# Patient Record
Sex: Male | Born: 1970 | Race: White | Hispanic: No | Marital: Married | Smoking: Never smoker
Health system: Southern US, Community
[De-identification: ages and names within clinical notes are randomized; demographics above are authoritative.]

## PROBLEM LIST (undated history)

## (undated) DIAGNOSIS — E785 Hyperlipidemia, unspecified: Secondary | ICD-10-CM

## (undated) DIAGNOSIS — J301 Allergic rhinitis due to pollen: Secondary | ICD-10-CM

## (undated) DIAGNOSIS — E119 Type 2 diabetes mellitus without complications: Secondary | ICD-10-CM

## (undated) DIAGNOSIS — J302 Other seasonal allergic rhinitis: Secondary | ICD-10-CM

## (undated) DIAGNOSIS — M1990 Unspecified osteoarthritis, unspecified site: Secondary | ICD-10-CM

## (undated) HISTORY — DX: Type 2 diabetes mellitus without complications: E11.9

## (undated) HISTORY — DX: Hyperlipidemia, unspecified: E78.5

## (undated) HISTORY — DX: Unspecified osteoarthritis, unspecified site: M19.90

## (undated) HISTORY — DX: Other seasonal allergic rhinitis: J30.2

## (undated) HISTORY — PX: FINGER SURGERY: SHX640

## (undated) HISTORY — DX: Allergic rhinitis due to pollen: J30.1

---

## 1998-06-09 ENCOUNTER — Encounter: Payer: Self-pay | Admitting: Emergency Medicine

## 1998-06-09 ENCOUNTER — Emergency Department (HOSPITAL_COMMUNITY): Admission: EM | Admit: 1998-06-09 | Discharge: 1998-06-09 | Payer: Self-pay | Admitting: Emergency Medicine

## 2003-04-06 HISTORY — PX: KNEE SURGERY: SHX244

## 2009-08-18 ENCOUNTER — Ambulatory Visit: Payer: Self-pay | Admitting: Family Medicine

## 2009-08-18 DIAGNOSIS — E1169 Type 2 diabetes mellitus with other specified complication: Secondary | ICD-10-CM | POA: Insufficient documentation

## 2009-08-18 DIAGNOSIS — E119 Type 2 diabetes mellitus without complications: Secondary | ICD-10-CM

## 2009-08-18 DIAGNOSIS — E785 Hyperlipidemia, unspecified: Secondary | ICD-10-CM

## 2009-08-18 DIAGNOSIS — E1165 Type 2 diabetes mellitus with hyperglycemia: Secondary | ICD-10-CM | POA: Insufficient documentation

## 2009-08-18 DIAGNOSIS — IMO0002 Reserved for concepts with insufficient information to code with codable children: Secondary | ICD-10-CM | POA: Insufficient documentation

## 2009-08-18 HISTORY — DX: Hyperlipidemia, unspecified: E78.5

## 2009-08-18 HISTORY — DX: Type 2 diabetes mellitus without complications: E11.9

## 2009-08-18 LAB — CONVERTED CEMR LAB: Blood Glucose, Fingerstick: 199

## 2009-08-22 LAB — CONVERTED CEMR LAB
ALT: 28 units/L (ref 0–53)
AST: 22 units/L (ref 0–37)
Albumin: 4.2 g/dL (ref 3.5–5.2)
Alkaline Phosphatase: 54 units/L (ref 39–117)
Bilirubin, Direct: 0.1 mg/dL (ref 0.0–0.3)
Cholesterol: 173 mg/dL (ref 0–200)
Creatinine,U: 175.7 mg/dL
Direct LDL: 95 mg/dL
HDL: 41.3 mg/dL (ref >39.00)
Hgb A1c MFr Bld: 10.1 % — ABNORMAL HIGH (ref 4.6–6.5)
Microalb Creat Ratio: 0.9 mg/g (ref 0.0–30.0)
Microalb, Ur: 1.5 mg/dL (ref 0.0–1.9)
Total Bilirubin: 0.8 mg/dL (ref 0.3–1.2)
Total CHOL/HDL Ratio: 4
Total Protein: 6.9 g/dL (ref 6.0–8.3)
Triglycerides: 300 mg/dL — ABNORMAL HIGH (ref 0.0–149.0)
VLDL: 60 mg/dL — ABNORMAL HIGH (ref 0.0–40.0)

## 2009-08-29 ENCOUNTER — Ambulatory Visit: Payer: Self-pay | Admitting: Family Medicine

## 2009-08-29 DIAGNOSIS — J019 Acute sinusitis, unspecified: Secondary | ICD-10-CM | POA: Insufficient documentation

## 2009-09-15 ENCOUNTER — Telehealth: Payer: Self-pay | Admitting: Family Medicine

## 2009-10-31 ENCOUNTER — Ambulatory Visit: Payer: Self-pay | Admitting: Family Medicine

## 2009-10-31 LAB — CONVERTED CEMR LAB
Cholesterol, target level: 200 mg/dL
HDL goal, serum: 40 mg/dL
Hgb A1c MFr Bld: 9.9 % — ABNORMAL HIGH (ref 4.6–6.5)
LDL Goal: 100 mg/dL

## 2009-11-10 ENCOUNTER — Telehealth: Payer: Self-pay | Admitting: Family Medicine

## 2010-02-02 ENCOUNTER — Ambulatory Visit: Payer: Self-pay | Admitting: Family Medicine

## 2010-03-02 ENCOUNTER — Telehealth: Payer: Self-pay | Admitting: Family Medicine

## 2010-04-21 ENCOUNTER — Encounter: Payer: Self-pay | Admitting: Family Medicine

## 2010-04-28 ENCOUNTER — Encounter: Payer: Self-pay | Admitting: Family Medicine

## 2010-05-02 ENCOUNTER — Encounter: Payer: Self-pay | Admitting: Family Medicine

## 2010-05-07 NOTE — Assessment & Plan Note (Signed)
Summary: SINUSITIS? // RS   Vital Signs:  Patient profile:   40 year old male Temp:     98.8 degrees F oral BP sitting:   120 / 78  (left arm) Cuff size:   regular  Vitals Entered By: Sid Falcon LPN (Aug 29, 2009 1:35 PM) CC: headache, congestion, eye pressure   History of Present Illness: Almost two-week history of intermittent sinus congestion, nasal discharge with recent discoloration of greenish mucus and intermittent headaches. He's had mild cough. No sore throat or fever. Taking Mucinex D without much improvement. History of type 2 diabetes but not monitoring blood sugars regularly.  Allergies (verified): No Known Drug Allergies  Past History:  Past Medical History: Last updated: 08/18/2009 Arthritis Diabetes Type II Hayfever, allergies Hyperlipidemia  Review of Systems      See HPI  Physical Exam  General:  Well-developed,well-nourished,in no acute distress; alert,appropriate and cooperative throughout examination Ears:  External ear exam shows no significant lesions or deformities.  Otoscopic examination reveals clear canals, tympanic membranes are intact bilaterally without bulging, retraction, inflammation or discharge. Hearing is grossly normal bilaterally. Nose:  External nasal examination shows no deformity or inflammation. Nasal mucosa are pink and moist without lesions or exudates. Mouth:  Oral mucosa and oropharynx without lesions or exudates.  Teeth in good repair. Neck:  No deformities, masses, or tenderness noted. Lungs:  Normal respiratory effort, chest expands symmetrically. Lungs are clear to auscultation, no crackles or wheezes. Heart:  Normal rate and regular rhythm. S1 and S2 normal without gallop, murmur, click, rub or other extra sounds.   Impression & Recommendations:  Problem # 1:  SINUSITIS, ACUTE (ICD-461.9) Assessment New  His updated medication list for this problem includes:    Amoxicillin 875 Mg Tabs (Amoxicillin) ..... One by  mouth two times a day for 10 days  Complete Medication List: 1)  Simvastatin 80 Mg Tabs (Simvastatin) .... Once daily 2)  Metformin Hcl 500 Mg Tabs (Metformin hcl) .... Two tabs two times a day with meals 3)  One-a-day Mens Tabs (Multiple vitamin) .... Once daily 4)  Amoxicillin 875 Mg Tabs (Amoxicillin) .... One by mouth two times a day for 10 days  Patient Instructions: 1)  Acute sinusitis symptoms for less than 10 days are not helped by antibiotics. Use warm moist compresses, and over the counter decongestants( only as directed). Call if no improvement in 5-7 days, sooner if increasing pain, fever, or new symptoms.  2)  Please schedule a follow-up appointment in 2 months.  Prescriptions: AMOXICILLIN 875 MG TABS (AMOXICILLIN) one by mouth two times a day for 10 days  #20 x 0   Entered and Authorized by:   Evelena Peat MD   Signed by:   Evelena Peat MD on 08/29/2009   Method used:   Electronically to        [facility]  [phone]* (retail)       [address]       [place], [place]  [address]       Ph: [phone] or [phone]       Fax: [phone]   RxID:   [id number]

## 2010-05-07 NOTE — Progress Notes (Signed)
Summary: refill Glimepiride X 1 year  Phone Note Refill Request Message from:  Patient---live call  Refills Requested: Medication #1:  GLIMEPIRIDE 2 MG TABS once daily send to [facility] [facility]  Initial call taken by: Warnell Forester,  March 02, 2010 9:12 AM    Prescriptions: GLIMEPIRIDE 2 MG TABS (GLIMEPIRIDE) once daily  #90 x 3   Entered by:   Sid Falcon LPN   Authorized by:   Evelena Peat MD   Signed by:   Sid Falcon LPN on 27/25/3664   Method used:   Electronically to        [facility] [address] (retail)       [address], [address]       [place], [place]  [address]       Ph: [phone]       Fax: [phone]   RxID:   [id number]

## 2010-05-07 NOTE — Progress Notes (Signed)
Summary: refill Metformin, pharmacy never received 6/13  Phone Note Refill Request Call back at [phone] Message from:  spouse----live call  Refills Requested: Medication #1:  METFORMIN HCL 500 MG TABS two tabs two times a day with meals pharmacy never received rx, according to wife.  Initial call taken by: Warnell Forester,  November 10, 2009 1:29 PM  Follow-up for Phone Call        Refill sent, wife informed Follow-up by: Sid Falcon LPN,  November 10, 2009 2:49 PM    Prescriptions: METFORMIN HCL 500 MG TABS (METFORMIN HCL) two tabs two times a day with meals  #120 x 3   Entered by:   Sid Falcon LPN   Authorized by:   Evelena Peat MD   Signed by:   Sid Falcon LPN on 47/82/9562   Method used:   Electronically to        [facility]  [phone]* (retail)       [address]       [place], [place]  [address]       Ph: [phone] or [phone]       Fax: [phone]   RxID:   [id number]

## 2010-05-07 NOTE — Assessment & Plan Note (Signed)
Summary: 3 MO/ROV/cb   Vital Signs:  Patient profile:   40 year old male Height:      67.75 inches Weight:      236 pounds Temp:     98.6 degrees F oral BP sitting:   100 / 72  (left arm) Cuff size:   regular  Vitals Entered By: Sid Falcon LPN (February 02, 2010 11:21 AM)  History of Present Illness: Here for follow up Type 2 diabetes.   Meds reviewed.  Hx poor control.  On metformin and Amaryl.  Diabetes Management History:      He has not been enrolled in the "Diabetic Education Program".  He states understanding of dietary principles but he is not following the appropriate diet.  No sensory loss is reported.  Self foot exams are being performed.  He is not checking home blood sugars.  He says that he is not exercising regularly.        Hypoglycemic symptoms are not occurring.  No hyperglycemic symptoms are reported.    Lipid Management History:      Positive NCEP/ATP III risk factors include diabetes and hypertension.  Negative NCEP/ATP III risk factors include male age less than 70 years old, no family history for ischemic heart disease, non-tobacco-user status, no ASHD (atherosclerotic heart disease), no prior stroke/TIA, no peripheral vascular disease, and no history of aortic aneurysm.     Allergies (verified): No Known Drug Allergies  Past History:  Past Medical History: Last updated: 08/18/2009 Arthritis Diabetes Type II Hayfever, allergies Hyperlipidemia  Past Surgical History: Last updated: 08/18/2009 Right knee surgery 2005  Family History: Last updated: 08/18/2009 Family History of Alcoholism/Addiction, Maternal Grandfather Father, prostate cancer, heart disease 5s Mother, high cholesterol, stroke, hypertension, diabetes maternal grandmother, diabetes Aunt, breast cancer, diabetes  Social History: Last updated: 08/18/2009 Occupation: Food Sport and exercise psychologist Married Never Smoked Alcohol use-yes Regular exercise-no Chews tobacco 1 can 2  days  Risk Factors: Exercise: no (08/18/2009)  Risk Factors: Smoking Status: never (08/18/2009)  Review of Systems  The patient denies anorexia, fever, weight loss, chest pain, syncope, dyspnea on exertion, peripheral edema, headaches, and abdominal pain.    Physical Exam  General:  Well-developed,well-nourished,in no acute distress; alert,appropriate and cooperative throughout examination Eyes:  No corneal or conjunctival inflammation noted. EOMI. Perrla. Funduscopic exam benign, without hemorrhages, exudates or papilledema. Vision grossly normal. Ears:  External ear exam shows no significant lesions or deformities.  Otoscopic examination reveals clear canals, tympanic membranes are intact bilaterally without bulging, retraction, inflammation or discharge. Hearing is grossly normal bilaterally. Mouth:  Oral mucosa and oropharynx without lesions or exudates.  Teeth in good repair. Neck:  No deformities, masses, or tenderness noted. Lungs:  Normal respiratory effort, chest expands symmetrically. Lungs are clear to auscultation, no crackles or wheezes. Heart:  Normal rate and regular rhythm. S1 and S2 normal without gallop, murmur, click, rub or other extra sounds. Extremities:  No clubbing, cyanosis, edema, or deformity noted with normal full range of motion of all joints.    Diabetes Management Exam:    Foot Exam (with socks and/or shoes not present):       Sensory-Pinprick/Light touch:          Left medial foot (L-4): normal          Left dorsal foot (L-5): normal          Left lateral foot (S-1): normal          Right medial foot (L-4): normal  Right dorsal foot (L-5): normal          Right lateral foot (S-1): normal       Sensory-Monofilament:          Left foot: normal          Right foot: normal       Inspection:          Left foot: normal          Right foot: normal       Nails:          Left foot: normal          Right foot: normal    Eye Exam:       Eye Exam  done here today          Results: normal   Impression & Recommendations:  Problem # 1:  DIABETES MELLITUS, TYPE II (ICD-250.00)  His updated medication list for this problem includes:    Metformin Hcl 500 Mg Tabs (Metformin hcl) .Marland Kitchen..Marland Kitchen Two tabs two times a day with meals    Glimepiride 2 Mg Tabs (Glimepiride) ..... Once daily  Orders: Specimen Handling ([address]) Venipuncture ([address]) TLB-A1C / Hgb A1C (Glycohemoglobin) (83036-A1C)  Problem # 2:  HYPERLIPIDEMIA (ICD-272.4)  His updated medication list for this problem includes:    Simvastatin 80 Mg Tabs (Simvastatin) ..... Once daily  Complete Medication List: 1)  Simvastatin 80 Mg Tabs (Simvastatin) .... Once daily 2)  Metformin Hcl 500 Mg Tabs (Metformin hcl) .... Two tabs two times a day with meals 3)  One-a-day Mens Tabs (Multiple vitamin) .... Once daily 4)  Glimepiride 2 Mg Tabs (Glimepiride) .... Once daily 5)  Accu-chek Aviva Strp (Glucose blood) .... Use daily as directed  Diabetes Management Assessment/Plan:      The following lipid goals have been established for the patient: Total cholesterol goal of 200; LDL cholesterol goal of 100; HDL cholesterol goal of 40; Triglyceride goal of 150.  His blood pressure goal is < 130/80.    Lipid Assessment/Plan:      Based on NCEP/ATP III, the patient's risk factor category is "history of diabetes".  The patient's lipid goals are as follows: Total cholesterol goal is 200; LDL cholesterol goal is 100; HDL cholesterol goal is 40; Triglyceride goal is 150.    Patient Instructions: 1)  It is important that you exercise reguarly at least 20 minutes 5 times a week. If you develop chest pain, have severe difficulty breathing, or feel very tired, stop exercising immediately and seek medical attention.  2)  You need to lose weight. Consider a lower calorie diet and regular exercise.  3)  Check your blood sugars regularly. If your readings are usually above:  or below 70 you should contact our  office.  4)  It is important that your diabetic A1c level is checked every 3 months.  5)  See your eye doctor yearly to check for diabetic eye damage. 6)  Check your feet each night  for sore areas, calluses or signs of infection.  Prescriptions: ACCU-CHEK AVIVA  STRP (GLUCOSE BLOOD) use daily as directed  #50 x 3   Entered and Authorized by:   Evelena Peat MD   Signed by:   Evelena Peat MD on 02/02/2010   Method used:   [facility] to        [facility] [address] (retail)       [address]       [place]  [place], [place]  [address]       Ph: [phone]       Fax: [phone]   RxID:   [id number] SIMVASTATIN 80 MG TABS (SIMVASTATIN) once daily  #30 x 11   Entered and Authorized by:   Evelena Peat MD   Signed by:   Evelena Peat MD on 02/02/2010   Method used:   [facility] to        [facility] [address] (retail)       [address], [address]       [place], [place]  [address]       Ph: [phone]       Fax: [phone]   RxID:   [id number]    Orders Added: 1)  Specimen Handling [99000] 2)  Venipuncture [36415] 3)  TLB-A1C / Hgb A1C (Glycohemoglobin) [83036-A1C] 4)  Est. Patient Level IV [88416]

## 2010-05-07 NOTE — Assessment & Plan Note (Signed)
Summary: BRAND NEW PT/TO EST/CJR WIFE RSC/NJR   Vital Signs:  Patient profile:   40 year old male Height:      67.75 inches Weight:      231 pounds BMI:     35.51 Temp:     98.0 degrees F oral Pulse rate:   80 / minute Pulse rhythm:   regular Resp:     12 per minute BP sitting:   110 / 82  (left arm) Cuff size:   regular  Vitals Entered By: Sid Falcon LPN (Aug 18, 2009 2:10 PM)  Nutrition Counseling: Patient's BMI is greater than 25 and therefore counseled on weight management options. CC: New to establish Is Patient Diabetic? Yes Did you bring your meter with you today? No CBG Result 199   History of Present Illness: New patient to establish care.  Patient has type 2 diabetes which he states was diagnosed over one year ago. Denies any recent followup. No recent A1c. Does not monitor blood sugars regularly at home. Has some urinary frequency. No polydipsia. Has lost some weight this past year due to his efforts. No recent eye exam.  History of hyperlipidemia treated with simvastatin. No side effects from medication. Compliant with medication.  Also reports prior history of elevated blood pressure but apparently never treated.  Family history significant for father prostate cancer history and coronary artery disease in his 42s. Mother had hyperlipidemia, hypertension, and history of stroke. Mother also has type 2 diabetes.  Patient is married 57-year-old daughter. Nonsmoker. No alcohol use. Works as a Corporate treasurer  Diabetes Management History:      He has not been enrolled in the "Diabetic Education Program".  He states understanding of dietary principles and is following his diet appropriately.  No sensory loss is reported.  Self foot exams are being performed.  He is not checking home blood sugars.  He says that he is not exercising regularly.        Hypoglycemic symptoms are not occurring.  Hyperglycemic symptoms include polyuria.    Preventive  Screening-Counseling & Management  Alcohol-Tobacco     Smoking Status: never  Caffeine-Diet-Exercise     Does Patient Exercise: no  Comments: Chews tobacco, 1 can every 2 days  Allergies (verified): No Known Drug Allergies  Past History:  Past Medical History: Arthritis Diabetes Type II Hayfever, allergies Hyperlipidemia  Past Surgical History: Right knee surgery 2005  Family History: Family History of Alcoholism/Addiction, Maternal Grandfather Father, prostate cancer, heart disease 75s Mother, high cholesterol, stroke, hypertension, diabetes maternal grandmother, diabetes Aunt, breast cancer, diabetes  Social History: Occupation: Public house manager Married Never Smoked Alcohol use-yes Regular exercise-no Chews tobacco 1 can 2 daysSmoking Status:  never Occupation:  employed Does Patient Exercise:  no  Review of Systems  The patient denies anorexia, fever, weight gain, chest pain, syncope, dyspnea on exertion, peripheral edema, prolonged cough, headaches, hemoptysis, abdominal pain, melena, hematochezia, and severe indigestion/heartburn.    Physical Exam  General:  Well-developed,well-nourished,in no acute distress; alert,appropriate and cooperative throughout examination Head:  Normocephalic and atraumatic without obvious abnormalities. No apparent alopecia or balding. Eyes:  No corneal or conjunctival inflammation noted. EOMI. Perrla. Funduscopic exam benign, without hemorrhages, exudates or papilledema. Vision grossly normal. Ears:  External ear exam shows no significant lesions or deformities.  Otoscopic examination reveals clear canals, tympanic membranes are intact bilaterally without bulging, retraction, inflammation or discharge. Hearing is grossly normal bilaterally. Mouth:  Oral mucosa and oropharynx without lesions or  exudates.  Teeth in good repair. Neck:  No deformities, masses, or tenderness noted. Lungs:  Normal respiratory effort, chest  expands symmetrically. Lungs are clear to auscultation, no crackles or wheezes. Heart:  Normal rate and regular rhythm. S1 and S2 normal without gallop, murmur, click, rub or other extra sounds. Extremities:  No clubbing, cyanosis, edema, or deformity noted with normal full range of motion of all joints.   Neurologic:  alert & oriented X3, cranial nerves II-XII intact, and strength normal in all extremities.    Diabetes Management Exam:    Foot Exam (with socks and/or shoes not present):       Sensory-Pinprick/Light touch:          Left medial foot (L-4): normal          Left dorsal foot (L-5): normal          Left lateral foot (S-1): normal          Right medial foot (L-4): normal          Right dorsal foot (L-5): normal          Right lateral foot (S-1): normal       Sensory-Monofilament:          Left foot: normal          Right foot: normal       Inspection:          Left foot: normal          Right foot: normal       Nails:          Left foot: normal          Right foot: normal    Eye Exam:       Eye Exam done here today   Impression & Recommendations:  Problem # 1:  DIABETES MELLITUS, TYPE II (ICD-250.00)  need to assess level of control. Fasting 199 here today.  Needs to establish regular eye exams. His updated medication list for this problem includes:    Metformin Hcl 500 Mg Tabs (Metformin hcl) ..... One tab two times a day with meals  Orders: Glucose, (CBG) (82962) TLB-A1C / Hgb A1C (Glycohemoglobin) (83036-A1C) TLB-Microalbumin/Creat Ratio, Urine (82043-MALB) Venipuncture (04540) Capillary Blood Glucose/CBG (98119)  Problem # 2:  HYPERLIPIDEMIA (ICD-272.4)  His updated medication list for this problem includes:    Simvastatin 80 Mg Tabs (Simvastatin) ..... Once daily  Orders: TLB-Lipid Panel (80061-LIPID) TLB-Hepatic/Liver Function Pnl (80076-HEPATIC) Venipuncture (14782)  Complete Medication List: 1)  Simvastatin 80 Mg Tabs (Simvastatin) .... Once  daily 2)  Metformin Hcl 500 Mg Tabs (Metformin hcl) .... One tab two times a day with meals 3)  One-a-day Mens Tabs (Multiple vitamin) .... Once daily  Patient Instructions: 1)  It is important that you exercise reguarly at least 20 minutes 5 times a week. If you develop chest pain, have severe difficulty breathing, or feel very tired, stop exercising immediately and seek medical attention.  2)  Check your blood sugars regularly. If your readings are usually above: 140 (fasting) or below 70 you should contact our office.  3)  It is important that your diabetic A1c level is checked every 3 months.  4)  See your eye doctor yearly to check for diabetic eye damage. 5)  Check your feet each night  for sore areas, calluses or signs of infection.  6)  Please schedule a follow-up appointment in 3 months .

## 2010-05-07 NOTE — Progress Notes (Signed)
Summary: Pt is out of Metformin from old script. Needs refill  Phone Note Call from Patient Call back at [phone] Cheri   Caller: spouse-Cheri Summary of Call: Pt has been using old script of Metformin from previous physician, Dr Clovis Riley, and is out of medication.  Dr Caryl Never increased the dosage of Metformin, but [facility] [address] will not refill until 09/16/09. Please call [facility] and get med refilled.     Initial call taken by: Lucy Antigua,  September 15, 2009 8:15 AM    Prescriptions: METFORMIN HCL 500 MG TABS (METFORMIN HCL) two tabs two times a day with meals  #120 x 3   Entered by:   Sid Falcon LPN   Authorized by:   Evelena Peat MD   Signed by:   Sid Falcon LPN on 09/81/1914   Method used:   Electronically to        [facility]  [phone]* (retail)       [address]       [place], [place]  [address]       Ph: [phone] or [phone]       Fax: [phone]   RxID:   [id number]

## 2010-05-07 NOTE — Assessment & Plan Note (Signed)
Summary: Follow up/cb   Vital Signs:  Patient profile:   40 year old male Weight:      227 pounds Temp:     98.0 degrees F oral BP sitting:   104 / 70  (left arm)  Vitals Entered By: Sid Falcon LPN (October 31, 2009 10:04 AM) CC: DM follow-up, Lipid Management Is Patient Diabetic? Yes Did you bring your meter with you today? Yes   History of Present Illness: Type 2 diabetes. Checks blood sugars regularly. Fastings still mostly over 200. Occasional thirst but no urine frequency. We increased metformin last visit to 500 mg 2 twice daily. Last A1c was over 10%. Cholesterol adequately controlled but elevated triglycerides.  Patient recently has joined a gym and started exercising regularly. Has lost a few pounds since first visit.  Diabetes Management History:      He has not been enrolled in the "Diabetic Education Program".  He states understanding of dietary principles but he is not following the appropriate diet.  No sensory loss is reported.  Self foot exams are being performed.  He is not checking home blood sugars.  He says that he is not exercising regularly.        Hypoglycemic symptoms are not occurring.  No hyperglycemic symptoms are reported.        There are no symptoms to suggest diabetic complications.  Since his last visit, no infections have occurred.  The following changes have been made to his treatment plan since last visit: medication changes.  Treatment plan changes were initiated by MD.    Lipid Management History:      Positive NCEP/ATP III risk factors include diabetes and hypertension.  Negative NCEP/ATP III risk factors include male age less than 75 years old, non-tobacco-user status, no ASHD (atherosclerotic heart disease), no prior stroke/TIA, no peripheral vascular disease, and no history of aortic aneurysm.     Allergies (verified): No Known Drug Allergies  Past History:  Past Medical History: Last updated: 08/18/2009 Arthritis Diabetes Type  II Hayfever, allergies Hyperlipidemia PMH reviewed for relevance  Review of Systems  The patient denies chest pain, syncope, dyspnea on exertion, peripheral edema, prolonged cough, headaches, hemoptysis, abdominal pain, melena, hematochezia, and severe indigestion/heartburn.    Physical Exam  General:  Well-developed,well-nourished,in no acute distress; alert,appropriate and cooperative throughout examination Mouth:  Oral mucosa and oropharynx without lesions or exudates.  Teeth in good repair. Neck:  No deformities, masses, or tenderness noted. Lungs:  Normal respiratory effort, chest expands symmetrically. Lungs are clear to auscultation, no crackles or wheezes. Heart:  Normal rate and regular rhythm. S1 and S2 normal without gallop, murmur, click, rub or other extra sounds. Extremities:  No clubbing, cyanosis, edema, or deformity noted with normal full range of motion of all joints.     Impression & Recommendations:  Problem # 1:  DIABETES MELLITUS, TYPE II (ICD-250.00) poorly controlled.  Needs to lose more weight.  Education given.  Repeat A1C.   Suspect we will need to add more medication. His updated medication list for this problem includes:    Metformin Hcl 500 Mg Tabs (Metformin hcl) .Marland Kitchen..Marland Kitchen Two tabs two times a day with meals    Glimepiride 2 Mg Tabs (Glimepiride) ..... Once daily  Orders: Venipuncture (16109) Specimen Handling (60454) TLB-A1C / Hgb A1C (Glycohemoglobin) (83036-A1C)  Problem # 2:  HYPERLIPIDEMIA (ICD-272.4) On higher dose simvastatin but no myalgias. His updated medication list for this problem includes:    Simvastatin 80 Mg Tabs (Simvastatin) ..... Once  daily  Complete Medication List: 1)  Simvastatin 80 Mg Tabs (Simvastatin) .... Once daily 2)  Metformin Hcl 500 Mg Tabs (Metformin hcl) .... Two tabs two times a day with meals 3)  One-a-day Mens Tabs (Multiple vitamin) .... Once daily 4)  Amoxicillin 875 Mg Tabs (Amoxicillin) .... One by mouth two  times a day for 10 days 5)  Glimepiride 2 Mg Tabs (Glimepiride) .... Once daily  Diabetes Management Assessment/Plan:      The following lipid goals have been established for the patient: Total cholesterol goal of 200; LDL cholesterol goal of 100; HDL cholesterol goal of 40; Triglyceride goal of 150.    Lipid Assessment/Plan:      Based on NCEP/ATP III, the patient's risk factor category is "history of diabetes".  The patient's lipid goals are as follows: Total cholesterol goal is 200; LDL cholesterol goal is 100; HDL cholesterol goal is 40; Triglyceride goal is 150.    Patient Instructions: 1)  Please schedule a follow-up appointment in 3 months .  2)  It is important that you exercise reguarly at least 20 minutes 5 times a week. If you develop chest pain, have severe difficulty breathing, or feel very tired, stop exercising immediately and seek medical attention.  3)  You need to lose weight. Consider a lower calorie diet and regular exercise.  4)  Check your blood sugars regularly. If your readings are usually above: 180 or below 70 you should contact our office.  5)  It is important that your diabetic A1c level is checked every 3 months.  6)  See your eye doctor yearly to check for diabetic eye damage. 7)  Check your feet each night  for sore areas, calluses or signs of infection.

## 2010-05-08 ENCOUNTER — Ambulatory Visit: Admit: 2010-05-08 | Payer: Self-pay | Admitting: Family Medicine

## 2010-05-08 ENCOUNTER — Ambulatory Visit (INDEPENDENT_AMBULATORY_CARE_PROVIDER_SITE_OTHER): Payer: BC Managed Care – PPO | Admitting: Family Medicine

## 2010-05-08 ENCOUNTER — Encounter: Payer: Self-pay | Admitting: Family Medicine

## 2010-05-08 VITALS — BP 120/76 | HR 72 | Temp 98.7°F | Resp 14 | Ht 71.0 in | Wt 242.0 lb

## 2010-05-08 DIAGNOSIS — E785 Hyperlipidemia, unspecified: Secondary | ICD-10-CM

## 2010-05-08 DIAGNOSIS — E119 Type 2 diabetes mellitus without complications: Secondary | ICD-10-CM

## 2010-05-08 LAB — HEPATIC FUNCTION PANEL
Albumin: 4.1 g/dL (ref 3.5–5.2)
Total Bilirubin: 0.3 mg/dL (ref 0.3–1.2)

## 2010-05-08 LAB — LIPID PANEL
HDL: 41.3 mg/dL (ref >39.00)
Total CHOL/HDL Ratio: 4
Triglycerides: 162 mg/dL — ABNORMAL HIGH (ref 0.0–149.0)
VLDL: 32.4 mg/dL (ref 0.0–40.0)

## 2010-05-08 NOTE — Progress Notes (Signed)
  Subjective:    Patient ID: Richard Carroll, male    DOB: 18-Nov-1970, 40 y.o.   MRN: [id number]  HPI  Patient seen for medical followup. Type 2 diabetes which has been well controlled. Most recent A1c 6.9%. No symptoms of hyper or hypoglycemia. Patient takes metformin and Amaryl. Compliant with medications. Poor compliance with diet and exercise. Slight weight gain since last visit. No blurred vision. No foot problems.  History of hyperlipidemia. No history of CAD or peripheral vascular disease. Takes high-dose simvastatin but no side effects such as myalgias. Repeat lipids Good understanding of problematic foods for hyperlipidemia  Review of Systems  Constitutional: Positive for fatigue. Negative for activity change and appetite change.  HENT: Negative for hearing loss and neck pain.   Eyes: Negative for pain and visual disturbance.  Respiratory: Negative for cough, shortness of breath and wheezing.   Cardiovascular: Negative for chest pain and palpitations.  Gastrointestinal: Negative for abdominal pain.  Genitourinary: Negative for dysuria and flank pain.  Musculoskeletal: Negative for back pain and arthralgias.  Skin: Negative for rash and wound.  Neurological: Negative for dizziness, syncope and weakness.  Hematological: Negative for adenopathy.  Psychiatric/Behavioral: Negative for confusion.       Objective:   Physical Exam  Constitutional: He is oriented to person, place, and time. He appears well-developed and well-nourished.  HENT:  Head: Normocephalic and atraumatic.  Eyes: Pupils are equal, round, and reactive to light. Right eye exhibits no discharge. Left eye exhibits no discharge.  Neck: No thyromegaly present.  Cardiovascular: Normal rate, regular rhythm and normal heart sounds.  Exam reveals no gallop.   No murmur heard. Pulmonary/Chest: No respiratory distress. He has no wheezes. He has no rales.  Abdominal: He exhibits no distension. There is no tenderness.  There is no rebound and no guarding.  Musculoskeletal: He exhibits no edema.  Lymphadenopathy:    He has no cervical adenopathy.  Neurological: He is alert and oriented to person, place, and time. No cranial nerve deficit.  Skin: No rash noted.  Psychiatric: He has a normal mood and affect. His behavior is normal.          Assessment & Plan:  #1 Type 2 diabetes with history of good control. Compliance with diet and exercise. Patient tells work on weight loss and more consistent exercise. Repeat A1c  #2   hyperlipidemia repeat lipids and hepatic panel. Consider reducing simvastatin 40 mg daily for lipids adequately controlled

## 2010-05-12 ENCOUNTER — Telehealth: Payer: Self-pay | Admitting: *Deleted

## 2010-05-12 NOTE — Telephone Encounter (Signed)
Message copied by Sid Falcon on Tue May 12, 2010  3:40 PM ------      Message from: Trinna Post      Created: Caleen Essex May 08, 2010  5:16 PM       Lipids OK.      A1C is up just slightly.            Work on weight loss.      Reduce Simvastatin to one half tablet daily.            We had discussed 6 months f/u but I would rec 3 months

## 2010-05-12 NOTE — Telephone Encounter (Signed)
Pt wife informed

## 2010-07-08 ENCOUNTER — Telehealth: Payer: Self-pay | Admitting: Family Medicine

## 2010-07-08 MED ORDER — METFORMIN HCL 500 MG PO TABS: 1000.0000 mg | ORAL_TABLET | Freq: Two times a day (BID) | ORAL | Status: DC

## 2010-07-08 NOTE — Telephone Encounter (Signed)
Pt is changing pharmacies from [facility] in [place], to [facility] on [address].  Pt is needing a script for Metformin 1000 mg 2 x day with meals, sent to [facility] [address]. Pt is out of med.

## 2010-07-08 NOTE — Telephone Encounter (Signed)
Rx sent to new pharmacy for 1 year

## 2010-08-17 ENCOUNTER — Encounter: Payer: Self-pay | Admitting: Family Medicine

## 2010-08-17 ENCOUNTER — Ambulatory Visit (INDEPENDENT_AMBULATORY_CARE_PROVIDER_SITE_OTHER): Payer: BC Managed Care – PPO | Admitting: Family Medicine

## 2010-08-17 VITALS — BP 120/80 | Temp 98.9°F | Wt 244.0 lb

## 2010-08-17 DIAGNOSIS — E119 Type 2 diabetes mellitus without complications: Secondary | ICD-10-CM

## 2010-08-17 DIAGNOSIS — E785 Hyperlipidemia, unspecified: Secondary | ICD-10-CM

## 2010-08-17 LAB — MICROALBUMIN / CREATININE URINE RATIO
Creatinine,U: 122 mg/dL
Microalb Creat Ratio: 3.6 mg/g (ref 0.0–30.0)

## 2010-08-17 LAB — HEMOGLOBIN A1C: Hgb A1c MFr Bld: 7.3 % — ABNORMAL HIGH (ref 4.6–6.5)

## 2010-08-17 NOTE — Progress Notes (Signed)
  Subjective:    Patient ID: Richard Carroll, male    DOB: 1970/12/12, 40 y.o.   MRN: [id number]  HPI Patient is seen for three-month followup. Type 2 diabetes. Recent A1c 7.1%. Poor compliance with exercise and diet.  Mild weight gain since last visit. Medications reviewed. No hypoglycemia. No symptoms of hyperglycemia.  Hyperlipidemia treated with simvastatin 40 mg daily. No myalgias. No recent chest pains. No dyspnea.   Review of Systems  Constitutional: Negative for fever, activity change, appetite change and fatigue.  HENT: Negative for ear pain, congestion and trouble swallowing.   Eyes: Negative for pain and visual disturbance.  Respiratory: Negative for cough, shortness of breath and wheezing.   Cardiovascular: Negative for chest pain and palpitations.  Gastrointestinal: Negative for nausea, vomiting, abdominal pain, diarrhea, constipation, blood in stool, abdominal distention and rectal pain.  Genitourinary: Negative for dysuria, hematuria and testicular pain.  Musculoskeletal: Negative for joint swelling and arthralgias.  Skin: Negative for rash.  Neurological: Negative for dizziness, syncope and headaches.  Hematological: Negative for adenopathy.  Psychiatric/Behavioral: Negative for confusion and dysphoric mood.       Objective:   Physical Exam  Constitutional: He is oriented to person, place, and time. He appears well-developed and well-nourished.  HENT:  Right Ear: External ear normal.  Left Ear: External ear normal.  Mouth/Throat: Oropharynx is clear and moist. No oropharyngeal exudate.  Eyes: Pupils are equal, round, and reactive to light.  Neck: No thyromegaly present.  Cardiovascular: Normal rate, regular rhythm and normal heart sounds.   Pulmonary/Chest: Effort normal and breath sounds normal. No respiratory distress. He has no wheezes. He has no rales.  Musculoskeletal: He exhibits no edema.       Feet reveal no lesions. Normal sensory function    Lymphadenopathy:    He has no cervical adenopathy.  Neurological: He is alert and oriented to person, place, and time.  Skin: No rash noted.          Assessment & Plan:  #1 type 2 diabetes. Marginal control. Repeat A1c. Urine microalbumin screen. Needs to work on weight loss and more exercise. #2 hyperlipidemia. Continue simvastatin.

## 2010-08-17 NOTE — Patient Instructions (Signed)
Work on weight loss. Exercise more consistently.

## 2010-08-18 NOTE — Progress Notes (Signed)
Quick Note:  Pt informed on VM ______ 

## 2010-11-06 ENCOUNTER — Ambulatory Visit: Payer: BC Managed Care – PPO | Admitting: Family Medicine

## 2010-12-18 ENCOUNTER — Ambulatory Visit (INDEPENDENT_AMBULATORY_CARE_PROVIDER_SITE_OTHER): Payer: BC Managed Care – PPO | Admitting: Family Medicine

## 2010-12-18 ENCOUNTER — Encounter: Payer: Self-pay | Admitting: Family Medicine

## 2010-12-18 ENCOUNTER — Ambulatory Visit: Payer: BC Managed Care – PPO | Admitting: Family Medicine

## 2010-12-18 DIAGNOSIS — E785 Hyperlipidemia, unspecified: Secondary | ICD-10-CM

## 2010-12-18 DIAGNOSIS — M25559 Pain in unspecified hip: Secondary | ICD-10-CM

## 2010-12-18 DIAGNOSIS — J309 Allergic rhinitis, unspecified: Secondary | ICD-10-CM

## 2010-12-18 DIAGNOSIS — E119 Type 2 diabetes mellitus without complications: Secondary | ICD-10-CM

## 2010-12-18 MED ORDER — AZELASTINE HCL 0.1 % NA SOLN: 1.0000 | Freq: Two times a day (BID) | NASAL | Status: DC

## 2010-12-18 NOTE — Progress Notes (Signed)
  Subjective:    Patient ID: Richard Carroll, male    DOB: [birth year], 40 y.o.   MRN: [id number]  HPI Patient here for evaluation of the following.  Type 2 diabetes. Check blood sugars inconsistently. Last A1c 7.3%. His lost 5 pounds since then. Inconsistent exercise. Has made some dietary changes. Compliant with medications. No hypoglycemia. No symptoms of hyperglycemia. Recent urine microalbumin negative  Frequent nasal congestion. He has frequent sneezing and nasal stuffiness. Recently tried Claritin and Mucinex without much improvement. No fever or chills. Symptoms worse in the fall and probable ragweed allergies  Several week history of right anterior hip pain. No injury. Worse with hip flexion. No visible swelling or ecchymosis. No alleviating factors but is not taking any medications. Has not tried any ice. No weakness. No associated back pain.  Hyperlipidemia treated with Zocor. Reduced from 80 mg to 40 mg several months ago.   Review of Systems  Constitutional: Negative for fever and chills.  HENT: Positive for congestion, sneezing and sinus pressure.   Respiratory: Negative for cough and shortness of breath.   Cardiovascular: Negative for chest pain, palpitations and leg swelling.  Gastrointestinal: Negative for abdominal pain.  Musculoskeletal: Negative for back pain, joint swelling, arthralgias and gait problem.  Skin: Negative for rash.  Neurological: Negative for dizziness.       Objective:   Physical Exam  Constitutional: He appears well-developed and well-nourished.  HENT:  Mouth/Throat: Oropharynx is clear and moist.       Clear nasal mucus bilaterally  Neck: Neck supple.  Cardiovascular: Normal rate, regular rhythm and normal heart sounds.   No murmur heard. Pulmonary/Chest: Effort normal and breath sounds normal. No respiratory distress. He has no wheezes. He has no rales.  Musculoskeletal: He exhibits no edema.       Full range of motion right hip. Tender  proximal right sartorius. Pain with right hip flexion but no weakness.  Lymphadenopathy:    He has no cervical adenopathy.  Neurological:       Deep tendon reflexes symmetric lower extremities. No weakness.          Assessment & Plan:  #1 type 2 diabetes. Suboptimal control by history. Recheck A1c today. Continue weight loss. Fasting glucose today 110 #2 hyperlipidemia.  Reduced simvastatin to 40 mg daily several months ago. Recheck lipids and hepatic #3 probable seasonal allergic rhinitis. Add Astelin nasal 1-2 sprays per nostril twice daily as needed #4 right anterior hip pain. Suspect sartorius strain versus tendinitis. Icing and over-the-counter anti-inflammatories and touch base 2-3 weeks if no better

## 2010-12-18 NOTE — Patient Instructions (Signed)
Try icing to R hip area 2-3 times daily for 20-30 minutes. Consider advil or aleve and be in touch if pain no better in 2-3 weeks.

## 2010-12-19 LAB — LIPID PANEL
Cholesterol: 150 mg/dL (ref 0–200)
VLDL: 24 mg/dL (ref 0–40)

## 2010-12-19 LAB — HEPATIC FUNCTION PANEL
ALT: 22 U/L (ref 0–53)
AST: 18 U/L (ref 0–37)
Albumin: 4.3 g/dL (ref 3.5–5.2)

## 2011-02-11 ENCOUNTER — Other Ambulatory Visit: Payer: Self-pay | Admitting: Family Medicine

## 2011-02-15 ENCOUNTER — Other Ambulatory Visit: Payer: Self-pay | Admitting: Family Medicine

## 2011-03-09 ENCOUNTER — Other Ambulatory Visit: Payer: Self-pay | Admitting: Family Medicine

## 2011-04-19 ENCOUNTER — Encounter: Payer: Self-pay | Admitting: Family Medicine

## 2011-04-19 ENCOUNTER — Ambulatory Visit (INDEPENDENT_AMBULATORY_CARE_PROVIDER_SITE_OTHER): Payer: BC Managed Care – PPO | Admitting: Family Medicine

## 2011-04-19 VITALS — BP 130/90 | HR 60 | Temp 98.5°F | Wt 245.0 lb

## 2011-04-19 DIAGNOSIS — E119 Type 2 diabetes mellitus without complications: Secondary | ICD-10-CM

## 2011-04-19 NOTE — Progress Notes (Signed)
  Subjective:    Patient ID: Richard Carroll, male    DOB: February 07, 1971, 41 y.o.   MRN: [id number]  HPI  Medical followup. Patient has history of type 2 diabetes and hyperlipidemia. Recent lab work A1c 7.0%. Fasting blood sugars around 160. He has gained some weight over the holidays. Remains on metformin and Amaryl. No hypoglycemia. Poor compliance with diet and exercise. No myalgias.   Review of Systems  Constitutional: Negative for fever and chills.  Respiratory: Negative for cough and shortness of breath.   Cardiovascular: Negative for chest pain, palpitations and leg swelling.  Genitourinary: Negative for dysuria.  Neurological: Negative for dizziness and headaches.       Objective:   Physical Exam  Constitutional: He appears well-developed and well-nourished.  Neck: Neck supple. No thyromegaly present.  Cardiovascular: Normal rate and regular rhythm.   Pulmonary/Chest: Effort normal and breath sounds normal. No respiratory distress. He has no wheezes. He has no rales.  Musculoskeletal: He exhibits no edema.  Lymphadenopathy:    He has no cervical adenopathy.          Assessment & Plan:  Type 2 diabetes. History of marginal control. Needs to lose some weight. Discussed weight loss strategies. Recheck A1c.

## 2011-04-19 NOTE — Patient Instructions (Signed)
Work on weight loss and sugar/starch reduction.

## 2011-04-22 NOTE — Progress Notes (Signed)
Quick Note:  Pt informed in home, cell, work VM ______

## 2011-08-18 ENCOUNTER — Encounter: Payer: Self-pay | Admitting: Family Medicine

## 2011-08-18 ENCOUNTER — Ambulatory Visit (INDEPENDENT_AMBULATORY_CARE_PROVIDER_SITE_OTHER): Payer: BC Managed Care – PPO | Admitting: Family Medicine

## 2011-08-18 VITALS — BP 100/80 | Temp 98.7°F | Wt 238.0 lb

## 2011-08-18 DIAGNOSIS — E119 Type 2 diabetes mellitus without complications: Secondary | ICD-10-CM

## 2011-08-18 NOTE — Progress Notes (Signed)
  Subjective:    Patient ID: Richard Carroll, male    DOB: [birth year], 41 y.o.   MRN: [id number]  HPI  Followup type 2 diabetes. Patient made some lifestyle changes since last visit. More active with walking. Has made dietary changes. For example, reduction of sodas. Last A1c 7.3% in January. Not checking blood sugars regularly. Remains on Amaryl and metformin. Is also taking simvastatin for hyperlipidemia. Previous lipids at goal. He denies any chest pain. No dyspnea. No symptoms of hyper or hypoglycemia. Nonsmoker. No neuropathy symptoms. No visual changes.  Past Medical History  Diagnosis Date  . Arthritis   . Diabetes mellitus type II   . Hay fever     with allergies  . Hyperlipidemia   . DIABETES MELLITUS, TYPE II 08/18/2009  . HYPERLIPIDEMIA 08/18/2009   Past Surgical History  Procedure Date  . Knee surgery 2005    r. knee    reports that he has never smoked. His smokeless tobacco use includes Chew. He reports that he drinks alcohol. He reports that he does not use illicit drugs. family history includes Alcohol abuse in his maternal grandfather; Diabetes in his maternal grandmother and mother; Heart disease in his father; Hyperlipidemia in his mother; Hypertension in his mother; Prostate cancer in his father; and Stroke in his mother. No Known Allergies    Review of Systems  Constitutional: Negative for fever and appetite change.  Eyes: Negative for visual disturbance.  Respiratory: Negative for cough and shortness of breath.   Neurological: Negative for dizziness.       Objective:   Physical Exam  Constitutional: He appears well-developed and well-nourished. No distress.  Cardiovascular: Normal rate and regular rhythm.   Pulmonary/Chest: Effort normal and breath sounds normal. No respiratory distress. He has no wheezes. He has no rales.  Musculoskeletal:       Feet reveal no skin lesions. Good distal foot pulses. Good capillary refill. No calluses. Normal sensation  with monofilament testing           Assessment & Plan:  Type 2 diabetes. History of marginal control. Recheck A1c. Hopefully will be improved with recent weight loss. Routine followup 4 months.  Reminder for regular eye exams.

## 2011-08-20 NOTE — Progress Notes (Signed)
Quick Note:  Pt informed on VM ______ 

## 2011-12-16 ENCOUNTER — Encounter: Payer: Self-pay | Admitting: Family Medicine

## 2011-12-16 ENCOUNTER — Ambulatory Visit (INDEPENDENT_AMBULATORY_CARE_PROVIDER_SITE_OTHER): Payer: BC Managed Care – PPO | Admitting: Family Medicine

## 2011-12-16 VITALS — BP 110/78 | Temp 98.5°F | Wt 236.0 lb

## 2011-12-16 DIAGNOSIS — E785 Hyperlipidemia, unspecified: Secondary | ICD-10-CM

## 2011-12-16 DIAGNOSIS — E119 Type 2 diabetes mellitus without complications: Secondary | ICD-10-CM

## 2011-12-16 NOTE — Progress Notes (Signed)
  Subjective:    Patient ID: Richard Carroll, male    DOB: 1971-04-01, 41 y.o.   MRN: [id number]  HPI  Patient seen for followup type 2 diabetes and hyperlipidemia. Takes Amaryl 2 mg daily and metformin. CBGs fasting around 130-160 range. No symptoms of hyperglycemia. Last A1c 7.1%. He has lost a couple more pounds of weight by his scale. Lipids treated with simvastatin. No lipids in over one year. No recent chest pains. No dyspnea. He has not had eye exam in the past year.  Review of Systems  Constitutional: Negative for fatigue.  Eyes: Negative for visual disturbance.  Respiratory: Negative for cough, chest tightness and shortness of breath.   Cardiovascular: Negative for chest pain, palpitations and leg swelling.  Neurological: Negative for dizziness, syncope, weakness, light-headedness and headaches.       Objective:   Physical Exam  Constitutional: He appears well-developed and well-nourished.  Neck: Neck supple. No thyromegaly present.  Cardiovascular: Normal rate and regular rhythm.  Exam reveals no gallop.   No murmur heard. Pulmonary/Chest: Effort normal and breath sounds normal. No respiratory distress. He has no wheezes. He has no rales.  Musculoskeletal: He exhibits no edema.  Lymphadenopathy:    He has no cervical adenopathy.          Assessment & Plan:  #1 type 2 diabetes. History of fair control. Recheck A1c. Check urine microalbumin screening. Encouraged to set up eye exam. Continue weight loss efforts #2 hyperlipidemia. Check lipid and hepatic panel

## 2011-12-17 LAB — LIPID PANEL
Cholesterol: 159 mg/dL (ref 0–200)
HDL: 39.5 mg/dL (ref >39.00)
LDL Cholesterol: 93 mg/dL (ref 0–99)
VLDL: 26.6 mg/dL (ref 0.0–40.0)

## 2011-12-17 LAB — MICROALBUMIN / CREATININE URINE RATIO
Creatinine,U: 207.2 mg/dL
Microalb, Ur: 0.8 mg/dL (ref 0.0–1.9)

## 2011-12-17 LAB — HEMOGLOBIN A1C: Hgb A1c MFr Bld: 7.1 % — ABNORMAL HIGH (ref 4.6–6.5)

## 2011-12-17 LAB — HEPATIC FUNCTION PANEL
ALT: 22 U/L (ref 0–53)
Bilirubin, Direct: 0 mg/dL (ref 0.0–0.3)
Total Protein: 7.2 g/dL (ref 6.0–8.3)

## 2011-12-20 NOTE — Progress Notes (Signed)
Quick Note:  Left a message for pt to return call. ______ 

## 2011-12-21 NOTE — Progress Notes (Signed)
Quick Note:  Pt informed on VM ______ 

## 2012-02-04 ENCOUNTER — Other Ambulatory Visit: Payer: Self-pay | Admitting: Family Medicine

## 2012-03-10 ENCOUNTER — Other Ambulatory Visit: Payer: Self-pay | Admitting: Family Medicine

## 2012-03-20 ENCOUNTER — Other Ambulatory Visit: Payer: Self-pay | Admitting: Family Medicine

## 2012-04-17 ENCOUNTER — Other Ambulatory Visit: Payer: Self-pay | Admitting: Family Medicine

## 2012-06-14 ENCOUNTER — Ambulatory Visit (INDEPENDENT_AMBULATORY_CARE_PROVIDER_SITE_OTHER): Payer: BC Managed Care – PPO | Admitting: Family Medicine

## 2012-06-14 ENCOUNTER — Encounter: Payer: Self-pay | Admitting: Family Medicine

## 2012-06-14 VITALS — BP 120/80 | Temp 98.6°F | Wt 235.0 lb

## 2012-06-14 DIAGNOSIS — E119 Type 2 diabetes mellitus without complications: Secondary | ICD-10-CM

## 2012-06-14 LAB — HM DIABETES FOOT EXAM: HM Diabetic Foot Exam: NORMAL

## 2012-06-14 NOTE — Patient Instructions (Addendum)
Continue weight loss efforts.

## 2012-06-14 NOTE — Progress Notes (Signed)
  Subjective:    Patient ID: Richard Carroll, male    DOB: [birth year], 42 y.o.   MRN: [id number]  HPI Followup type 2 diabetes. Treated with metformin and Amaryl. Not monitoring blood sugars regularly. A1c's have been stable around 7. No significant weight change since last visit. No consistent exercise. No dietary changes. No symptoms of hyperglycemia. Needs followup eye exam. Urine microalbumin last visit negative. Lipids treated with simvastatin and at goal  Past Medical History  Diagnosis Date  . Arthritis   . Diabetes mellitus type II   . Hay fever     with allergies  . Hyperlipidemia   . DIABETES MELLITUS, TYPE II 08/18/2009  . HYPERLIPIDEMIA 08/18/2009   Past Surgical History  Procedure Laterality Date  . Knee surgery  2005    r. knee    reports that he has never smoked. His smokeless tobacco use includes Chew. He reports that  drinks alcohol. He reports that he does not use illicit drugs. family history includes Alcohol abuse in his maternal grandfather; Diabetes in his maternal grandmother and mother; Heart disease in his father; Hyperlipidemia in his mother; Hypertension in his mother; Prostate cancer in his father; and Stroke in his mother. No Known Allergies    Review of Systems  Constitutional: Negative for fatigue.  Eyes: Negative for visual disturbance.  Respiratory: Negative for cough, chest tightness and shortness of breath.   Cardiovascular: Negative for chest pain, palpitations and leg swelling.  Neurological: Negative for dizziness, syncope, weakness, light-headedness and headaches.       Objective:   Physical Exam  Constitutional: He appears well-developed and well-nourished.  Neck: Neck supple. No thyromegaly present.  Cardiovascular: Normal rate and regular rhythm.   Pulmonary/Chest: Effort normal and breath sounds normal. No respiratory distress. He has no wheezes. He has no rales.  Skin:  Feet reveal no skin lesions. Good distal foot pulses. Good  capillary refill. No calluses. Normal sensation with monofilament testing           Assessment & Plan:  Type 2 diabetes. History of marginal control. Recheck A1c. Strongly advocate regular exercise and losing some weight. Reassess 6 months.  He is encouraged to set up eye exam

## 2012-06-16 ENCOUNTER — Other Ambulatory Visit: Payer: Self-pay | Admitting: *Deleted

## 2012-06-16 DIAGNOSIS — E119 Type 2 diabetes mellitus without complications: Secondary | ICD-10-CM

## 2012-06-16 MED ORDER — GLIMEPIRIDE 4 MG PO TABS: 4.0000 mg | ORAL_TABLET | Freq: Every day | ORAL | Status: DC

## 2012-06-16 NOTE — Progress Notes (Signed)
Quick Note:  Pt informed, lab ordered ______

## 2012-07-31 ENCOUNTER — Encounter: Payer: Self-pay | Admitting: Family Medicine

## 2012-07-31 ENCOUNTER — Ambulatory Visit (INDEPENDENT_AMBULATORY_CARE_PROVIDER_SITE_OTHER): Payer: BC Managed Care – PPO | Admitting: Family Medicine

## 2012-07-31 VITALS — BP 120/70 | Temp 98.8°F

## 2012-07-31 DIAGNOSIS — L84 Corns and callosities: Secondary | ICD-10-CM

## 2012-07-31 DIAGNOSIS — E119 Type 2 diabetes mellitus without complications: Secondary | ICD-10-CM

## 2012-07-31 MED ORDER — DESOXIMETASONE 0.25 % EX CREA: TOPICAL_CREAM | Freq: Two times a day (BID) | CUTANEOUS | Status: DC

## 2012-07-31 MED ORDER — AZELASTINE HCL 0.1 % NA SOLN: 2.0000 | Freq: Two times a day (BID) | NASAL | Status: DC | PRN

## 2012-07-31 NOTE — Progress Notes (Signed)
  Subjective:    Patient ID: Richard Carroll, male    DOB: 1970-07-31, 42 y.o.   MRN: [id number]  HPI Patient presents with sore area left foot Type II diabetic. Recent A1c 7.5%. No history of neuropathy. Patient noticed linear fissure ball of left foot few weeks ago. This caused some moderate to severe pain over the weekend but appears to be healing. He has not noted any redness, drainage, or any fever or chills. His wife gave him some type of moisturizing lotion which seems to be helping somewhat.  Past Medical History  Diagnosis Date  . Arthritis   . Diabetes mellitus type II   . Hay fever     with allergies  . Hyperlipidemia   . DIABETES MELLITUS, TYPE II 08/18/2009  . HYPERLIPIDEMIA 08/18/2009   Past Surgical History  Procedure Laterality Date  . Knee surgery  2005    r. knee    reports that he has never smoked. His smokeless tobacco use includes Chew. He reports that  drinks alcohol. He reports that he does not use illicit drugs. family history includes Alcohol abuse in his maternal grandfather; Diabetes in his maternal grandmother and mother; Heart disease in his father; Hyperlipidemia in his mother; Hypertension in his mother; Prostate cancer in his father; and Stroke in his mother. No Known Allergies    Review of Systems  Constitutional: Negative for fever and chills.  Skin: Negative for rash.       Objective:   Physical Exam  Constitutional: He appears well-developed and well-nourished.  Cardiovascular: Normal rate and regular rhythm.   Pulmonary/Chest: Effort normal and breath sounds normal. No respiratory distress. He has no wheezes. He has no rales.  Skin:  Left foot ball of foot reveals callused area. He has superficial linear fissure near the center. There is no drainage. No warmth. No erythema. Nontender          Assessment & Plan:  Callus left foot with superficial linear fissure without signs of infection. Topicort 0.25% cream twice daily and  continue moisturizing lotion. Followup immediately for any signs of secondary infection

## 2012-07-31 NOTE — Patient Instructions (Addendum)
Follow up promptly for any signs of infection such as redness, warmth, fever, or increased pain.

## 2012-09-14 ENCOUNTER — Encounter: Payer: Self-pay | Admitting: Family Medicine

## 2012-09-14 ENCOUNTER — Ambulatory Visit (INDEPENDENT_AMBULATORY_CARE_PROVIDER_SITE_OTHER): Payer: BC Managed Care – PPO | Admitting: Family Medicine

## 2012-09-14 VITALS — BP 110/72 | Temp 98.5°F

## 2012-09-14 DIAGNOSIS — J209 Acute bronchitis, unspecified: Secondary | ICD-10-CM

## 2012-09-14 MED ORDER — AZITHROMYCIN 250 MG PO TABS: ORAL_TABLET | ORAL | Status: AC

## 2012-09-14 MED ORDER — HYDROCODONE-HOMATROPINE 5-1.5 MG/5ML PO SYRP: 5.0000 mL | ORAL_SOLUTION | Freq: Four times a day (QID) | ORAL | Status: AC | PRN

## 2012-09-14 NOTE — Patient Instructions (Addendum)

## 2012-09-14 NOTE — Progress Notes (Signed)
  Subjective:    Patient ID: Richard Carroll, male    DOB: February 21, 1971, 42 y.o.   MRN: [id number]  HPI  Acute visit Onset about 5 days ago sinus congestion, fatigue, productive cough, progressive maxillary facial pain. Denies any fever or chills. Has taken Mucinex over-the-counter without much relief. Patient has moderate sore throat which is slightly improved with over-the-counter analgesics He denies any nausea, vomiting, or diarrhea. Cough is especially bothersome at night and not relieved with over-the-counter medications  Past Medical History  Diagnosis Date  . Arthritis   . Diabetes mellitus type II   . Hay fever     with allergies  . Hyperlipidemia   . DIABETES MELLITUS, TYPE II 08/18/2009  . HYPERLIPIDEMIA 08/18/2009   Past Surgical History  Procedure Laterality Date  . Knee surgery  2005    r. knee    reports that he has never smoked. His smokeless tobacco use includes Chew. He reports that  drinks alcohol. He reports that he does not use illicit drugs. family history includes Alcohol abuse in his maternal grandfather; Diabetes in his maternal grandmother and mother; Heart disease in his father; Hyperlipidemia in his mother; Hypertension in his mother; Prostate cancer in his father; and Stroke in his mother. No Known Allergies'   Review of Systems  Constitutional: Positive for fatigue. Negative for fever and chills.  HENT: Positive for congestion and sore throat.   Respiratory: Positive for cough. Negative for shortness of breath and wheezing.   Neurological: Negative for headaches.       Objective:   Physical Exam  Constitutional: He appears well-developed and well-nourished.  HENT:  Right Ear: External ear normal.  Left Ear: External ear normal.  Mouth/Throat: Oropharynx is clear and moist.  Neck: Neck supple.  Cardiovascular: Normal rate and regular rhythm.   Pulmonary/Chest: Effort normal and breath sounds normal. No respiratory distress. He has no  wheezes. He has no rales.  Lymphadenopathy:    He has no cervical adenopathy.          Assessment & Plan:  Acute bronchitis. Suspect viral. Hycodan cough syrup for nighttime use as needed. We've not recommended any antibiotics at this point but if he has any fever or worsening symptoms into next week consider five-day course of Zithromax.  He is getting ready to leave on vacation.   We wrote for prescription which he will not fill unless symptoms worsen

## 2012-09-19 ENCOUNTER — Other Ambulatory Visit (INDEPENDENT_AMBULATORY_CARE_PROVIDER_SITE_OTHER): Payer: BC Managed Care – PPO

## 2012-09-19 DIAGNOSIS — E119 Type 2 diabetes mellitus without complications: Secondary | ICD-10-CM

## 2012-10-23 LAB — HEMOGLOBIN A1C: Hgb A1c MFr Bld: 7.1 % — AB (ref 4.0–6.0)

## 2012-10-27 ENCOUNTER — Encounter: Payer: Self-pay | Admitting: Family Medicine

## 2012-12-15 ENCOUNTER — Encounter: Payer: Self-pay | Admitting: Family Medicine

## 2012-12-15 ENCOUNTER — Ambulatory Visit (INDEPENDENT_AMBULATORY_CARE_PROVIDER_SITE_OTHER): Payer: BC Managed Care – PPO | Admitting: Family Medicine

## 2012-12-15 VITALS — BP 128/82 | HR 97 | Temp 98.5°F | Wt 235.0 lb

## 2012-12-15 DIAGNOSIS — L309 Dermatitis, unspecified: Secondary | ICD-10-CM

## 2012-12-15 DIAGNOSIS — L259 Unspecified contact dermatitis, unspecified cause: Secondary | ICD-10-CM

## 2012-12-15 DIAGNOSIS — E119 Type 2 diabetes mellitus without complications: Secondary | ICD-10-CM

## 2012-12-15 DIAGNOSIS — E785 Hyperlipidemia, unspecified: Secondary | ICD-10-CM

## 2012-12-15 LAB — HEPATIC FUNCTION PANEL
ALT: 21 U/L (ref 0–53)
AST: 18 U/L (ref 0–37)
Bilirubin, Direct: 0.1 mg/dL (ref 0.0–0.3)
Total Protein: 6.9 g/dL (ref 6.0–8.3)

## 2012-12-15 LAB — LIPID PANEL
Cholesterol: 135 mg/dL (ref 0–200)
Total CHOL/HDL Ratio: 3.8 Ratio
Triglycerides: 148 mg/dL (ref <150)

## 2012-12-15 MED ORDER — DESOXIMETASONE 0.25 % EX CREA: TOPICAL_CREAM | Freq: Two times a day (BID) | CUTANEOUS | Status: DC

## 2012-12-15 MED ORDER — GLIMEPIRIDE 4 MG PO TABS: 4.0000 mg | ORAL_TABLET | Freq: Every day | ORAL | Status: DC

## 2012-12-15 NOTE — Progress Notes (Signed)
  Subjective:    Patient ID: Richard Carroll, male    DOB: 1971/03/21, 42 y.o.   MRN: [id number]  HPI  Medical followup Type 2 diabetes. Currently taking metformin and Amaryl. Rarely checks blood sugars. Recent A1c 7.1%. No symptoms of polydipsia or polyuria. Recent eye exam unremarkable. No consistent exercise. Dietary compliance is fair. Weight is unchanged.  Hyperlipidemia treated with simvastatin 40 mg daily. No myalgias. No recent chest pains.  Chronic eczema mostly involving hands. He's used steroid creams in the past per dermatology which helped. Frequently has his hands in water at work and uses gloves for protection  Past Medical History  Diagnosis Date  . Arthritis   . Diabetes mellitus type II   . Hay fever     with allergies  . Hyperlipidemia   . DIABETES MELLITUS, TYPE II 08/18/2009  . HYPERLIPIDEMIA 08/18/2009   Past Surgical History  Procedure Laterality Date  . Knee surgery  2005    r. knee    reports that he has never smoked. His smokeless tobacco use includes Chew. He reports that  drinks alcohol. He reports that he does not use illicit drugs. family history includes Alcohol abuse in his maternal grandfather; Diabetes in his maternal grandmother and mother; Heart disease in his father; Hyperlipidemia in his mother; Hypertension in his mother; Prostate cancer in his father; Stroke in his mother. No Known Allergies   Review of Systems  Constitutional: Negative for fatigue.  Eyes: Negative for visual disturbance.  Respiratory: Negative for cough, chest tightness and shortness of breath.   Cardiovascular: Negative for chest pain, palpitations and leg swelling.  Endocrine: Negative for polydipsia and polyuria.  Skin: Positive for rash.  Neurological: Negative for dizziness, syncope, weakness, light-headedness and headaches.       Objective:   Physical Exam  Constitutional: He appears well-developed and well-nourished. No distress.  HENT:  Right Ear:  External ear normal.  Left Ear: External ear normal.  Mouth/Throat: Oropharynx is clear and moist.  Neck: Neck supple. No thyromegaly present.  Cardiovascular: Normal rate and regular rhythm.   Pulmonary/Chest: Effort normal and breath sounds normal. No respiratory distress. He has no wheezes. He has no rales.  Skin: Rash noted.  Feet reveal no skin lesions. Good distal foot pulses. Good capillary refill. No calluses. Normal sensation with monofilament testing  Patient has scattered areas of rash on both hands. Slightly erythematous and scaly. No pustules           Assessment & Plan:  #1 type 2 diabetes. History of fair control. We've encouraged further weight loss. Eye exams up-to-date. Recheck urine microalbumin. Recent A1c 7.1% #2 hyperlipidemia. Repeat lipid and hepatic panel. Continue simvastatin #3 chronic eczema involving both hands. Refill Topicort 0.25% cream and encouraged not to use more than 2 weeks continuously #4 obesity. We discussed need for weight loss and discussed strategies.

## 2012-12-16 ENCOUNTER — Encounter: Payer: Self-pay | Admitting: Family Medicine

## 2012-12-16 DIAGNOSIS — E669 Obesity, unspecified: Secondary | ICD-10-CM | POA: Insufficient documentation

## 2012-12-16 LAB — MICROALBUMIN / CREATININE URINE RATIO: Microalb, Ur: 0.5 mg/dL (ref 0.00–1.89)

## 2013-02-08 ENCOUNTER — Other Ambulatory Visit: Payer: Self-pay

## 2013-06-15 ENCOUNTER — Encounter: Payer: Self-pay | Admitting: Family Medicine

## 2013-06-15 ENCOUNTER — Ambulatory Visit (INDEPENDENT_AMBULATORY_CARE_PROVIDER_SITE_OTHER): Payer: BC Managed Care – PPO | Admitting: Family Medicine

## 2013-06-15 VITALS — BP 120/90 | HR 92 | Wt 242.0 lb

## 2013-06-15 DIAGNOSIS — E119 Type 2 diabetes mellitus without complications: Secondary | ICD-10-CM

## 2013-06-15 DIAGNOSIS — L219 Seborrheic dermatitis, unspecified: Secondary | ICD-10-CM

## 2013-06-15 LAB — HEMOGLOBIN A1C
Hgb A1c MFr Bld: 7.7 % — ABNORMAL HIGH (ref <5.7)
Mean Plasma Glucose: 174 mg/dL — ABNORMAL HIGH (ref <117)

## 2013-06-15 MED ORDER — TRIAMCINOLONE ACETONIDE 0.1 % EX CREA: 1.0000 "application " | TOPICAL_CREAM | Freq: Two times a day (BID) | CUTANEOUS | Status: DC

## 2013-06-15 NOTE — Patient Instructions (Signed)
Seborrheic Dermatitis Seborrheic dermatitis involves pink or red skin with greasy, flaky scales. This is often found on the scalp, eyebrows, nose, bearded area, and on or behind the ears. It can also occur on the central chest. It often occurs where there are more oil (sebaceous) glands. This condition is also known as dandruff. When this condition affects a baby's scalp, it is called cradle cap. It may come and go for no known reason. It can occur at any time of life from infancy to old age. CAUSES  The cause is unknown. It is not the result of too little moisture or too much oil. In some people, seborrheic dermatitis flare-ups seem to be triggered by stress. It also commonly occurs in people with certain diseases such as Parkinson's disease or HIV/AIDS. SYMPTOMS   Thick scales on the scalp.  Redness on the face or in the armpits.  The skin may seem oily or dry, but moisturizers do not help.  In infants, seborrheic dermatitis appears as scaly redness that does not seem to bother the baby. In some babies, it affects only the scalp. In others, it also affects the neck creases, armpits, groin, or behind the ears.  In adults and adolescents, seborrheic dermatitis may affect only the scalp. It may look patchy or spread out, with areas of redness and flaking. Other areas commonly affected include:  Eyebrows.  Eyelids.  Forehead.  Skin behind the ears.  Outer ears.  Chest.  Armpits.  Nose creases.  Skin creases under the breasts.  Skin between the buttocks.  Groin.  Some adults and adolescents feel itching or burning in the affected areas. DIAGNOSIS  Your caregiver can usually tell what the problem is by doing a physical exam. TREATMENT   Cortisone (steroid) ointments, creams, and lotions can help decrease inflammation.  Babies can be treated with baby oil to soften the scales, then they may be washed with baby shampoo. If this does not help, a prescription topical steroid  medicine may work.  Adults can use medicated shampoos.  Your caregiver may prescribe corticosteroid cream and shampoo containing an antifungal or yeast medicine (ketoconazole). Hydrocortisone or anti-yeast cream can be rubbed directly onto seborrheic dermatitis patches. Yeast does not cause seborrheic dermatitis, but it seems to add to the problem. In infants, seborrheic dermatitis is often worst during the first year of life. It tends to disappear on its own as the child grows. However, it may return during the teenage years. In adults and adolescents, seborrheic dermatitis tends to be a long-lasting condition that comes and goes over many years. HOME CARE INSTRUCTIONS   Use prescribed medicines as directed.  In infants, do not aggressively remove the scales or flakes on the scalp with a comb or by other means. This may lead to hair loss. SEEK MEDICAL CARE IF:   The problem does not improve from the medicated shampoos, lotions, or other medicines given by your caregiver.  You have any other questions or concerns. Document Released: 03/22/2005 Document Revised: 09/21/2011 Document Reviewed: 08/11/2009 [facility] Patient Information 2014 [place].

## 2013-06-15 NOTE — Progress Notes (Signed)
   Subjective:    Patient ID: Richard Carroll, male    DOB: May 18, 1970, 43 y.o.   MRN: [id number]  HPI  Patient seen for followup regarding type 2 diabetes, dyslipidemia, obesity. Unfortunately, he has gained some weight over the winter time. No exercise. Poor dietary compliance. Not monitoring blood sugars regularly. Last A1c 7.1%. No symptoms of hyperglycemia. No recent hypoglycemia. He is compliant with medications.  Patient complains of pruritic rash mostly involving his eyebrow region and nasolabial folds. He's tried various lotions without improvement. Moderate associated pruritus.  Past Medical History  Diagnosis Date  . Arthritis   . Diabetes mellitus type II   . Hay fever     with allergies  . Hyperlipidemia   . DIABETES MELLITUS, TYPE II 08/18/2009  . HYPERLIPIDEMIA 08/18/2009   Past Surgical History  Procedure Laterality Date  . Knee surgery  2005    r. knee    reports that he has never smoked. His smokeless tobacco use includes Chew. He reports that he drinks alcohol. He reports that he does not use illicit drugs. family history includes Alcohol abuse in his maternal grandfather; Diabetes in his maternal grandmother and mother; Heart disease in his father; Hyperlipidemia in his mother; Hypertension in his mother; Prostate cancer in his father; Stroke in his mother. No Known Allergies    Review of Systems  Constitutional: Negative for fever, chills and fatigue.  Eyes: Negative for visual disturbance.  Respiratory: Negative for cough, chest tightness and shortness of breath.   Cardiovascular: Negative for chest pain, palpitations and leg swelling.  Endocrine: Negative for polydipsia and polyuria.  Skin: Positive for rash.  Neurological: Negative for dizziness, syncope, weakness, light-headedness and headaches.       Objective:   Physical Exam  Constitutional: He appears well-developed and well-nourished. No distress.  Neck: Neck supple. No thyromegaly present.    Cardiovascular: Normal rate and regular rhythm.   Pulmonary/Chest: Effort normal and breath sounds normal. No respiratory distress. He has no wheezes. He has no rales.  Lymphadenopathy:    He has no cervical adenopathy.  Skin: Rash noted.  Patient scattered rash especially right eyebrow region also nasolabial fold. Erythematous base with flaky scaly surface          Assessment & Plan:  #1 type 2 diabetes. History of fair control. Recheck A1c. We discussed that he needs to lose some weight. Suspect his A1c may be more elevated with recent weight gain #2 seborrheic dermatitis. Triamcinolone 0.1% cream twice a day

## 2013-06-16 ENCOUNTER — Encounter: Payer: Self-pay | Admitting: Family Medicine

## 2013-06-16 DIAGNOSIS — L219 Seborrheic dermatitis, unspecified: Secondary | ICD-10-CM | POA: Insufficient documentation

## 2013-06-19 ENCOUNTER — Other Ambulatory Visit: Payer: Self-pay | Admitting: Family Medicine

## 2013-11-26 ENCOUNTER — Ambulatory Visit (INDEPENDENT_AMBULATORY_CARE_PROVIDER_SITE_OTHER): Payer: BC Managed Care – PPO | Admitting: Family Medicine

## 2013-11-26 ENCOUNTER — Encounter: Payer: Self-pay | Admitting: Family Medicine

## 2013-11-26 ENCOUNTER — Telehealth: Payer: Self-pay | Admitting: Family Medicine

## 2013-11-26 VITALS — BP 126/80 | HR 70 | Temp 98.1°F | Wt 237.0 lb

## 2013-11-26 DIAGNOSIS — L309 Dermatitis, unspecified: Secondary | ICD-10-CM

## 2013-11-26 DIAGNOSIS — L259 Unspecified contact dermatitis, unspecified cause: Secondary | ICD-10-CM

## 2013-11-26 DIAGNOSIS — E785 Hyperlipidemia, unspecified: Secondary | ICD-10-CM

## 2013-11-26 DIAGNOSIS — E119 Type 2 diabetes mellitus without complications: Secondary | ICD-10-CM

## 2013-11-26 LAB — HEPATIC FUNCTION PANEL
ALT: 25 U/L (ref 0–53)
AST: 17 U/L (ref 0–37)
Albumin: 4 g/dL (ref 3.5–5.2)
Alkaline Phosphatase: 42 U/L (ref 39–117)
BILIRUBIN TOTAL: 0.6 mg/dL (ref 0.2–1.2)
Bilirubin, Direct: 0 mg/dL (ref 0.0–0.3)
Total Protein: 7.4 g/dL (ref 6.0–8.3)

## 2013-11-26 LAB — HM DIABETES FOOT EXAM: HM DIABETIC FOOT EXAM: NORMAL

## 2013-11-26 LAB — LIPID PANEL
CHOLESTEROL: 166 mg/dL (ref 0–200)
HDL: 35.9 mg/dL — AB (ref >39.00)
LDL Cholesterol: 97 mg/dL (ref 0–99)
NonHDL: 130.1
Total CHOL/HDL Ratio: 5
Triglycerides: 168 mg/dL — ABNORMAL HIGH (ref 0.0–149.0)
VLDL: 33.6 mg/dL (ref 0.0–40.0)

## 2013-11-26 LAB — MICROALBUMIN / CREATININE URINE RATIO
Creatinine,U: 159 mg/dL
MICROALB/CREAT RATIO: 0.5 mg/g (ref 0.0–30.0)
Microalb, Ur: 0.8 mg/dL (ref 0.0–1.9)

## 2013-11-26 LAB — HEMOGLOBIN A1C: Hgb A1c MFr Bld: 8.3 % — ABNORMAL HIGH (ref 4.6–6.5)

## 2013-11-26 MED ORDER — BETAMETHASONE DIPROPIONATE AUG 0.05 % EX OINT: TOPICAL_OINTMENT | CUTANEOUS | Status: DC

## 2013-11-26 MED ORDER — GLIMEPIRIDE 4 MG PO TABS: 4.0000 mg | ORAL_TABLET | Freq: Every day | ORAL | Status: DC

## 2013-11-26 NOTE — Progress Notes (Signed)
Pre visit review using our clinic review tool, if applicable. No additional management support is needed unless otherwise documented below in the visit note. 

## 2013-11-26 NOTE — Progress Notes (Signed)
   Subjective:    Patient ID: Richard Carroll, male    DOB: 02/14/1971, 43 y.o.   MRN: [id number]  HPI Medical followup  Type 2 diabetes. Last A1c 7.7%. Does not monitor sugars regularly. He remains on metformin and Amaryl. No recent hypoglycemia. Inconsistent exercise.  Hyperlipidemia treated with simvastatin 40 mg daily. Compliant with therapy. No history of any recent chest pains or myalgias.  History of dyshidrotic eczema involving the hands. He's used topical steroids in the past. Requesting refills. Moderate pruritis.  Past Medical History  Diagnosis Date  . Arthritis   . Diabetes mellitus type II   . Hay fever     with allergies  . Hyperlipidemia   . DIABETES MELLITUS, TYPE II 08/18/2009  . HYPERLIPIDEMIA 08/18/2009   Past Surgical History  Procedure Laterality Date  . Knee surgery  2005    r. knee    reports that he has never smoked. His smokeless tobacco use includes Chew. He reports that he drinks alcohol. He reports that he does not use illicit drugs. family history includes Alcohol abuse in his maternal grandfather; Diabetes in his maternal grandmother and mother; Heart disease in his father; Hyperlipidemia in his mother; Hypertension in his mother; Prostate cancer in his father; Stroke in his mother. No Known Allergies    Review of Systems  Constitutional: Negative for fatigue.  Eyes: Negative for visual disturbance.  Respiratory: Negative for cough, chest tightness and shortness of breath.   Cardiovascular: Negative for chest pain, palpitations and leg swelling.  Endocrine: Negative for polydipsia and polyuria.  Genitourinary: Negative for frequency.  Neurological: Negative for dizziness, syncope, weakness, light-headedness and headaches.       Objective:   Physical Exam  Constitutional: He appears well-developed and well-nourished. No distress.  Neck: Neck supple. No thyromegaly present.  Cardiovascular: Normal rate and regular rhythm.  Exam reveals no  gallop.   No murmur heard. Pulmonary/Chest: Effort normal and breath sounds normal. No respiratory distress. He has no wheezes. He has no rales.  Musculoskeletal: He exhibits no edema.  Skin: Rash noted.  Erythematous scaly rash involving several digits of both hands.  No pustules          Assessment & Plan:  #1 type 2 diabetes. History of slightly suboptimal control. Recheck A1c. If still suboptimal add additional medication-GLP-1 receptor agonist vs SGT 2 inhibitor. Needs to set up eye exam. #2 hyperlipidemia. Check lipid and hepatic panel. #3 probable dyshidrotic eczema involving the hands. Diprolene 0.05% ointment to use twice daily as needed

## 2013-11-26 NOTE — Telephone Encounter (Signed)
Relevant patient education mailed to patient.  

## 2013-11-28 ENCOUNTER — Other Ambulatory Visit: Payer: Self-pay

## 2013-11-28 MED ORDER — CANAGLIFLOZIN 100 MG PO TABS: 100.0000 mg | ORAL_TABLET | Freq: Every day | ORAL | Status: DC

## 2013-11-30 ENCOUNTER — Telehealth: Payer: Self-pay | Admitting: Family Medicine

## 2013-11-30 NOTE — Telephone Encounter (Signed)
Pt said this med Canagliflozin (INVOKANA) 100 MG TABS  Was to expensive and he did not get it.    Pt would like to know if there is something else you can rx for him  Pharmacy ; [place]

## 2013-11-30 NOTE — Telephone Encounter (Signed)
Unless he is able to lose some weight and improve diabetes without additional meds, ANY additional diabetes meds at this time are likely to be expensive.  Next step would be to consider Levemir 10 units once daily (in addition to his oral diabetes meds).  Would consider levemir pen and he could get this and bring here for further instruction in use.

## 2013-11-30 NOTE — Telephone Encounter (Signed)
Pt wants to try and lose some weight.

## 2013-12-17 ENCOUNTER — Ambulatory Visit: Payer: BC Managed Care – PPO | Admitting: Family Medicine

## 2014-02-18 ENCOUNTER — Ambulatory Visit (INDEPENDENT_AMBULATORY_CARE_PROVIDER_SITE_OTHER): Payer: BC Managed Care – PPO | Admitting: Family Medicine

## 2014-02-18 ENCOUNTER — Encounter: Payer: Self-pay | Admitting: Family Medicine

## 2014-02-18 VITALS — BP 124/80 | HR 85 | Temp 98.4°F | Wt 231.0 lb

## 2014-02-18 DIAGNOSIS — IMO0002 Reserved for concepts with insufficient information to code with codable children: Secondary | ICD-10-CM

## 2014-02-18 DIAGNOSIS — E1165 Type 2 diabetes mellitus with hyperglycemia: Secondary | ICD-10-CM

## 2014-02-18 NOTE — Progress Notes (Signed)
Pre visit review using our clinic review tool, if applicable. No additional management support is needed unless otherwise documented below in the visit note. 

## 2014-02-18 NOTE — Progress Notes (Signed)
   Subjective:    Patient ID: Richard Carroll, male    DOB: [birth year], 43 y.o.   MRN: [id number]  HPI Follow-up type 2 diabetes. We had added Invokana to his metformin and glimepiride because of hemoglobin A1c 8.3%. He never got this filled because of cost issues. However, he has lost 6 pounds made some positive last all changes. His been more physically active. Not monitoring blood sugars. No polydipsia or polyuria. He is compliant with other medications. We explained we could give him a coupon for the new medication.  Past Medical History  Diagnosis Date  . Arthritis   . Diabetes mellitus type II   . Hay fever     with allergies  . Hyperlipidemia   . DIABETES MELLITUS, TYPE II 08/18/2009  . HYPERLIPIDEMIA 08/18/2009   Past Surgical History  Procedure Laterality Date  . Knee surgery  2005    r. knee    reports that he has never smoked. His smokeless tobacco use includes Chew. He reports that he drinks alcohol. He reports that he does not use illicit drugs. family history includes Alcohol abuse in his maternal grandfather; Diabetes in his maternal grandmother and mother; Heart disease in his father; Hyperlipidemia in his mother; Hypertension in his mother; Prostate cancer in his father; Stroke in his mother. No Known Allergies    Review of Systems  Constitutional: Negative for fatigue.  Eyes: Negative for visual disturbance.  Respiratory: Negative for cough, chest tightness and shortness of breath.   Cardiovascular: Negative for chest pain, palpitations and leg swelling.  Endocrine: Negative for polydipsia and polyuria.  Neurological: Negative for dizziness, syncope, weakness, light-headedness and headaches.  All other systems reviewed and are negative.      Objective:   Physical Exam  Constitutional: He appears well-developed and well-nourished.  Cardiovascular: Normal rate and regular rhythm.  Exam reveals no gallop.   No murmur heard. Pulmonary/Chest: Effort normal and  breath sounds normal. No respiratory distress. He has no wheezes. He has no rales.  Musculoskeletal: He exhibits no edema.          Assessment & Plan:  Type 2 diabetes. Recent poor control. Continue weight loss efforts. We gave him a coupon to get Invokana . We elected not to do labs today since he never started that medication but yet on this daily and reassess in 3 months.

## 2014-02-27 ENCOUNTER — Other Ambulatory Visit: Payer: Self-pay | Admitting: Family Medicine

## 2014-05-21 ENCOUNTER — Ambulatory Visit (INDEPENDENT_AMBULATORY_CARE_PROVIDER_SITE_OTHER): Payer: BLUE CROSS/BLUE SHIELD | Admitting: Family Medicine

## 2014-05-21 ENCOUNTER — Encounter: Payer: Self-pay | Admitting: Family Medicine

## 2014-05-21 VITALS — BP 126/80 | HR 90 | Temp 98.0°F | Wt 218.0 lb

## 2014-05-21 DIAGNOSIS — E1165 Type 2 diabetes mellitus with hyperglycemia: Secondary | ICD-10-CM

## 2014-05-21 DIAGNOSIS — IMO0002 Reserved for concepts with insufficient information to code with codable children: Secondary | ICD-10-CM

## 2014-05-21 DIAGNOSIS — E785 Hyperlipidemia, unspecified: Secondary | ICD-10-CM

## 2014-05-21 DIAGNOSIS — J209 Acute bronchitis, unspecified: Secondary | ICD-10-CM

## 2014-05-21 LAB — HEMOGLOBIN A1C: Hgb A1c MFr Bld: 6.7 % — ABNORMAL HIGH (ref 4.6–6.5)

## 2014-05-21 MED ORDER — HYDROCODONE-HOMATROPINE 5-1.5 MG/5ML PO SYRP: 5.0000 mL | ORAL_SOLUTION | Freq: Four times a day (QID) | ORAL | Status: AC | PRN

## 2014-05-21 NOTE — Patient Instructions (Signed)

## 2014-05-21 NOTE — Progress Notes (Signed)
Pre visit review using our clinic review tool, if applicable. No additional management support is needed unless otherwise documented below in the visit note. 

## 2014-05-21 NOTE — Progress Notes (Signed)
   Subjective:    Patient ID: Richard Carroll, male    DOB: 21-Aug-1970, 44 y.o.   MRN: [id number]  HPI Patient seen for acute issue of upper respiratory illness and for medical follow-up  Onset last week of cough, nasal congestion, body aches, sinus pressure. No fever. Cough has been severe especially at night. He is taken over-the-counter Delsym cough syrup without relief. Nonsmoker. 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 This is exacerbating his cough. Increased malaise.  Type 2 diabetes. History of poor control. Last A1c 8.3%. We added Invokana to his metformin and he is tolerating without any side effects. He's lost some weight recently which he attributes to working 2 jobs. Blood sugars are improved. No hypoglycemia. No symptoms of polyuria or polydipsia.  Hyperlipidemia treated with simvastatin. No myalgias other than with recent infection.  Past Medical History  Diagnosis Date  . Arthritis   . Diabetes mellitus type II   . Hay fever     with allergies  . Hyperlipidemia   . DIABETES MELLITUS, TYPE II 08/18/2009  . HYPERLIPIDEMIA 08/18/2009   Past Surgical History  Procedure Laterality Date  . Knee surgery  2005    r. knee    reports that he has never smoked. His smokeless tobacco use includes Chew. He reports that he drinks alcohol. He reports that he does not use illicit drugs. family history includes Alcohol abuse in his maternal grandfather; Diabetes in his maternal grandmother and mother; Heart disease in his father; Hyperlipidemia in his mother; Hypertension in his mother; Prostate cancer in his father; Stroke in his mother. No Known Allergies    Review of Systems  Constitutional: Positive for fatigue. Negative for fever and chills.  HENT: Positive for congestion.   Eyes: Negative for visual disturbance.  Respiratory: Positive for cough. Negative for chest tightness and shortness of breath.   Cardiovascular: Negative for  chest pain, palpitations and leg swelling.  Endocrine: Negative for polydipsia and polyuria.  Neurological: Negative for dizziness, syncope, weakness, light-headedness and headaches.       Objective:   Physical Exam  Constitutional: He appears well-developed and well-nourished.  HENT:  Right Ear: External ear normal.  Left Ear: External ear normal.  Mouth/Throat: Oropharynx is clear and moist.  Neck: Neck supple.  Cardiovascular: Normal rate and regular rhythm.   Pulmonary/Chest: Effort normal and breath sounds normal. No respiratory distress. He has no wheezes. He has no rales.  Lymphadenopathy:    He has no cervical adenopathy.          Assessment & Plan:  #1 acute bronchitis. Suspect viral. Hycodan cough syrup 1 teaspoon daily at bedtime for severe cough. Follow-up for fever or worsening symptoms #2 type 2 diabetes. History of recent poor control. Recent addition of Invokana as above. Hopefully, this is improved with weight loss. Recheck A1c. Consider further titration of Invokana if indicated #3 hyperlipidemia. Continue simvastatin. Check lipids at follow-up in 4 months

## 2014-07-09 ENCOUNTER — Encounter: Payer: Self-pay | Admitting: Internal Medicine

## 2014-07-09 ENCOUNTER — Ambulatory Visit (INDEPENDENT_AMBULATORY_CARE_PROVIDER_SITE_OTHER): Payer: BLUE CROSS/BLUE SHIELD | Admitting: Internal Medicine

## 2014-07-09 VITALS — BP 132/86 | HR 97 | Temp 98.5°F | Resp 18 | Wt 213.0 lb

## 2014-07-09 DIAGNOSIS — J019 Acute sinusitis, unspecified: Secondary | ICD-10-CM | POA: Diagnosis not present

## 2014-07-09 DIAGNOSIS — IMO0002 Reserved for concepts with insufficient information to code with codable children: Secondary | ICD-10-CM

## 2014-07-09 DIAGNOSIS — J309 Allergic rhinitis, unspecified: Secondary | ICD-10-CM | POA: Diagnosis not present

## 2014-07-09 DIAGNOSIS — E1165 Type 2 diabetes mellitus with hyperglycemia: Secondary | ICD-10-CM | POA: Diagnosis not present

## 2014-07-09 MED ORDER — LEVOFLOXACIN 500 MG PO TABS: 500.0000 mg | ORAL_TABLET | Freq: Every day | ORAL | Status: DC

## 2014-07-09 MED ORDER — TRIAMCINOLONE ACETONIDE 55 MCG/ACT NA AERO: 2.0000 | INHALATION_SPRAY | Freq: Every day | NASAL | Status: DC

## 2014-07-09 NOTE — Assessment & Plan Note (Signed)
stable overall by history and exam, recent data reviewed with pt, and pt to continue medical treatment as before,  to f/u any worsening symptoms or concerns Lab Results  Component Value Date   HGBA1C 6.7* 05/21/2014   To call for onset polys or sugar > 200 with infection

## 2014-07-09 NOTE — Assessment & Plan Note (Signed)
Mild to mod, for antibx course,  to f/u any worsening symptoms or concerns 

## 2014-07-09 NOTE — Progress Notes (Signed)
Subjective:    Patient ID: Richard Carroll, male    DOB: 02-06-1971, 44 y.o.   MRN: [id number]  HPI   Here with 2-3 days acute onset fever, facial pain, pressure, headache, general weakness and malaise, and greenish d/c, with mild ST and cough, but pt denies chest pain, wheezing, increased sob or doe, orthopnea, PND, increased LE swelling, palpitations, dizziness or syncope.  Does have several wks ongoing nasal allergy symptoms with clearish congestion, itch and sneezing, without fever, pain, ST, cough, swelling . Pt denies new neurological symptoms such as new headache, or facial or extremity weakness or numbness   Past Medical History  Diagnosis Date  . Arthritis   . Diabetes mellitus type II   . Hay fever     with allergies  . Hyperlipidemia   . DIABETES MELLITUS, TYPE II 08/18/2009  . HYPERLIPIDEMIA 08/18/2009   Past Surgical History  Procedure Laterality Date  . Knee surgery  2005    r. knee    reports that he has never smoked. His smokeless tobacco use includes Chew. He reports that he drinks alcohol. He reports that he does not use illicit drugs. family history includes Alcohol abuse in his maternal grandfather; Diabetes in his maternal grandmother and mother; Heart disease in his father; Hyperlipidemia in his mother; Hypertension in his mother; Prostate cancer in his father; Stroke in his mother. No Known Allergies Current Outpatient Prescriptions on File Prior to Visit  Medication Sig Dispense Refill  . augmented betamethasone dipropionate (DIPROLENE) 0.05 % ointment Apply twice daily as needed no longer than 2 weeks of continuous use 30 g 3  . azelastine (ASTELIN) 137 MCG/SPRAY nasal spray Place 2 sprays into the nose 2 (two) times daily as needed. 30 mL 11  . Canagliflozin (INVOKANA) 100 MG TABS Take 1 tablet (100 mg total) by mouth daily. 30 tablet 5  . glimepiride (AMARYL) 4 MG tablet Take 1 tablet (4 mg total) by mouth daily before breakfast. 90 tablet 3  . glucose blood  (ACCU-CHEK AVIVA) test strip 1 each by Other route as directed. Use as instructed     . metFORMIN (GLUCOPHAGE) 500 MG tablet TAKE 2 TABLETS BY MOUTH TWICE A DAY WITH MEALS 120 tablet 5  . simvastatin (ZOCOR) 80 MG tablet TAKE 1/2 TABLET BY MOUTH DAILY    . triamcinolone cream (KENALOG) 0.1 % Apply 1 application topically 2 (two) times daily. 30 g 3   No current facility-administered medications on file prior to visit.   Review of Systems  Constitutional: Negative for unusual diaphoresis or night sweats HENT: Negative for ringing in ear or discharge Eyes: Negative for double vision or worsening visual disturbance.  Respiratory: Negative for choking and stridor.   Gastrointestinal: Negative for vomiting or other signifcant bowel change Genitourinary: Negative for hematuria or change in urine volume.  Musculoskeletal: Negative for other MSK pain or swelling Skin: Negative for color change and worsening wound.  Neurological: Negative for tremors and numbness other than noted  Psychiatric/Behavioral: Negative for decreased concentration or agitation other than above       Objective:   Physical Exam BP 132/86 mmHg  Pulse 97  Temp(Src) 98.5 F (36.9 C) (Oral)  Resp 18  Wt 213 lb (96.616 kg)  SpO2 97% VS noted, mild ill Constitutional: Pt appears in no significant distress HENT: Head: NCAT.  Right Ear: External ear normal.  Left Ear: External ear normal.  Eyes: . Pupils are equal, round, and reactive to light. Conjunctivae and EOM  are normal Bilat tm's with mild erythema.  Max sinus areas mild tender.  Pharynx with mild erythema, no exudate Neck: Normal range of motion. Neck supple.  Cardiovascular: Normal rate and regular rhythm.   Pulmonary/Chest: Effort normal and breath sounds without rales or wheezing.  Abd:  Soft, NT, ND, + BS Neurological: Pt is alert. Not confused , motor grossly intact Skin: Skin is warm. No rash, no LE edema Psychiatric: Pt behavior is normal. No  agitation.      Assessment & Plan:

## 2014-07-09 NOTE — Assessment & Plan Note (Signed)
Mild to mod, for nasacort asd,  to f/u any worsening symptoms or concerns 

## 2014-07-09 NOTE — Progress Notes (Signed)
Pre visit review using our clinic review tool, if applicable. No additional management support is needed unless otherwise documented below in the visit note. 

## 2014-07-09 NOTE — Patient Instructions (Signed)
Please take all new medication as prescribed - the antibiotic, and nasacort  You can also take Delsym OTC for cough, and/or Mucinex (or it's generic off brand) for congestion, and tylenol as needed for pain.  Please continue all other medications as before, and refills have been done if requested.  Please have the pharmacy call with any other refills you may need.  Please continue your efforts at being more active, low cholesterol diabetic diet, and weight control.  Please keep your appointments with your specialists as you may have planned

## 2014-07-12 ENCOUNTER — Telehealth: Payer: Self-pay

## 2014-07-12 MED ORDER — SIMVASTATIN 80 MG PO TABS
40.0000 mg | ORAL_TABLET | Freq: Every day | ORAL | Status: DC
Start: 2014-07-12 — End: 2014-07-12

## 2014-07-12 MED ORDER — SIMVASTATIN 80 MG PO TABS: 40.0000 mg | ORAL_TABLET | Freq: Every day | ORAL | Status: DC

## 2014-07-12 NOTE — Telephone Encounter (Signed)
[facility] refill request for SIMVASTATIN 80MG 

## 2014-07-12 NOTE — Telephone Encounter (Signed)
Rx sent to pharmacy   

## 2014-08-16 LAB — HM DIABETES EYE EXAM

## 2014-08-20 ENCOUNTER — Encounter: Payer: Self-pay | Admitting: Family Medicine

## 2014-09-07 ENCOUNTER — Other Ambulatory Visit: Payer: Self-pay | Admitting: Family Medicine

## 2014-09-08 ENCOUNTER — Other Ambulatory Visit: Payer: Self-pay | Admitting: Family Medicine

## 2014-09-19 ENCOUNTER — Ambulatory Visit (INDEPENDENT_AMBULATORY_CARE_PROVIDER_SITE_OTHER): Payer: BLUE CROSS/BLUE SHIELD | Admitting: Family Medicine

## 2014-09-19 ENCOUNTER — Encounter: Payer: Self-pay | Admitting: Family Medicine

## 2014-09-19 VITALS — BP 128/82 | HR 91 | Temp 97.9°F | Wt 202.0 lb

## 2014-09-19 DIAGNOSIS — E119 Type 2 diabetes mellitus without complications: Secondary | ICD-10-CM

## 2014-09-19 DIAGNOSIS — E785 Hyperlipidemia, unspecified: Secondary | ICD-10-CM | POA: Diagnosis not present

## 2014-09-19 LAB — HEPATIC FUNCTION PANEL
ALT: 26 U/L (ref 0–53)
AST: 17 U/L (ref 0–37)
Albumin: 4.2 g/dL (ref 3.5–5.2)
Alkaline Phosphatase: 56 U/L (ref 39–117)
Bilirubin, Direct: 0.1 mg/dL (ref 0.0–0.3)
Total Bilirubin: 0.7 mg/dL (ref 0.2–1.2)
Total Protein: 6.6 g/dL (ref 6.0–8.3)

## 2014-09-19 LAB — MICROALBUMIN / CREATININE URINE RATIO
Creatinine,U: 120.3 mg/dL
MICROALB/CREAT RATIO: 0.7 mg/g (ref 0.0–30.0)
Microalb, Ur: 0.9 mg/dL (ref 0.0–1.9)

## 2014-09-19 LAB — LIPID PANEL
Cholesterol: 161 mg/dL (ref 0–200)
HDL: 46.2 mg/dL (ref >39.00)
LDL CALC: 89 mg/dL (ref 0–99)
NONHDL: 114.8
Total CHOL/HDL Ratio: 3
Triglycerides: 128 mg/dL (ref 0.0–149.0)
VLDL: 25.6 mg/dL (ref 0.0–40.0)

## 2014-09-19 LAB — HEMOGLOBIN A1C: Hgb A1c MFr Bld: 7 % — ABNORMAL HIGH (ref 4.6–6.5)

## 2014-09-19 NOTE — Progress Notes (Signed)
Pre visit review using our clinic review tool, if applicable. No additional management support is needed unless otherwise documented below in the visit note. 

## 2014-09-19 NOTE — Progress Notes (Signed)
   Subjective:    Patient ID: Richard Carroll, male    DOB: [birth year], 44 y.o.   MRN: [id number]  HPI Here for medical follow-up. He has lost about 16 pounds since last visit which he attributes to working 2 jobs. His part-time job is especially physical. Overall, he has lost about 50 some pounds over the past year and a half due to being more active. Last A1c had gone down from 8.3-6.5%. He had occasional symptoms of weakness but never proven hypoglycemia. He remains on metformin, glimepiride, and invokana.  His waist circumference has gone from 42 inches to 34 inches. Overall he feels much better. He's never had any hypertension issues. Remains on simvastatin for hyperlipidemia.  Past Medical History  Diagnosis Date  . Arthritis   . Diabetes mellitus type II   . Hay fever     with allergies  . Hyperlipidemia   . DIABETES MELLITUS, TYPE II 08/18/2009  . HYPERLIPIDEMIA 08/18/2009   Past Surgical History  Procedure Laterality Date  . Knee surgery  2005    r. knee    reports that he has never smoked. His smokeless tobacco use includes Chew. He reports that he drinks alcohol. He reports that he does not use illicit drugs. family history includes Alcohol abuse in his maternal grandfather; Diabetes in his maternal grandmother and mother; Heart disease in his father; Hyperlipidemia in his mother; Hypertension in his mother; Prostate cancer in his father; Stroke in his mother. No Known Allergies    Review of Systems  Constitutional: Negative for fatigue and unexpected weight change.  Eyes: Negative for visual disturbance.  Respiratory: Negative for cough, chest tightness and shortness of breath.   Cardiovascular: Negative for chest pain, palpitations and leg swelling.  Endocrine: Negative for polydipsia and polyuria.  Neurological: Negative for dizziness, syncope, weakness, light-headedness and headaches.       Objective:   Physical Exam  Constitutional: He appears well-developed  and well-nourished. No distress.  Neck: Neck supple. No thyromegaly present.  Cardiovascular: Normal rate and regular rhythm.   Pulmonary/Chest: Effort normal and breath sounds normal. No respiratory distress. He has no wheezes. He has no rales.  Musculoskeletal: He exhibits no edema.  Lymphadenopathy:    He has no cervical adenopathy.          Assessment & Plan:  #1 type 2 diabetes. Tremendously improved with his weight loss efforts. Suspect this will be even further improved. Check A1c. We will likely discontinue his Amaryl if A1c further improved.  Also, screen urine microalbumin #2 hyperlipidemia. Check lipid and hepatic panel. Continue simvastatin

## 2014-12-16 ENCOUNTER — Telehealth: Payer: Self-pay | Admitting: Family Medicine

## 2014-12-16 MED ORDER — METFORMIN HCL 500 MG PO TABS: ORAL_TABLET | ORAL | Status: DC

## 2014-12-16 MED ORDER — GLIMEPIRIDE 4 MG PO TABS: 4.0000 mg | ORAL_TABLET | Freq: Every day | ORAL | Status: DC

## 2014-12-16 MED ORDER — SIMVASTATIN 80 MG PO TABS: 40.0000 mg | ORAL_TABLET | Freq: Every day | ORAL | Status: DC

## 2014-12-16 NOTE — Telephone Encounter (Signed)
Rx sent to mail order and pharmacy  

## 2014-12-16 NOTE — Telephone Encounter (Signed)
Pt needs new rxs  send [facility]. Metformin 500 mg #360, glimepride 4 mg #90 and simvastatin 80 mg #90 w/refills. Pt also needs glimepride 4 mg #10 only send to [address]

## 2015-01-09 ENCOUNTER — Telehealth: Payer: Self-pay | Admitting: Family Medicine

## 2015-01-09 MED ORDER — CANAGLIFLOZIN 100 MG PO TABS: ORAL_TABLET | ORAL | Status: DC

## 2015-01-09 NOTE — Telephone Encounter (Signed)
Pt request refill of the following: INVOKANA 100 MG TABS tablet   Phamacy: [facility] mail order

## 2015-01-09 NOTE — Telephone Encounter (Signed)
Rx was sent to the Pharmacy

## 2015-02-11 ENCOUNTER — Encounter: Payer: Self-pay | Admitting: Family Medicine

## 2015-02-11 ENCOUNTER — Ambulatory Visit (INDEPENDENT_AMBULATORY_CARE_PROVIDER_SITE_OTHER): Payer: BLUE CROSS/BLUE SHIELD | Admitting: Family Medicine

## 2015-02-11 VITALS — BP 136/80 | HR 93 | Temp 99.8°F | Resp 20 | Ht 71.0 in | Wt 207.5 lb

## 2015-02-11 DIAGNOSIS — J069 Acute upper respiratory infection, unspecified: Secondary | ICD-10-CM | POA: Diagnosis not present

## 2015-02-11 DIAGNOSIS — B9789 Other viral agents as the cause of diseases classified elsewhere: Principal | ICD-10-CM

## 2015-02-11 NOTE — Progress Notes (Signed)
Pre visit review using our clinic review tool, if applicable. No additional management support is needed unless otherwise documented below in the visit note. 

## 2015-02-11 NOTE — Patient Instructions (Addendum)
Viral Infections A viral infection can be caused by different types of viruses.Most viral infections are not serious and resolve on their own. However, some infections may cause severe symptoms and may lead to further complications. SYMPTOMS Viruses can frequently cause:  Minor sore throat.  Aches and pains.  Headaches.  Runny nose.  Different types of rashes.  Watery eyes.  Tiredness.  Cough.  Loss of appetite.  Gastrointestinal infections, resulting in nausea, vomiting, and diarrhea. These symptoms do not respond to antibiotics because the infection is not caused by bacteria. However, you might catch a bacterial infection following the viral infection. This is sometimes called a "superinfection." Symptoms of such a bacterial infection may include:  Worsening sore throat with pus and difficulty swallowing.  Swollen neck glands.  Chills and a high or persistent fever.  Severe headache.  Tenderness over the sinuses.  Persistent overall ill feeling (malaise), muscle aches, and tiredness (fatigue).  Persistent cough.  Yellow, green, or brown mucus production with coughing. HOME CARE INSTRUCTIONS   Only take over-the-counter or prescription medicines for pain, discomfort, diarrhea, or fever as directed by your caregiver.  Drink enough water and fluids to keep your urine clear or pale yellow. Sports drinks can provide valuable electrolytes, sugars, and hydration.  Get plenty of rest and maintain proper nutrition. Soups and broths with crackers or rice are fine. SEEK IMMEDIATE MEDICAL CARE IF:   You have severe headaches, shortness of breath, chest pain, neck pain, or an unusual rash.  You have uncontrolled vomiting, diarrhea, or you are unable to keep down fluids.  You or your child has an oral temperature above 102 F (38.9 C), not controlled by medicine.  Your baby is older than 3 months with a rectal temperature of 102 F (38.9 C) or higher.  Your baby is 66  months old or younger with a rectal temperature of 100.4 F (38 C) or higher. MAKE SURE YOU:   Understand these instructions.  Will watch your condition.  Will get help right away if you are not doing well or get worse.   This information is not intended to replace advice given to you by your health care provider. Make sure you discuss any questions you have with your health care provider.   Document Released: 12/30/2004 Document Revised: 06/14/2011 Document Reviewed: 08/28/2014 Elsevier Interactive Patient Education 2016 [place].  Stay well hydrated  Consider Advil or Aleve for body aches and fever. Continue with Mucinex for congestion.

## 2015-02-11 NOTE — Progress Notes (Signed)
   Subjective:    Patient ID: Richard Carroll, male    DOB: 08/26/1970, 44 y.o.   MRN: [id number]  HPI  Acute visit. Patient seen with onset last Friday sore throat. He subsequently developed body aches, nasal congestion with postnasal drip.  He's had some mild cough which is been nonproductive. Possible low-grade fever over the weekend with temperatures not taken. Denies any nausea, vomiting, or diarrhea. Type 2 diabetes and blood sugars been stable. Nonsmoker. Using over-the-counter Mucinex.  Past Medical History  Diagnosis Date  . Arthritis   . Diabetes mellitus type II   . Hay fever     with allergies  . Hyperlipidemia   . DIABETES MELLITUS, TYPE II 08/18/2009  . HYPERLIPIDEMIA 08/18/2009   Past Surgical History  Procedure Laterality Date  . Knee surgery  2005    r. knee    reports that he has never smoked. His smokeless tobacco use includes Chew. He reports that he drinks alcohol. He reports that he does not use illicit drugs. family history includes Alcohol abuse in his maternal grandfather; Diabetes in his maternal grandmother and mother; Heart disease in his father; Hyperlipidemia in his mother; Hypertension in his mother; Prostate cancer in his father; Stroke in his mother. No Known Allergies    Review of Systems  Constitutional: Negative for fever and chills.  HENT: Positive for congestion and sore throat.   Respiratory: Positive for cough.   Gastrointestinal: Negative for nausea and vomiting.  Musculoskeletal: Positive for myalgias.       Objective:   Physical Exam  Constitutional: He appears well-developed and well-nourished.  HENT:  Right Ear: External ear normal.  Left Ear: External ear normal.  Mild posterior pharynx erythema. No exudate  Neck: Neck supple.  Cardiovascular: Normal rate and regular rhythm.   Pulmonary/Chest: Effort normal and breath sounds normal. No respiratory distress. He has no wheezes. He has no rales.  Lymphadenopathy:    He has no  cervical adenopathy.          Assessment & Plan:  Viral URI with cough. Nonfocal exam. Treat symptomatically with Advil or Aleve. Stay well-hydrated. Work note written for today and tomorrow. Follow-up promptly for any worsening symptoms

## 2015-03-04 ENCOUNTER — Emergency Department (HOSPITAL_COMMUNITY)
Admission: EM | Admit: 2015-03-04 | Discharge: 2015-03-05 | Disposition: A | Discharge status: Home / Self Care | Payer: Worker's Compensation | Attending: Emergency Medicine | Admitting: Emergency Medicine

## 2015-03-04 ENCOUNTER — Encounter (HOSPITAL_COMMUNITY): Payer: Self-pay | Admitting: Emergency Medicine

## 2015-03-04 DIAGNOSIS — Z7951 Long term (current) use of inhaled steroids: Secondary | ICD-10-CM | POA: Diagnosis not present

## 2015-03-04 DIAGNOSIS — Y9289 Other specified places as the place of occurrence of the external cause: Secondary | ICD-10-CM | POA: Diagnosis not present

## 2015-03-04 DIAGNOSIS — E785 Hyperlipidemia, unspecified: Secondary | ICD-10-CM | POA: Diagnosis not present

## 2015-03-04 DIAGNOSIS — Z79899 Other long term (current) drug therapy: Secondary | ICD-10-CM | POA: Diagnosis not present

## 2015-03-04 DIAGNOSIS — S70311A Abrasion, right thigh, initial encounter: Secondary | ICD-10-CM | POA: Insufficient documentation

## 2015-03-04 DIAGNOSIS — S71111A Laceration without foreign body, right thigh, initial encounter: Secondary | ICD-10-CM | POA: Insufficient documentation

## 2015-03-04 DIAGNOSIS — W268XXA Contact with other sharp object(s), not elsewhere classified, initial encounter: Secondary | ICD-10-CM | POA: Diagnosis not present

## 2015-03-04 DIAGNOSIS — S7011XA Contusion of right thigh, initial encounter: Secondary | ICD-10-CM | POA: Diagnosis not present

## 2015-03-04 DIAGNOSIS — IMO0002 Reserved for concepts with insufficient information to code with codable children: Secondary | ICD-10-CM

## 2015-03-04 DIAGNOSIS — Z7952 Long term (current) use of systemic steroids: Secondary | ICD-10-CM | POA: Insufficient documentation

## 2015-03-04 DIAGNOSIS — Y9389 Activity, other specified: Secondary | ICD-10-CM | POA: Insufficient documentation

## 2015-03-04 DIAGNOSIS — Y990 Civilian activity done for income or pay: Secondary | ICD-10-CM | POA: Insufficient documentation

## 2015-03-04 DIAGNOSIS — E119 Type 2 diabetes mellitus without complications: Secondary | ICD-10-CM | POA: Insufficient documentation

## 2015-03-04 DIAGNOSIS — S79921A Unspecified injury of right thigh, initial encounter: Secondary | ICD-10-CM | POA: Diagnosis present

## 2015-03-04 DIAGNOSIS — Z8739 Personal history of other diseases of the musculoskeletal system and connective tissue: Secondary | ICD-10-CM | POA: Diagnosis not present

## 2015-03-04 MED ORDER — TETANUS-DIPHTH-ACELL PERTUSSIS 5-2.5-18.5 LF-MCG/0.5 IM SUSP
0.5000 mL | Freq: Once | INTRAMUSCULAR | Status: AC
  Administered 2015-03-04: 0.5 mL via INTRAMUSCULAR
  Filled 2015-03-04: qty 0.5

## 2015-03-04 MED ORDER — LIDOCAINE-EPINEPHRINE (PF) 2 %-1:200000 IJ SOLN
20.0000 mL | Freq: Once | INTRAMUSCULAR | Status: AC
  Administered 2015-03-04: 20 mL via INTRADERMAL
  Filled 2015-03-04: qty 20

## 2015-03-04 NOTE — ED Notes (Signed)
Pt. presents with laceration approx. 1/2 inch at right upper thigh sustained this evening from a piece of metal while carrying a pallet , minimal bleeding at arrival .

## 2015-03-04 NOTE — ED Provider Notes (Signed)
CSN: [id number]     Arrival date & time 03/04/15  2139 History  By signing my name below, I, Emmanuella Mensah, attest that this documentation has been prepared under the direction and in the presence of Delsa Grana, PA-C. Electronically Signed: Judithann Sauger, ED Scribe. 03/04/2015. 11:58 PM.    Chief Complaint  Patient presents with  . Laceration   The history is provided by the patient. No language interpreter was used.   HPI Comments: Richard Carroll is a 44 y.o. male with a hx of DM and HLD who presents to the Emergency Department complaining of a laceration on his right upper thigh s/p injury that occurred at work at 9 pm tonight. He reports associated "knot" and bruising surrounding the laceration. He denies any hip or knee pain. He explains that he was carrying a pallet with a co-worker when they accidentally dropped it on his leg and a piece of metal cut his thigh. No alleviating factors noted. Pt is unsure of his last Tetanus vaccine. He reports NKDA.    Past Medical History  Diagnosis Date  . Arthritis   . Diabetes mellitus type II   . Hay fever     with allergies  . Hyperlipidemia   . DIABETES MELLITUS, TYPE II 08/18/2009  . HYPERLIPIDEMIA 08/18/2009   Past Surgical History  Procedure Laterality Date  . Knee surgery  2005    r. knee   Family History  Problem Relation Age of Onset  . Hyperlipidemia Mother   . Stroke Mother   . Hypertension Mother   . Diabetes Mother   . Heart disease Father   . Prostate cancer Father   . Diabetes Maternal Grandmother   . Alcohol abuse Maternal Grandfather    Social History  Substance Use Topics  . Smoking status: Never Smoker   . Smokeless tobacco: Current User    Types: Chew  . Alcohol Use: Yes    Review of Systems  Constitutional: Negative for fever.  Musculoskeletal: Negative for arthralgias.  Skin: Positive for wound.       Surrounding knot and bruise      Allergies  Review of patient's allergies indicates no  known allergies.  Home Medications   Prior to Admission medications   Medication Sig Start Date End Date Taking? Authorizing Provider  augmented betamethasone dipropionate (DIPROLENE) 0.05 % ointment Apply twice daily as needed no longer than 2 weeks of continuous use 11/26/13   Eulas Post, MD  azelastine (ASTELIN) 137 MCG/SPRAY nasal spray Place 2 sprays into the nose 2 (two) times daily as needed. 07/31/12   Eulas Post, MD  canagliflozin (INVOKANA) 100 MG TABS tablet TAKE 1 TABLET (100 MG TOTAL) BY MOUTH DAILY. Patient not taking: Reported on 02/11/2015 01/09/15   Eulas Post, MD  glimepiride (AMARYL) 4 MG tablet Take 1 tablet (4 mg total) by mouth daily before breakfast. 12/16/14   Eulas Post, MD  glucose blood (ACCU-CHEK AVIVA) test strip 1 each by Other route as directed. Use as instructed     Historical Provider, MD  metFORMIN (GLUCOPHAGE) 500 MG tablet TAKE 2 TABLETS BY MOUTH TWICE A DAY WITH MEALS 12/16/14   Eulas Post, MD  simvastatin (ZOCOR) 80 MG tablet Take 0.5 tablets (40 mg total) by mouth daily. 12/16/14   Eulas Post, MD  triamcinolone (NASACORT AQ) 55 MCG/ACT AERO nasal inhaler Place 2 sprays into the nose daily. 07/09/14   Biagio Borg, MD  triamcinolone cream (KENALOG) 0.1 %  Apply 1 application topically 2 (two) times daily. 06/15/13   Eulas Post, MD   BP 138/86 mmHg  Pulse 78  Temp(Src) 98.8 F (37.1 C) (Oral)  Resp 18  Ht 5\' 10"  (1.778 m)  Wt 98.884 kg  BMI 31.28 kg/m2  SpO2 100% Physical Exam  Constitutional: He is oriented to person, place, and time. He appears well-developed and well-nourished. No distress.  HENT:  Head: Normocephalic and atraumatic.  Right Ear: External ear normal.  Left Ear: External ear normal.  Nose: Nose normal.  Mouth/Throat: Oropharynx is clear and moist. No oropharyngeal exudate.  Eyes: Conjunctivae and EOM are normal. Pupils are equal, round, and reactive to light. Right eye exhibits no discharge.  Left eye exhibits no discharge. No scleral icterus.  Neck: Normal range of motion. Neck supple. No JVD present. No tracheal deviation present.  Cardiovascular: Normal rate and regular rhythm.   Pulmonary/Chest: Effort normal and breath sounds normal. No stridor. No respiratory distress.  Musculoskeletal: Normal range of motion. He exhibits edema and tenderness.  1.5 cm jagged laceration and abrasion to right thigh with surrounding edema and bruising, ttp, dried blood, no active bleeding Right knee normal, right hip normal  Lymphadenopathy:    He has no cervical adenopathy.  Neurological: He is alert and oriented to person, place, and time. He exhibits normal muscle tone. Coordination normal.  Skin: Skin is warm and dry. No rash noted. He is not diaphoretic. No erythema. No pallor.  Psychiatric: He has a normal mood and affect. His behavior is normal. Judgment and thought content normal.  Nursing note and vitals reviewed.   ED Course  Procedures (including critical care time) DIAGNOSTIC STUDIES: Oxygen Saturation is 98% on RA, normal by my interpretation.    COORDINATION OF CARE: 11:57 PM- Pt advised of plan for treatment and pt agrees. Pt will receive tetanus vaccine and a laceration repair. Will provide work note for 2 days and recommended to follow up with PCP.    LACERATION REPAIR Performed by: Delsa Grana Consent: Verbal consent obtained. Risks and benefits: risks, benefits and alternatives were discussed Patient identity confirmed: provided demographic data Time out performed prior to procedure Prepped and Draped in normal sterile fashion Wound explored Laceration Location: right thigh Laceration Length: 1.5 cm No Foreign Bodies seen or palpated Anesthesia: local infiltration Local anesthetic: lidocaine 2% with epinephrine Anesthetic total: 5 ml Irrigation method: syringe Amount of cleaning: standard Skin closure: 4.0 Prolene Number of sutures or staples: 3 Technique:  Simple interupted Patient tolerance: Patient tolerated the procedure well with no immediate complications.    MDM   Final diagnoses:  Contusion of right thigh, initial encounter  Laceration    Contusion with laceration to right anterior mid thigh, with mild edema and bruising, tender to palpation, no injury of rt knee or hip  Tetanus updated, laceration repaired. Pt given work note, told to treat discomfort with tylenol and ibuprofen.  He will follow up with his PCP in 7-10 days for suture removal.   I personally performed the services described in this documentation, which was scribed in my presence. The recorded information has been reviewed and is accurate.     Delsa Grana, PA-C 03/06/15 [place], DO 03/06/15 [phone]

## 2015-03-05 NOTE — Discharge Instructions (Signed)
Contusion A contusion is a deep bruise. Contusions are the result of a blunt injury to tissues and muscle fibers under the skin. The injury causes bleeding under the skin. The skin overlying the contusion may turn blue, purple, or yellow. Minor injuries will give you a painless contusion, but more severe contusions may stay painful and swollen for a few weeks.  CAUSES  This condition is usually caused by a blow, trauma, or direct force to an area of the body. SYMPTOMS  Symptoms of this condition include:  Swelling of the injured area.  Pain and tenderness in the injured area.  Discoloration. The area may have redness and then turn blue, purple, or yellow. DIAGNOSIS  This condition is diagnosed based on a physical exam and medical history. An X-ray, CT scan, or MRI may be needed to determine if there are any associated injuries, such as broken bones (fractures). TREATMENT  Specific treatment for this condition depends on what area of the body was injured. In general, the best treatment for a contusion is resting, icing, applying pressure to (compression), and elevating the injured area. This is often called the RICE strategy. Over-the-counter anti-inflammatory medicines may also be recommended for pain control.  HOME CARE INSTRUCTIONS   Rest the injured area.  If directed, apply ice to the injured area:  Put ice in a plastic bag.  Place a towel between your skin and the bag.  Leave the ice on for 20 minutes, 2-3 times per day.  If directed, apply light compression to the injured area using an elastic bandage. Make sure the bandage is not wrapped too tightly. Remove and reapply the bandage as directed by your health care provider.  If possible, raise (elevate) the injured area above the level of your heart while you are sitting or lying down.  Take over-the-counter and prescription medicines only as told by your health care provider. SEEK MEDICAL CARE IF:  Your symptoms do not  improve after several days of treatment.  Your symptoms get worse.  You have difficulty moving the injured area. SEEK IMMEDIATE MEDICAL CARE IF:   You have severe pain.  You have numbness in a hand or foot.  Your hand or foot turns pale or cold.   This information is not intended to replace advice given to you by your health care provider. Make sure you discuss any questions you have with your health care provider.   Document Released: 12/30/2004 Document Revised: 12/11/2014 Document Reviewed: 08/07/2014 Elsevier Interactive Patient Education 2016 [employer]  Quadriceps Contusion A quadriceps contusion is a deep bruise of the large muscle in the front of your thigh. Contusions are the result of an injury that caused bleeding under the skin. The contusion may turn blue, purple, or yellow. Minor injuries will give you a painless contusion, but more severe contusions may stay painful and swollen for a few weeks. It is necessary to follow your caregiver's directions when this muscle is bruised.  CAUSES A quadriceps contusion comes from a blow or injury to the front of the leg. SYMPTOMS   Swelling and redness of the thigh area.  Bruising of the thigh area.  Tenderness or soreness of the thigh.  Limping.  Leg stiffness.  Difficulty bending the leg.  Trouble walking. DIAGNOSIS  You will have a physical exam and will be asked about your history. You may need an X-ray of your leg. TREATMENT  Often, the best treatment for a quadriceps contusion is resting and elevating the leg and  applying cold compresses to the thigh area. Over-the-counter medicines may also be recommended for pain control. You may need crutches, an elastic wrap, or a leg splint.  HOME CARE INSTRUCTIONS   Put ice on the injured area.  Put ice in a plastic bag.  Place a towel between your skin and the bag.  Leave the ice on for 15-20 minutes, 03-04 times a day.  Only take over-the-counter or prescription  medicines for pain, discomfort, or fever as directed by your caregiver.  Rest the injured thigh until the pain and swelling are better.  Elevate your leg to reduce swelling. Lie down flat on your back and place a pillow under your knee.  Apply compression wraps as directed by your caregiver. You may remove it for sleeping, showers, and baths. If your toes become numb, cold, or blue, take the wrap off and reapply it more loosely.  Walk or move around as the pain allows, or as directed by your caregiver. Resume full activities only when your caregiver says it is okay. Returning to your usual activities before your caregiver approves may cause worse damage to the muscle.  See your caregiver as directed. It is very important to keep all follow-up referrals and appointments in order to avoid any long-term problems with your leg, including chronic pain or inability to move your leg normally. SEEK MEDICAL CARE IF:   You have increased bruising or swelling.  You have pain that is getting worse.  Your swelling or pain is not relieved by medicines.  Your toes or foot become cold or turn bluish in color.  You notice your thigh getting larger in size. MAKE SURE YOU:   Understand these instructions.  Will watch your condition.  Will get help right away if you are not doing well or get worse.   This information is not intended to replace advice given to you by your health care provider. Make sure you discuss any questions you have with your health care provider.   Document Released: 12/15/2000 Document Revised: 04/12/2014 Document Reviewed: 08/07/2014 Elsevier Interactive Patient Education 2016 [place], Adult A laceration is a cut that goes through all of the layers of the skin and into the tissue that is right under the skin. Some lacerations heal on their own. Others need to be closed with stitches (sutures), staples, skin adhesive strips, or skin glue. Proper laceration  care minimizes the risk of infection and helps the laceration to heal better. HOW TO CARE FOR YOUR LACERATION If sutures or staples were used:  Keep the wound clean and dry.  If you were given a bandage (dressing), you should change it at least one time per day or as told by your health care provider. You should also change it if it becomes wet or dirty.  Keep the wound completely dry for the first 24 hours or as told by your health care provider. After that time, you may shower or bathe. However, make sure that the wound is not soaked in water until after the sutures or staples have been removed.  Clean the wound one time each day or as told by your health care provider:  Wash the wound with soap and water.  Rinse the wound with water to remove all soap.  Pat the wound dry with a clean towel. Do not rub the wound.  After cleaning the wound, apply a thin layer of antibiotic ointmentas told by your health care provider. This will help to prevent infection  and keep the dressing from sticking to the wound.  Have the sutures or staples removed as told by your health care provider. If skin adhesive strips were used:  Keep the wound clean and dry.  If you were given a bandage (dressing), you should change it at least one time per day or as told by your health care provider. You should also change it if it becomes dirty or wet.  Do not get the skin adhesive strips wet. You may shower or bathe, but be careful to keep the wound dry.  If the wound gets wet, pat it dry with a clean towel. Do not rub the wound.  Skin adhesive strips fall off on their own. You may trim the strips as the wound heals. Do not remove skin adhesive strips that are still stuck to the wound. They will fall off in time. If skin glue was used:  Try to keep the wound dry, but you may briefly wet it in the shower or bath. Do not soak the wound in water, such as by swimming.  After you have showered or bathed, gently pat  the wound dry with a clean towel. Do not rub the wound.  Do not do any activities that will make you sweat heavily until the skin glue has fallen off on its own.  Do not apply liquid, cream, or ointment medicine to the wound while the skin glue is in place. Using those may loosen the film before the wound has healed.  If you were given a bandage (dressing), you should change it at least one time per day or as told by your health care provider. You should also change it if it becomes dirty or wet.  If a dressing is placed over the wound, be careful not to apply tape directly over the skin glue. Doing that may cause the glue to be pulled off before the wound has healed.  Do not pick at the glue. The skin glue usually remains in place for 5-10 days, then it falls off of the skin. General Instructions  Take over-the-counter and prescription medicines only as told by your health care provider.  If you were prescribed an antibiotic medicine or ointment, take or apply it as told by your doctor. Do not stop using it even if your condition improves.  To help prevent scarring, make sure to cover your wound with sunscreen whenever you are outside after stitches are removed, after adhesive strips are removed, or when glue remains in place and the wound is healed. Make sure to wear a sunscreen of at least 30 SPF.  Do not scratch or pick at the wound.  Keep all follow-up visits as told by your health care provider. This is important.  Check your wound every day for signs of infection. Watch for:  Redness, swelling, or pain.  Fluid, blood, or pus.  Raise (elevate) the injured area above the level of your heart while you are sitting or lying down, if possible. SEEK MEDICAL CARE IF:  You received a tetanus shot and you have swelling, severe pain, redness, or bleeding at the injection site.  You have a fever.  A wound that was closed breaks open.  You notice a bad smell coming from your wound or  your dressing.  You notice something coming out of the wound, such as wood or glass.  Your pain is not controlled with medicine.  You have increased redness, swelling, or pain at the site of your wound.  You have fluid, blood, or pus coming from your wound.  You notice a change in the color of your skin near your wound.  You need to change the dressing frequently due to fluid, blood, or pus draining from the wound.  You develop a new rash.  You develop numbness around the wound. SEEK IMMEDIATE MEDICAL CARE IF:  You develop severe swelling around the wound.  Your pain suddenly increases and is severe.  You develop painful lumps near the wound or on skin that is anywhere on your body.  You have a red streak going away from your wound.  The wound is on your hand or foot and you cannot properly move a finger or toe.  The wound is on your hand or foot and you notice that your fingers or toes look pale or bluish.   This information is not intended to replace advice given to you by your health care provider. Make sure you discuss any questions you have with your health care provider.   Document Released: 03/22/2005 Document Revised: 08/06/2014 Document Reviewed: 03/18/2014 Elsevier Interactive Patient Education [employer].

## 2015-03-17 ENCOUNTER — Encounter: Payer: Self-pay | Admitting: Family Medicine

## 2015-03-17 ENCOUNTER — Ambulatory Visit (INDEPENDENT_AMBULATORY_CARE_PROVIDER_SITE_OTHER): Payer: BLUE CROSS/BLUE SHIELD | Admitting: Family Medicine

## 2015-03-17 VITALS — BP 110/80 | HR 91 | Temp 98.5°F | Resp 14 | Ht 70.0 in | Wt 213.9 lb

## 2015-03-17 DIAGNOSIS — E119 Type 2 diabetes mellitus without complications: Secondary | ICD-10-CM

## 2015-03-17 LAB — HEMOGLOBIN A1C: HEMOGLOBIN A1C: 6.4 % (ref 4.6–6.5)

## 2015-03-17 NOTE — Progress Notes (Signed)
   Subjective:    Patient ID: Richard Carroll, male    DOB: May 30, 1970, 44 y.o.   MRN: [id number]  HPI Follow-up type 2 diabetes We finally achieved good control with addition of invokana Unfortunately, he was unable continue with this because of cost with insurance. Not taking invokana over the past couple of months. He does remain on metformin and glimepiride. Does not monitor sugars regularly. No polyuria or polydipsia. No recent hypoglycemia. Gets yearly eye exams.  Works 2 jobs and very little exercise.  Past Medical History  Diagnosis Date  . Arthritis   . Diabetes mellitus type II   . Hay fever     with allergies  . Hyperlipidemia   . DIABETES MELLITUS, TYPE II 08/18/2009  . HYPERLIPIDEMIA 08/18/2009   Past Surgical History  Procedure Laterality Date  . Knee surgery  2005    r. knee    reports that he has never smoked. His smokeless tobacco use includes Chew. He reports that he drinks alcohol. He reports that he does not use illicit drugs. family history includes Alcohol abuse in his maternal grandfather; Diabetes in his maternal grandmother and mother; Heart disease in his father; Hyperlipidemia in his mother; Hypertension in his mother; Prostate cancer in his father; Stroke in his mother. No Known Allergies    Review of Systems  Constitutional: Negative for fatigue.  Eyes: Negative for visual disturbance.  Respiratory: Negative for cough, chest tightness and shortness of breath.   Cardiovascular: Negative for chest pain, palpitations and leg swelling.  Endocrine: Negative for polydipsia and polyuria.  Neurological: Negative for dizziness, syncope, weakness, light-headedness and headaches.       Objective:   Physical Exam  Constitutional: He is oriented to person, place, and time. He appears well-developed and well-nourished.  HENT:  Right Ear: External ear normal.  Left Ear: External ear normal.  Mouth/Throat: Oropharynx is clear and moist.  Eyes: Pupils  are equal, round, and reactive to light.  Neck: Neck supple. No thyromegaly present.  Cardiovascular: Normal rate and regular rhythm.   Pulmonary/Chest: Effort normal and breath sounds normal. No respiratory distress. He has no wheezes. He has no rales.  Musculoskeletal: He exhibits no edema.  Neurological: He is alert and oriented to person, place, and time.          Assessment & Plan:  Type 2 diabetes. History of recent good control the poor compliance of medication with invokana because of cost issues. Suspect A1c will be back up. Recheck A1c today. He will check to see what alternatives are offered by insurance.  Reminder to continue with yearly eye exam.

## 2015-03-17 NOTE — Progress Notes (Signed)
Pre visit review using our clinic review tool, if applicable. No additional management support is needed unless otherwise documented below in the visit note. 

## 2015-06-24 ENCOUNTER — Other Ambulatory Visit: Payer: Self-pay | Admitting: Family Medicine

## 2015-06-24 MED ORDER — SIMVASTATIN 80 MG PO TABS: 40.0000 mg | ORAL_TABLET | Freq: Every day | ORAL | Status: DC

## 2015-06-24 MED ORDER — METFORMIN HCL 500 MG PO TABS: ORAL_TABLET | ORAL | Status: DC

## 2015-07-14 ENCOUNTER — Ambulatory Visit: Payer: BLUE CROSS/BLUE SHIELD | Admitting: Family Medicine

## 2015-07-15 ENCOUNTER — Ambulatory Visit: Payer: Self-pay | Admitting: Family Medicine

## 2015-07-17 ENCOUNTER — Ambulatory Visit (INDEPENDENT_AMBULATORY_CARE_PROVIDER_SITE_OTHER): Payer: BLUE CROSS/BLUE SHIELD | Admitting: Family Medicine

## 2015-07-17 ENCOUNTER — Encounter: Payer: Self-pay | Admitting: Family Medicine

## 2015-07-17 VITALS — BP 120/78 | HR 112 | Temp 98.9°F | Ht 70.0 in | Wt 210.0 lb

## 2015-07-17 DIAGNOSIS — E119 Type 2 diabetes mellitus without complications: Secondary | ICD-10-CM | POA: Diagnosis not present

## 2015-07-17 LAB — POCT GLYCOSYLATED HEMOGLOBIN (HGB A1C): Hemoglobin A1C: 7

## 2015-07-17 NOTE — Progress Notes (Signed)
   Subjective:    Patient ID: Richard Carroll, male    DOB: 1971-01-07, 45 y.o.   MRN: [id number]  HPI  Follow-up regarding type 2 diabetes.  Currently working 2 jobs per day -generally between 13-16 hours. No time for exercise. Remains on metformin, simvastatin, and Amaryl. No recent hypoglycemic symptoms. No polyuria or polydipsia. Last A1c 6.4%. Denies neuropathy symptoms.  Past Medical History  Diagnosis Date  . Arthritis   . Diabetes mellitus type II   . Hay fever     with allergies  . Hyperlipidemia   . DIABETES MELLITUS, TYPE II 08/18/2009  . HYPERLIPIDEMIA 08/18/2009   Past Surgical History  Procedure Laterality Date  . Knee surgery  2005    r. knee    reports that he has never smoked. His smokeless tobacco use includes Chew. He reports that he drinks alcohol. He reports that he does not use illicit drugs. family history includes Alcohol abuse in his maternal grandfather; Diabetes in his maternal grandmother and mother; Heart disease in his father; Hyperlipidemia in his mother; Hypertension in his mother; Prostate cancer in his father; Stroke in his mother. No Known Allergies    Review of Systems  Constitutional: Negative for fatigue.  Eyes: Negative for visual disturbance.  Respiratory: Negative for cough, chest tightness and shortness of breath.   Cardiovascular: Negative for chest pain, palpitations and leg swelling.  Endocrine: Negative for polydipsia and polyuria.  Neurological: Negative for dizziness, syncope, weakness, light-headedness and headaches.       Objective:   Physical Exam  Constitutional: He is oriented to person, place, and time. He appears well-developed and well-nourished.  HENT:  Right Ear: External ear normal.  Left Ear: External ear normal.  Mouth/Throat: Oropharynx is clear and moist.  Eyes: Pupils are equal, round, and reactive to light.  Neck: Neck supple. No thyromegaly present.  Cardiovascular: Normal rate and regular rhythm.     Pulmonary/Chest: Effort normal and breath sounds normal. No respiratory distress. He has no wheezes. He has no rales.  Musculoskeletal: He exhibits no edema.  Neurological: He is alert and oriented to person, place, and time.          Assessment & Plan:   Type 2 diabetes. Good control recently. Not monitoring at home. Recheck A1c A1C 7.0.  We have elected to make no changes at this time.  Follow up in 4  Months and reassess then.  We discussed reducing sugars and starches.

## 2015-07-17 NOTE — Progress Notes (Signed)
Pre visit review using our clinic review tool, if applicable. No additional management support is needed unless otherwise documented below in the visit note. 

## 2015-10-31 LAB — HM DIABETES EYE EXAM

## 2015-11-10 ENCOUNTER — Ambulatory Visit (INDEPENDENT_AMBULATORY_CARE_PROVIDER_SITE_OTHER): Payer: BLUE CROSS/BLUE SHIELD | Admitting: Family Medicine

## 2015-11-10 ENCOUNTER — Encounter: Payer: Self-pay | Admitting: Family Medicine

## 2015-11-10 VITALS — BP 110/80 | HR 73 | Temp 97.8°F | Ht 70.0 in | Wt 208.8 lb

## 2015-11-10 DIAGNOSIS — E119 Type 2 diabetes mellitus without complications: Secondary | ICD-10-CM

## 2015-11-10 DIAGNOSIS — E785 Hyperlipidemia, unspecified: Secondary | ICD-10-CM | POA: Diagnosis not present

## 2015-11-10 LAB — HEPATIC FUNCTION PANEL
ALBUMIN: 4.3 g/dL (ref 3.5–5.2)
ALK PHOS: 49 U/L (ref 39–117)
ALT: 16 U/L (ref 0–53)
AST: 11 U/L (ref 0–37)
Bilirubin, Direct: 0.1 mg/dL (ref 0.0–0.3)
Total Bilirubin: 0.5 mg/dL (ref 0.2–1.2)
Total Protein: 6.5 g/dL (ref 6.0–8.3)

## 2015-11-10 LAB — LIPID PANEL
CHOLESTEROL: 136 mg/dL (ref 0–200)
HDL: 46.4 mg/dL (ref >39.00)
LDL Cholesterol: 68 mg/dL (ref 0–99)
NonHDL: 90.08
TRIGLYCERIDES: 111 mg/dL (ref 0.0–149.0)
Total CHOL/HDL Ratio: 3
VLDL: 22.2 mg/dL (ref 0.0–40.0)

## 2015-11-10 LAB — BASIC METABOLIC PANEL
BUN: 12 mg/dL (ref 6–23)
CALCIUM: 9.4 mg/dL (ref 8.4–10.5)
CO2: 28 mEq/L (ref 19–32)
CREATININE: 0.89 mg/dL (ref 0.40–1.50)
Chloride: 102 mEq/L (ref 96–112)
GFR: 98.82 mL/min (ref >60.00)
Glucose, Bld: 214 mg/dL — ABNORMAL HIGH (ref 70–99)
Potassium: 4.6 mEq/L (ref 3.5–5.1)
Sodium: 137 mEq/L (ref 135–145)

## 2015-11-10 LAB — MICROALBUMIN / CREATININE URINE RATIO
CREATININE, U: 92.7 mg/dL
Microalb Creat Ratio: 0.8 mg/g (ref 0.0–30.0)

## 2015-11-10 LAB — HEMOGLOBIN A1C: HEMOGLOBIN A1C: 8.9 % — AB (ref 4.6–6.5)

## 2015-11-10 MED ORDER — SIMVASTATIN 80 MG PO TABS: 40.0000 mg | ORAL_TABLET | Freq: Every day | ORAL | 2 refills | Status: DC

## 2015-11-10 MED ORDER — METFORMIN HCL 500 MG PO TABS: ORAL_TABLET | ORAL | 2 refills | Status: DC

## 2015-11-10 MED ORDER — GLIMEPIRIDE 4 MG PO TABS: 4.0000 mg | ORAL_TABLET | Freq: Every day | ORAL | 2 refills | Status: DC

## 2015-11-10 MED ORDER — TRIAMCINOLONE ACETONIDE 0.1 % EX CREA: 1.0000 "application " | TOPICAL_CREAM | Freq: Two times a day (BID) | CUTANEOUS | 3 refills | Status: DC

## 2015-11-10 NOTE — Progress Notes (Signed)
Pre visit review using our clinic review tool, if applicable. No additional management support is needed unless otherwise documented below in the visit note. 

## 2015-11-10 NOTE — Progress Notes (Signed)
Subjective:     Patient ID: Richard Carroll, male   DOB: Mar 16, 1971, 45 y.o.   MRN: [id number]  HPI Patient seen for routine medical follow-up  Type 2 diabetes. Not monitoring blood sugars. Last A1c 7%. He's lost some weight due to physical activity from one of his jobs.  Rare hypoglycemic symptoms  Takes simvastatin for hyperlipidemia. No myalgias. No recent chest pains.  Past Medical History:  Diagnosis Date  . Arthritis   . Diabetes mellitus type II   . DIABETES MELLITUS, TYPE II 08/18/2009  . Hay fever    with allergies  . Hyperlipidemia   . HYPERLIPIDEMIA 08/18/2009   Past Surgical History:  Procedure Laterality Date  . KNEE SURGERY  2005   r. knee    reports that he has never smoked. His smokeless tobacco use includes Chew. He reports that he drinks alcohol. He reports that he does not use drugs. family history includes Alcohol abuse in his maternal grandfather; Diabetes in his maternal grandmother and mother; Heart disease in his father; Hyperlipidemia in his mother; Hypertension in his mother; Prostate cancer in his father; Stroke in his mother. No Known Allergies   Review of Systems  Constitutional: Negative for fatigue.  Eyes: Negative for visual disturbance.  Respiratory: Negative for cough, chest tightness and shortness of breath.   Cardiovascular: Negative for chest pain, palpitations and leg swelling.  Endocrine: Negative for polydipsia and polyuria.  Neurological: Negative for dizziness, syncope, weakness, light-headedness and headaches.       Objective:   Physical Exam  Constitutional: He appears well-developed and well-nourished.  Neck: Neck supple. No thyromegaly present.  Cardiovascular: Normal rate and regular rhythm.  Exam reveals no gallop.   No murmur heard. Pulmonary/Chest: Effort normal and breath sounds normal. No respiratory distress. He has no wheezes. He has no rales.  Musculoskeletal: He exhibits no edema.       Assessment:     #1  type 2 diabetes. History of good control  #2 hyperlipidemia    Plan:     -Recheck labs with hemoglobin [id number], basic metabolic panel, lipid panel, hepatic panel, urine microalbumin -Recommend continue yearly eye exam -Plan routine follow-up in 6 months consider as needed  Eulas Post MD [facility]

## 2015-12-09 DIAGNOSIS — L309 Dermatitis, unspecified: Secondary | ICD-10-CM | POA: Diagnosis not present

## 2015-12-15 ENCOUNTER — Encounter (HOSPITAL_COMMUNITY): Payer: Self-pay | Admitting: Emergency Medicine

## 2015-12-15 ENCOUNTER — Emergency Department (HOSPITAL_COMMUNITY): Payer: BLUE CROSS/BLUE SHIELD

## 2015-12-15 ENCOUNTER — Emergency Department (HOSPITAL_COMMUNITY)
Admission: EM | Admit: 2015-12-15 | Discharge: 2015-12-15 | Disposition: A | Discharge status: Home / Self Care | Payer: BLUE CROSS/BLUE SHIELD | Attending: Emergency Medicine | Admitting: Emergency Medicine

## 2015-12-15 DIAGNOSIS — R109 Unspecified abdominal pain: Secondary | ICD-10-CM

## 2015-12-15 DIAGNOSIS — E119 Type 2 diabetes mellitus without complications: Secondary | ICD-10-CM | POA: Diagnosis not present

## 2015-12-15 DIAGNOSIS — Z7984 Long term (current) use of oral hypoglycemic drugs: Secondary | ICD-10-CM | POA: Diagnosis not present

## 2015-12-15 DIAGNOSIS — R1032 Left lower quadrant pain: Secondary | ICD-10-CM | POA: Diagnosis not present

## 2015-12-15 DIAGNOSIS — F17220 Nicotine dependence, chewing tobacco, uncomplicated: Secondary | ICD-10-CM | POA: Insufficient documentation

## 2015-12-15 DIAGNOSIS — K5732 Diverticulitis of large intestine without perforation or abscess without bleeding: Secondary | ICD-10-CM | POA: Diagnosis not present

## 2015-12-15 DIAGNOSIS — Z79899 Other long term (current) drug therapy: Secondary | ICD-10-CM | POA: Diagnosis not present

## 2015-12-15 LAB — URINALYSIS, ROUTINE W REFLEX MICROSCOPIC
Bilirubin Urine: NEGATIVE
Glucose, UA: 100 mg/dL — AB
Hgb urine dipstick: NEGATIVE
Ketones, ur: NEGATIVE mg/dL
LEUKOCYTES UA: NEGATIVE
Nitrite: NEGATIVE
PROTEIN: NEGATIVE mg/dL
SPECIFIC GRAVITY, URINE: 1.021 (ref 1.005–1.030)
pH: 6.5 (ref 5.0–8.0)

## 2015-12-15 LAB — COMPREHENSIVE METABOLIC PANEL
ALT: 17 U/L (ref 17–63)
ANION GAP: 8 (ref 5–15)
AST: 14 U/L — ABNORMAL LOW (ref 15–41)
Albumin: 4.5 g/dL (ref 3.5–5.0)
Alkaline Phosphatase: 61 U/L (ref 38–126)
BUN: 15 mg/dL (ref 6–20)
CHLORIDE: 104 mmol/L (ref 101–111)
CO2: 26 mmol/L (ref 22–32)
Calcium: 9.1 mg/dL (ref 8.9–10.3)
Creatinine, Ser: 0.91 mg/dL (ref 0.61–1.24)
Glucose, Bld: 138 mg/dL — ABNORMAL HIGH (ref 65–99)
POTASSIUM: 4 mmol/L (ref 3.5–5.1)
SODIUM: 138 mmol/L (ref 135–145)
Total Bilirubin: 0.4 mg/dL (ref 0.3–1.2)
Total Protein: 7.4 g/dL (ref 6.5–8.1)

## 2015-12-15 LAB — CBC
HCT: 45.5 % (ref 39.0–52.0)
HEMOGLOBIN: 15.7 g/dL (ref 13.0–17.0)
MCH: 30.7 pg (ref 26.0–34.0)
MCHC: 34.5 g/dL (ref 30.0–36.0)
MCV: 88.9 fL (ref 78.0–100.0)
PLATELETS: 243 10*3/uL (ref 150–400)
RBC: 5.12 MIL/uL (ref 4.22–5.81)
RDW: 12.5 % (ref 11.5–15.5)
WBC: 14.2 10*3/uL — AB (ref 4.0–10.5)

## 2015-12-15 LAB — LIPASE, BLOOD: LIPASE: 22 U/L (ref 11–51)

## 2015-12-15 MED ORDER — IOPAMIDOL (ISOVUE-300) INJECTION 61%
100.0000 mL | Freq: Once | INTRAVENOUS | Status: AC | PRN
  Administered 2015-12-15: 100 mL via INTRAVENOUS

## 2015-12-15 MED ORDER — CIPROFLOXACIN HCL 500 MG PO TABS
500.0000 mg | ORAL_TABLET | Freq: Once | ORAL | Status: AC
  Administered 2015-12-15: 500 mg via ORAL
  Filled 2015-12-15: qty 1

## 2015-12-15 MED ORDER — METRONIDAZOLE 500 MG PO TABS: 500.0000 mg | ORAL_TABLET | Freq: Three times a day (TID) | ORAL | 0 refills | Status: AC

## 2015-12-15 MED ORDER — METRONIDAZOLE 500 MG PO TABS
500.0000 mg | ORAL_TABLET | Freq: Once | ORAL | Status: AC
  Administered 2015-12-15: 500 mg via ORAL
  Filled 2015-12-15: qty 1

## 2015-12-15 MED ORDER — CIPROFLOXACIN HCL 500 MG PO TABS: 500.0000 mg | ORAL_TABLET | Freq: Two times a day (BID) | ORAL | 0 refills | Status: AC

## 2015-12-15 MED ORDER — IOPAMIDOL (ISOVUE-300) INJECTION 61%
15.0000 mL | Freq: Once | INTRAVENOUS | Status: AC | PRN
  Administered 2015-12-15: 15 mL via ORAL

## 2015-12-15 NOTE — Discharge Instructions (Signed)
CT scan of the abdomen revealed diverticulitis. Take the antibiotics as prescribed and be sure to complete the entire 10 day course. Do not drink alcohol while taking Flagyl as it causes vomiting. Follow-up with your primary care provider in 2-3 days. Eat a bland diet for the next 4 days.  Return to the emergency department if you experience worsening pain, fever, diarrhea, blood in your stool, vomiting, or any other concerning symptoms.

## 2015-12-15 NOTE — ED Notes (Signed)
PT DISCHARGED. INSTRUCTIONS AND PRESCRIPTIONS GIVEN. AAOX4. PT IN NO APPARENT DISTRESS OR PAIN. THE OPPORTUNITY TO ASK QUESTIONS WAS PROVIDED. 

## 2015-12-15 NOTE — ED Provider Notes (Signed)
[facility] DEPT Provider Note   CSN: [id number] Arrival date & time: 12/15/15  1637     History   Chief Complaint Chief Complaint  Patient presents with  . Abdominal Pain    HPI Richard Carroll is a 45 y.o. male.  HPI   Pt is a 45 year old male with a history of DM type II, hyperlipidemia who presents the emergency department with progressively worsening left flank pain since last night. Pain radiates medially to the umbilicus, pain is constant, dull (4/10) with intermittent sharp/stabbing (10/10) pains. Patient Took naproxen with little relief. Patient denies nausea, vomiting, diarrhea, constipation, headache, dizziness, fever.  Past Medical History:  Diagnosis Date  . Arthritis   . Diabetes mellitus type II   . DIABETES MELLITUS, TYPE II 08/18/2009  . Hay fever    with allergies  . Hyperlipidemia   . HYPERLIPIDEMIA 08/18/2009    Patient Active Problem List   Diagnosis Date Noted  . Type 2 diabetes mellitus, controlled ([place]) 09/19/2014  . Acute sinus infection 07/09/2014  . Seborrheic dermatitis 06/16/2013  . Obesity (BMI 30-39.9) 12/16/2012  . Chronic eczema 12/15/2012  . Allergic rhinitis 12/18/2010  . Hyperlipidemia 08/18/2009    Past Surgical History:  Procedure Laterality Date  . KNEE SURGERY  2005   r. knee       Home Medications    Prior to Admission medications   Medication Sig Start Date End Date Taking? Authorizing Provider  augmented betamethasone dipropionate (DIPROLENE) 0.05 % ointment Apply twice daily as needed no longer than 2 weeks of continuous use 11/26/13   Eulas Post, MD  azelastine (ASTELIN) 137 MCG/SPRAY nasal spray Place 2 sprays into the nose 2 (two) times daily as needed. 07/31/12   Eulas Post, MD  B Complex-Biotin-FA (BIG 100) TABS Take by mouth. Takes 1 once per day    Historical Provider, MD  ciprofloxacin (CIPRO) 500 MG tablet Take 1 tablet (500 mg total) by mouth 2 (two) times daily. 12/15/15 12/25/15  Kalman Drape, PA  glimepiride (AMARYL) 4 MG tablet Take 1 tablet (4 mg total) by mouth daily before breakfast. 11/10/15   Eulas Post, MD  glucose blood (ACCU-CHEK AVIVA) test strip 1 each by Other route as directed. Use as instructed     Historical Provider, MD  metFORMIN (GLUCOPHAGE) 500 MG tablet TAKE 2 TABLETS BY MOUTH TWICE A DAY WITH MEALS 11/10/15   Eulas Post, MD  metroNIDAZOLE (FLAGYL) 500 MG tablet Take 1 tablet (500 mg total) by mouth 3 (three) times daily. 12/15/15 12/25/15  Kalman Drape, PA  simvastatin (ZOCOR) 80 MG tablet Take 0.5 tablets (40 mg total) by mouth daily. 11/10/15   Eulas Post, MD  triamcinolone (NASACORT AQ) 55 MCG/ACT AERO nasal inhaler Place 2 sprays into the nose daily. 07/09/14   Biagio Borg, MD  triamcinolone cream (KENALOG) 0.1 % Apply 1 application topically 2 (two) times daily. 11/10/15   Eulas Post, MD    Family History Family History  Problem Relation Age of Onset  . Hyperlipidemia Mother   . Stroke Mother   . Hypertension Mother   . Diabetes Mother   . Heart disease Father   . Prostate cancer Father   . Diabetes Maternal Grandmother   . Alcohol abuse Maternal Grandfather     Social History Social History  Substance Use Topics  . Smoking status: Never Smoker  . Smokeless tobacco: Current User    Types: Chew  . Alcohol use  Yes     Allergies   Review of patient's allergies indicates no known allergies.   Review of Systems Review of Systems  Constitutional: Negative for chills and fever.  HENT: Negative for sore throat and trouble swallowing.   Eyes: Negative for visual disturbance.  Respiratory: Negative for cough, chest tightness and shortness of breath.   Cardiovascular: Negative for chest pain.  Gastrointestinal: Positive for abdominal pain. Negative for blood in stool, constipation, diarrhea, nausea and vomiting.  Genitourinary: Positive for flank pain. Negative for discharge, dysuria, hematuria, penile pain, penile  swelling, scrotal swelling, testicular pain and urgency.  Musculoskeletal: Negative for back pain and neck pain.  Skin: Negative for rash.  Neurological: Negative for dizziness, syncope and headaches.     Physical Exam Updated Vital Signs BP 127/88 (BP Location: Left Arm)   Pulse 93   Temp 99.1 F (37.3 C) (Oral)   Resp 18   SpO2 98%   Physical Exam  Constitutional: He appears well-developed and well-nourished. No distress.  HENT:  Head: Normocephalic and atraumatic.  Eyes: Conjunctivae are normal.  Neck: Normal range of motion. Neck supple.  Cardiovascular: Normal rate, regular rhythm and normal heart sounds.  Exam reveals no gallop and no friction rub.   No murmur heard. Pulmonary/Chest: Effort normal and breath sounds normal. No respiratory distress. He has no wheezes. He has no rales.  Abdominal: Soft. Normal appearance and bowel sounds are normal. He exhibits no distension. There is tenderness in the left lower quadrant. There is no rigidity, no rebound, no guarding, no CVA tenderness and no tenderness at McBurney's point.  Musculoskeletal: Normal range of motion.  Neurological: He is alert. Coordination normal.  Skin: Skin is warm and dry. He is not diaphoretic.  Psychiatric: He has a normal mood and affect. His behavior is normal.  Nursing note and vitals reviewed.    ED Treatments / Results  Labs (all labs ordered are listed, but only abnormal results are displayed) Labs Reviewed  COMPREHENSIVE METABOLIC PANEL - Abnormal; Notable for the following:       Result Value   Glucose, Bld 138 (*)    AST 14 (*)    All other components within normal limits  CBC - Abnormal; Notable for the following:    WBC 14.2 (*)    All other components within normal limits  URINALYSIS, ROUTINE W REFLEX MICROSCOPIC (NOT AT [facility]) - Abnormal; Notable for the following:    Glucose, UA 100 (*)    All other components within normal limits  LIPASE, BLOOD    EKG  EKG  Interpretation None       Radiology Ct Abdomen Pelvis W Contrast  Result Date: 12/15/2015 CLINICAL DATA:  Sharp left lower quadrant abdominal pain since last night. Flank pain. EXAM: CT ABDOMEN AND PELVIS WITH CONTRAST TECHNIQUE: Multidetector CT imaging of the abdomen and pelvis was performed using the standard protocol following bolus administration of intravenous contrast. CONTRAST:  56mL ISOVUE-300 IOPAMIDOL (ISOVUE-300) INJECTION 61%, 133mL ISOVUE-300 IOPAMIDOL (ISOVUE-300) INJECTION 61% COMPARISON:  None. FINDINGS: Lower chest: Minimal bilateral dependent atelectasis. Hepatobiliary: No focal liver abnormality is seen. No gallstones, gallbladder wall thickening, or biliary dilatation. Pancreas: Unremarkable. No pancreatic ductal dilatation or surrounding inflammatory changes. Spleen: Normal in size without focal abnormality. Adrenals/Urinary Tract: Adrenal glands are unremarkable. Small upper pole right renal cyst. Otherwise, the kidneys are normal, without renal calculi, focal lesion, or hydronephrosis. Bladder is unremarkable. Stomach/Bowel: Multiple diverticula in the proximal sigmoid and distal descending colon. There is ill-defined adjacent  soft tissue stranding at the level of the proximal sigmoid colon with adjacent fascial thickening. No fluid collections or free peritoneal air are seen. Normal appearing appendix, stomach and small bowel. Vascular/Lymphatic: No significant vascular findings are present. No enlarged abdominal or pelvic lymph nodes. Reproductive: Prostate is unremarkable. Other: Tiny umbilical hernia containing fat. Musculoskeletal: Mild lumbar and moderate lower thoracic spine degenerative changes. Left femoral head and right femoral neck bone islands. IMPRESSION: 1. Proximal sigmoid diverticulitis without abscess. 2. Sigmoid and descending colon diverticulosis. Electronically Signed   By: Claudie Revering M.D.   On: 12/15/2015 21:56    Procedures Procedures (including critical  care time)  Medications Ordered in ED Medications  metroNIDAZOLE (FLAGYL) tablet 500 mg (not administered)  ciprofloxacin (CIPRO) tablet 500 mg (not administered)  iopamidol (ISOVUE-300) 61 % injection 15 mL (15 mLs Oral Contrast Given 12/15/15 2055)  iopamidol (ISOVUE-300) 61 % injection 100 mL (100 mLs Intravenous Contrast Given 12/15/15 2137)     Initial Impression / Assessment and Plan / ED Course  I have reviewed the triage vital signs and the nursing notes.  Pertinent labs & imaging results that were available during my care of the patient were reviewed by me and considered in my medical decision making (see chart for details).  Clinical Course   Patient with left flank pain. CT of abdomen reviewed by me revealed diverticulitis. Mild leukocytosis. Afebrile. VSS No concern for perforation or abscess at this time. Patient was given a dose of Cipro and Flagyl prior to discharge. Patient was instructed to follow-up with his primary care provider in 2-3 days. Discussed side effects of Cipro to include tendon rupture and informed patient to avoid alcohol while taking Flagyl. Discussed strict return precautions and following a bland diet for 4 days. Patient stable at time of discharge. Patient expressed understanding to the discharge instructions.  Final Clinical Impressions(s) / ED Diagnoses   Final diagnoses:  Diverticulitis of large intestine without perforation or abscess without bleeding  Left flank pain    New Prescriptions New Prescriptions   CIPROFLOXACIN (CIPRO) 500 MG TABLET    Take 1 tablet (500 mg total) by mouth 2 (two) times daily.   METRONIDAZOLE (FLAGYL) 500 MG TABLET    Take 1 tablet (500 mg total) by mouth 3 (three) times daily.     Kalman Drape, PA 12/15/15 2247    Lacretia Leigh, MD 12/16/15 [phone]

## 2015-12-15 NOTE — ED Triage Notes (Signed)
Pt c/o [facility] LLQ abdominal pain since last night. Denies urinary symptoms, flank pain. Pt denies N/V/D. Pain worsens with movement and palpation. A&Ox4 and ambulatory.

## 2015-12-17 ENCOUNTER — Ambulatory Visit (INDEPENDENT_AMBULATORY_CARE_PROVIDER_SITE_OTHER): Payer: BLUE CROSS/BLUE SHIELD | Admitting: Family Medicine

## 2015-12-17 ENCOUNTER — Encounter: Payer: Self-pay | Admitting: Family Medicine

## 2015-12-17 VITALS — BP 120/90 | HR 120 | Temp 98.7°F | Ht 70.0 in | Wt 210.1 lb

## 2015-12-17 DIAGNOSIS — K5792 Diverticulitis of intestine, part unspecified, without perforation or abscess without bleeding: Secondary | ICD-10-CM | POA: Diagnosis not present

## 2015-12-17 DIAGNOSIS — E119 Type 2 diabetes mellitus without complications: Secondary | ICD-10-CM | POA: Diagnosis not present

## 2015-12-17 NOTE — Progress Notes (Signed)
Pre visit review using our clinic review tool, if applicable. No additional management support is needed unless otherwise documented below in the visit note. 

## 2015-12-17 NOTE — Progress Notes (Signed)
Subjective:     Patient ID: Richard Carroll, male   DOB: [birth year], 45 y.o.   MRN: [id number]  HPI Patient here for follow-up from recent ER visit. He developed some left lower quadrant abdominal pain Sunday night. This had progressed and was fairly severe by Monday. He had some slight nausea but no vomiting. He initially had 4/10 pain but this became much more sharp and 10 out of 10 severity. He had taken naproxen without relief. He had no reported fever. Went to ER thinking he might have kidney stone. CT scan showed evidence for diverticulitis. No evidence for perforation. No kidney stone. No prior known history of diverticulosis. White blood count 14.2 thousand.  Patient started on Flagyl and Cipro and is slightly improved symptomatically. He still has significant soreness but no vomiting. No fever. No chills.  He reports family history of prostate cancer and colon cancer in his father. Father was in his 76s when he had colon cancer  Past Medical History:  Diagnosis Date  . Arthritis   . Diabetes mellitus type II   . DIABETES MELLITUS, TYPE II 08/18/2009  . Hay fever    with allergies  . Hyperlipidemia   . HYPERLIPIDEMIA 08/18/2009   Past Surgical History:  Procedure Laterality Date  . KNEE SURGERY  2005   r. knee    reports that he has never smoked. His smokeless tobacco use includes Chew. He reports that he drinks alcohol. He reports that he does not use drugs. family history includes Alcohol abuse in his maternal grandfather; Diabetes in his maternal grandmother and mother; Heart disease in his father; Hyperlipidemia in his mother; Hypertension in his mother; Prostate cancer in his father; Stroke in his mother. No Known Allergies   Review of Systems  Constitutional: Negative for chills and fever.  Respiratory: Negative for shortness of breath.   Cardiovascular: Negative for chest pain.  Gastrointestinal: Positive for abdominal pain. Negative for diarrhea, nausea and vomiting.   Endocrine: Negative for polydipsia and polyuria.  Genitourinary: Negative for dysuria.       Objective:   Physical Exam  Constitutional: He appears well-developed and well-nourished.  Cardiovascular: Normal rate and regular rhythm.   Pulmonary/Chest: Effort normal and breath sounds normal. No respiratory distress. He has no wheezes. He has no rales.  Abdominal:  Normal bowel sounds. No distention. He still has some residual tenderness left lower quadrant to deep palpation. No guarding or rebound. No masses.       Assessment:     Acute sigmoid diverticulitis. First episode. Slightly improved with antibiotics    Plan:     -Low residue diet for the next 1-2 weeks and then transition to a high-fiber diet -Finish out Flagyl and Cipro -Follow-up promptly for any increased abdominal pain or any recurrent fever -Recommend complete physical at follow-up and discuss colonoscopy screening and baseline PSA at that time given family history  Eulas Post MD [facility]

## 2015-12-17 NOTE — Patient Instructions (Signed)
Diverticulitis °Diverticulitis is inflammation or infection of small pouches in your colon that form when you have a condition called diverticulosis. The pouches in your colon are called diverticula. Your colon, or large intestine, is where water is absorbed and stool is formed. °Complications of diverticulitis can include: °· Bleeding. °· Severe infection. °· Severe pain. °· Perforation of your colon. °· Obstruction of your colon. °CAUSES  °Diverticulitis is caused by bacteria. °Diverticulitis happens when stool becomes trapped in diverticula. This allows bacteria to grow in the diverticula, which can lead to inflammation and infection. °RISK FACTORS °People with diverticulosis are at risk for diverticulitis. Eating a diet that does not include enough fiber from fruits and vegetables may make diverticulitis more likely to develop. °SYMPTOMS  °Symptoms of diverticulitis may include: °· Abdominal pain and tenderness. The pain is normally located on the left side of the abdomen, but may occur in other areas. °· Fever and chills. °· Bloating. °· Cramping. °· Nausea. °· Vomiting. °· Constipation. °· Diarrhea. °· Blood in your stool. °DIAGNOSIS  °Your health care provider will ask you about your medical history and do a physical exam. You may need to have tests done because many medical conditions can cause the same symptoms as diverticulitis. Tests may include: °· Blood tests. °· Urine tests. °· Imaging tests of the abdomen, including X-rays and CT scans. °When your condition is under control, your health care provider may recommend that you have a colonoscopy. A colonoscopy can show how severe your diverticula are and whether something else is causing your symptoms. °TREATMENT  °Most cases of diverticulitis are mild and can be treated at home. Treatment may include: °· Taking over-the-counter pain medicines. °· Following a clear liquid diet. °· Taking antibiotic medicines by mouth for 7-10 days. °More severe cases may  be treated at a hospital. Treatment may include: °· Not eating or drinking. °· Taking prescription pain medicine. °· Receiving antibiotic medicines through an IV tube. °· Receiving fluids and nutrition through an IV tube. °· Surgery. °HOME CARE INSTRUCTIONS  °· Follow your health care provider's instructions carefully. °· Follow a full liquid diet or other diet as directed by your health care provider. After your symptoms improve, your health care provider may tell you to change your diet. He or she may recommend you eat a high-fiber diet. Fruits and vegetables are good sources of fiber. Fiber makes it easier to pass stool. °· Take fiber supplements or probiotics as directed by your health care provider. °· Only take medicines as directed by your health care provider. °· Keep all your follow-up appointments. °SEEK MEDICAL CARE IF:  °· Your pain does not improve. °· You have a hard time eating food. °· Your bowel movements do not return to normal. °SEEK IMMEDIATE MEDICAL CARE IF:  °· Your pain becomes worse. °· Your symptoms do not get better. °· Your symptoms suddenly get worse. °· You have a fever. °· You have repeated vomiting. °· You have bloody or black, tarry stools. °MAKE SURE YOU:  °· Understand these instructions. °· Will watch your condition. °· Will get help right away if you are not doing well or get worse. °  °This information is not intended to replace advice given to you by your health care provider. Make sure you discuss any questions you have with your health care provider. °  °Document Released: 12/30/2004 Document Revised: 03/27/2013 Document Reviewed: 02/14/2013 °Elsevier Interactive Patient Education ©2016 [employer] ° °

## 2015-12-29 DIAGNOSIS — J209 Acute bronchitis, unspecified: Secondary | ICD-10-CM | POA: Diagnosis not present

## 2016-01-09 ENCOUNTER — Ambulatory Visit (INDEPENDENT_AMBULATORY_CARE_PROVIDER_SITE_OTHER): Payer: BLUE CROSS/BLUE SHIELD | Admitting: Family Medicine

## 2016-01-09 ENCOUNTER — Encounter: Payer: Self-pay | Admitting: Family Medicine

## 2016-01-09 VITALS — BP 126/76 | HR 72 | Temp 98.4°F | Wt 211.4 lb

## 2016-01-09 DIAGNOSIS — J181 Lobar pneumonia, unspecified organism: Secondary | ICD-10-CM

## 2016-01-09 DIAGNOSIS — J189 Pneumonia, unspecified organism: Secondary | ICD-10-CM

## 2016-01-09 MED ORDER — LEVOFLOXACIN 500 MG PO TABS: 500.0000 mg | ORAL_TABLET | Freq: Every day | ORAL | 0 refills | Status: DC

## 2016-01-09 MED ORDER — ALBUTEROL SULFATE HFA 108 (90 BASE) MCG/ACT IN AERS: 2.0000 | INHALATION_SPRAY | Freq: Four times a day (QID) | RESPIRATORY_TRACT | 0 refills | Status: DC | PRN

## 2016-01-09 MED ORDER — GUAIFENESIN-CODEINE 100-10 MG/5ML PO SOLN: 5.0000 mL | Freq: Four times a day (QID) | ORAL | 0 refills | Status: DC | PRN

## 2016-01-09 NOTE — Progress Notes (Signed)
Subjective:  Richard Carroll is a 45 y.o. year old very pleasant male patient who presents for/with See problem oriented charting ROS- no fever, chills, nausea, vomiting. Denies SOB other than with coughing fits.see any ROS included in HPI as well.   Past Medical History-  Patient Active Problem List   Diagnosis Date Noted  . Type 2 diabetes mellitus, controlled ([place]) 09/19/2014  . Acute sinus infection 07/09/2014  . Seborrheic dermatitis 06/16/2013  . Obesity (BMI 30-39.9) 12/16/2012  . Chronic eczema 12/15/2012  . Allergic rhinitis 12/18/2010  . Hyperlipidemia 08/18/2009    Medications- reviewed and updated Current Outpatient Prescriptions  Medication Sig Dispense Refill  . augmented betamethasone dipropionate (DIPROLENE) 0.05 % ointment Apply twice daily as needed no longer than 2 weeks of continuous use 30 g 3  . azelastine (ASTELIN) 137 MCG/SPRAY nasal spray Place 2 sprays into the nose 2 (two) times daily as needed. 30 mL 11  . B Complex-Biotin-FA (BIG 100) TABS Take by mouth. Takes 1 once per day    . glimepiride (AMARYL) 4 MG tablet Take 1 tablet (4 mg total) by mouth daily before breakfast. 90 tablet 2  . glucose blood (ACCU-CHEK AVIVA) test strip 1 each by Other route as directed. Use as instructed     . metFORMIN (GLUCOPHAGE) 500 MG tablet TAKE 2 TABLETS BY MOUTH TWICE A DAY WITH MEALS 360 tablet 2  . simvastatin (ZOCOR) 80 MG tablet Take 0.5 tablets (40 mg total) by mouth daily. 90 tablet 2  . triamcinolone (NASACORT AQ) 55 MCG/ACT AERO nasal inhaler Place 2 sprays into the nose daily. 1 Inhaler 12  . triamcinolone cream (KENALOG) 0.1 % Apply 1 application topically 2 (two) times daily. 30 g 3   Objective: BP 126/76 (BP Location: Left Arm, Patient Position: Sitting, Cuff Size: Large)   Pulse 72   Temp 98.4 F (36.9 C) (Oral)   Wt 211 lb 6.4 oz (95.9 kg)   SpO2 96%   BMI 30.33 kg/m  Gen: NAD, does appear fatigued CV: RRR no murmurs rubs or gallops HEENT: mild  rhinorrhea clear, nares normal, TM normal Lungs: LLL with crackles. Otherwise CTAB no crackles, wheeze, rhonchi  Ext: no edema Skin: warm, dry, no rash  Assessment/Plan:  CAP S: seen in UC 2 weeks ago and treated with antibiotics (azithromycin) and tessalon for bronchitis. Had been sick for a few days before that so sick now for about 3 weeks. Complains of severe cough, with wheeze at times. Not improving despite any of above treatments. Also has done delsym with little help.  A/P: New crackles in LLL in patient with prior bronchitis with likely development of community acquired pneumonia. GIven prior antibiotic use- considered augmentin plus doxycycline vs. Levaquin. Patient works multiple jobs and for ease of regimen and to help with compliance- opted for once daily levaquin dosing. Took him out of work for next 2 days. Strict return precautions discussed. Given codeine cough syrup. Albuterol if wheezing but none heard on exam today and he cannot pick this up if costly.   Before exam we had discussed possible treatment with steroid but a1c was 8.9 previously so we opted against and with new PNA opted to focus on antibacterial coverage. Given crackles on exam, gave option of x-ray or not- patient opted out at this time but agrees if persistent symptoms despite treatment to consider  Meds ordered this encounter  Medications  . levofloxacin (LEVAQUIN) 500 MG tablet    Sig: Take 1 tablet (500 mg  total) by mouth daily.    Dispense:  7 tablet    Refill:  0  . guaiFENesin-codeine 100-10 MG/5ML syrup    Sig: Take 5 mLs by mouth every 6 (six) hours as needed for cough.    Dispense:  120 mL    Refill:  0  . albuterol (PROVENTIL HFA;VENTOLIN HFA) 108 (90 Base) MCG/ACT inhaler    Sig: Inhale 2 puffs into the lungs every 6 (six) hours as needed for wheezing or shortness of breath.    Dispense:  1 Inhaler    Refill:  0   Garret Reddish, MD

## 2016-01-09 NOTE — Patient Instructions (Signed)
Appears new pneumonia in left lower lobe. Treat with antibiotics for 7 days. If not improving or worsens or fever especially after 48 hours on antibiotics return to care.   Meds ordered this encounter  Medications  . levofloxacin (LEVAQUIN) 500 MG tablet- antibiotic    Sig: Take 1 tablet (500 mg total) by mouth daily.    Dispense:  7 tablet    Refill:  0  . guaiFENesin-codeine 100-10 MG/5ML syrup- cough medicine. Do not drive for 8 hours after taking    Sig: Take 5 mLs by mouth every 6 (six) hours as needed for cough.    Dispense:  120 mL    Refill:  0  . albuterol (PROVENTIL HFA;VENTOLIN HFA) 108 (90 Base) MCG/ACT inhaler- only pick up if affordable    Sig: Inhale 2 puffs into the lungs every 6 (six) hours as needed for wheezing or shortness of breath.    Dispense:  1 Inhaler    Refill:  0

## 2016-01-09 NOTE — Progress Notes (Signed)
Pre visit review using our clinic review tool, if applicable. No additional management support is needed unless otherwise documented below in the visit note. 

## 2016-01-12 ENCOUNTER — Encounter: Payer: Self-pay | Admitting: Family Medicine

## 2016-01-12 ENCOUNTER — Ambulatory Visit (INDEPENDENT_AMBULATORY_CARE_PROVIDER_SITE_OTHER): Payer: BLUE CROSS/BLUE SHIELD | Admitting: Family Medicine

## 2016-01-12 VITALS — BP 122/86 | HR 96 | Temp 98.1°F | Ht 70.0 in | Wt 209.4 lb

## 2016-01-12 DIAGNOSIS — R05 Cough: Secondary | ICD-10-CM

## 2016-01-12 DIAGNOSIS — R059 Cough, unspecified: Secondary | ICD-10-CM

## 2016-01-12 NOTE — Patient Instructions (Signed)
Go and get CXR at [facility]  Start the Albuterol inhaler Follow up immediately for any increased shortness of breath.

## 2016-01-12 NOTE — Progress Notes (Signed)
Subjective:     Patient ID: Richard Carroll, male   DOB: 01-Dec-1970, 45 y.o.   MRN: [id number]  HPI Patient is seen with persistent coughing. Refer to recent note. He went to an urgent care around September 28 with cough and reportedly had chest x-ray which was unremarkable. He was diagnosed with "bronchitis "and started on Zithromax and Tessalon [place]. Was seen here last week and noted to have rales left lower lobe. Prescribed codeine cough syrup and Levaquin. Presumptive diagnosis of community-acquired pneumonia. Patient has not had any definitive fever but has had some chills. Cough persists today unchanged. No vomiting. No dyspnea. No hemoptysis. Patient is nonsmoker. He was prescribed albuterol but never got this prescription filled.  Past Medical History:  Diagnosis Date  . Arthritis   . Diabetes mellitus type II   . DIABETES MELLITUS, TYPE II 08/18/2009  . Hay fever    with allergies  . Hyperlipidemia   . HYPERLIPIDEMIA 08/18/2009   Past Surgical History:  Procedure Laterality Date  . KNEE SURGERY  2005   r. knee    reports that he has never smoked. His smokeless tobacco use includes Chew. He reports that he drinks alcohol. He reports that he does not use drugs. family history includes Alcohol abuse in his maternal grandfather; Diabetes in his maternal grandmother and mother; Heart disease in his father; Hyperlipidemia in his mother; Hypertension in his mother; Prostate cancer in his father; Stroke in his mother. No Known Allergies   Review of Systems  Constitutional: Positive for fatigue. Negative for fever.  HENT: Negative for congestion and sore throat.   Respiratory: Positive for cough.   Cardiovascular: Negative for chest pain, palpitations and leg swelling.       Objective:   Physical Exam  Constitutional: He appears well-developed and well-nourished. No distress.  Neck: Neck supple.  Cardiovascular: Normal rate and regular rhythm.   Pulmonary/Chest: Effort normal  and breath sounds normal. No respiratory distress.  He has a few faint wheezes scattered throughout but no respiratory distress. Normal respiratory rate. A few faint crackles noted left base  Musculoskeletal: He exhibits no edema.  Lymphadenopathy:    He has no cervical adenopathy.       Assessment:     Persistent cough with possible left lower lobe community-acquired pneumonia. He has mild reactive airway component.   No respiratory distress.    Plan:     -Go ahead and start albuterol as needed -Continue Levaquin -Chest x-ray ordered to further assess -Extend out of work for next couple days with tentative return on 01/15/2016 -May need to consider brief burst of prednisone but try to avoid with his diabetes  Eulas Post MD [facility]

## 2016-01-12 NOTE — Progress Notes (Signed)
Pre visit review using our clinic review tool, if applicable. No additional management support is needed unless otherwise documented below in the visit note. 

## 2016-01-13 ENCOUNTER — Ambulatory Visit (INDEPENDENT_AMBULATORY_CARE_PROVIDER_SITE_OTHER)
Admission: RE | Admit: 2016-01-13 | Discharge: 2016-01-13 | Disposition: A | Discharge status: Home / Self Care | Payer: BLUE CROSS/BLUE SHIELD | Source: Ambulatory Visit | Attending: Family Medicine | Admitting: Family Medicine

## 2016-01-13 ENCOUNTER — Other Ambulatory Visit: Payer: Self-pay

## 2016-01-13 ENCOUNTER — Telehealth: Payer: Self-pay | Admitting: Family Medicine

## 2016-01-13 DIAGNOSIS — R05 Cough: Secondary | ICD-10-CM

## 2016-01-13 DIAGNOSIS — R059 Cough, unspecified: Secondary | ICD-10-CM

## 2016-01-13 MED ORDER — LEVOFLOXACIN 500 MG PO TABS: 500.0000 mg | ORAL_TABLET | Freq: Every day | ORAL | 0 refills | Status: DC

## 2016-01-13 NOTE — Telephone Encounter (Signed)
Will call pt when results are annotated.

## 2016-01-13 NOTE — Telephone Encounter (Signed)
Pt would like results of xray done this am.

## 2016-01-14 NOTE — Telephone Encounter (Signed)
Work note has been faxed and he is aware of his results.

## 2016-01-14 NOTE — Telephone Encounter (Signed)
Pt would like a call back to inquire about additional time out of work due to PNA results.

## 2016-01-19 ENCOUNTER — Encounter: Payer: Self-pay | Admitting: Family Medicine

## 2016-01-19 ENCOUNTER — Ambulatory Visit (INDEPENDENT_AMBULATORY_CARE_PROVIDER_SITE_OTHER): Payer: BLUE CROSS/BLUE SHIELD | Admitting: Family Medicine

## 2016-01-19 VITALS — BP 120/82 | HR 120 | Temp 98.7°F | Ht 70.0 in | Wt 213.5 lb

## 2016-01-19 DIAGNOSIS — R059 Cough, unspecified: Secondary | ICD-10-CM

## 2016-01-19 DIAGNOSIS — R05 Cough: Secondary | ICD-10-CM | POA: Diagnosis not present

## 2016-01-19 DIAGNOSIS — R062 Wheezing: Secondary | ICD-10-CM | POA: Diagnosis not present

## 2016-01-19 DIAGNOSIS — Z23 Encounter for immunization: Secondary | ICD-10-CM

## 2016-01-19 MED ORDER — HYDROCODONE-HOMATROPINE 5-1.5 MG/5ML PO SYRP: 5.0000 mL | ORAL_SOLUTION | Freq: Four times a day (QID) | ORAL | 0 refills | Status: AC | PRN

## 2016-01-19 MED ORDER — METHYLPREDNISOLONE ACETATE 80 MG/ML IJ SUSP
80.0000 mg | Freq: Once | INTRAMUSCULAR | Status: AC
  Administered 2016-01-19: 80 mg via INTRAMUSCULAR

## 2016-01-19 NOTE — Addendum Note (Signed)
Addended by: Elio Forget on: 01/19/2016 02:00 PM   Modules accepted: Orders

## 2016-01-19 NOTE — Patient Instructions (Signed)
Follow up for any fever or increased shortness of breath. 

## 2016-01-19 NOTE — Progress Notes (Signed)
Subjective:     Patient ID: Richard Carroll, male   DOB: April 29, 1970, 45 y.o.   MRN: [id number]  HPI Patient seen with persistent cough. Refer to recent notes. He initially went to urgent care center was placed on Zithromax and chest x-ray reportedly normal. He was subsequently seen here noted to have some rales in the left base and started on Levaquin. He has finished full course of Levaquin. Follow-up chest x-ray on 10/10 showed question of some mild residual atelectasis versus residual infiltrate. Overall, he feels somewhat improved. No fever. No chills. No nausea or vomiting. He does have explosive coughing still at times along with some intermittent wheezing. He's been using albuterol some relief. Also at night taking Hycodan cough syrup which seemed to help.  Nonsmoker. No history of asthma  He has been out of work now for almost 2 weeks. 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 Arthritis   . Diabetes mellitus type II   . DIABETES MELLITUS, TYPE II 08/18/2009  . Hay fever    with allergies  . Hyperlipidemia   . HYPERLIPIDEMIA 08/18/2009   Past Surgical History:  Procedure Laterality Date  . KNEE SURGERY  2005   r. knee    reports that he has never smoked. His smokeless tobacco use includes Chew. He reports that he drinks alcohol. He reports that he does not use drugs. family history includes Alcohol abuse in his maternal grandfather; Diabetes in his maternal grandmother and mother; Heart disease in his father; Hyperlipidemia in his mother; Hypertension in his mother; Prostate cancer in his father; Stroke in his mother. No Known Allergies   Review of Systems  Constitutional: Negative for chills and fever.  HENT: Negative for congestion.   Respiratory: Positive for cough and wheezing.   Cardiovascular: Negative for chest pain.       Objective:   Physical Exam  Constitutional: He appears  well-developed and well-nourished.  HENT:  Mouth/Throat: Oropharynx is clear and moist.  Neck: Neck supple.  Cardiovascular: Normal rate and regular rhythm.   Pulmonary/Chest: Effort normal. He has no rales.  He has a few expiratory wheezes but no rest or distress. Normal respiratory rate.  Lymphadenopathy:    He has no cervical adenopathy.       Assessment:     Cough with mild reactive airway component. Probable recent community-acquired left lower lobe pneumonia    Plan:     -Work noted extended through 01/21/2016 -Depo-Medrol 80 mg IM given and monitor blood sugars closely -Continue albuterol as needed -Refill Hycodan cough syrup 1 teaspoon every 6 hours for severe cough as needed (120 ml) -No indication for further antibiotics at this time and follow-up immediately for any fever or increasing shortness of breath  Eulas Post MD [facility] at [facility]

## 2016-01-19 NOTE — Progress Notes (Signed)
Pre visit review using our clinic review tool, if applicable. No additional management support is needed unless otherwise documented below in the visit note. 

## 2016-02-05 ENCOUNTER — Telehealth: Payer: Self-pay | Admitting: Family Medicine

## 2016-02-05 MED ORDER — AZELASTINE HCL 0.1 % NA SOLN: 2.0000 | Freq: Two times a day (BID) | NASAL | 1 refills | Status: DC | PRN

## 2016-02-05 NOTE — Telephone Encounter (Signed)
° ° °  Pt request refill of the following:   azelastine (ASTELIN) 137 MCG/SPRAY nasal spray   Phamacy:   [facility] [address]

## 2016-02-05 NOTE — Telephone Encounter (Signed)
Rx done. 

## 2016-03-04 ENCOUNTER — Other Ambulatory Visit: Payer: Self-pay

## 2016-03-15 ENCOUNTER — Other Ambulatory Visit: Payer: Self-pay

## 2016-05-11 ENCOUNTER — Ambulatory Visit: Payer: Self-pay | Admitting: Family Medicine

## 2016-12-23 ENCOUNTER — Encounter: Payer: Self-pay | Admitting: Family Medicine

## 2017-04-17 IMAGING — CT CT ABD-PELV W/ CM
2 of 5 series · 16 of 46 positions shown, 18 images · IV contrast (ISOVUE)
Comparison: None.

CLINICAL DATA: Sharp left lower quadrant abdominal pain since last
night. Flank pain.

EXAM:
CT ABDOMEN AND PELVIS WITH CONTRAST
TECHNIQUE: Multidetector CT imaging of the abdomen and pelvis was performed
using the standard protocol following bolus administration of
intravenous contrast.
CONTRAST:  15mL BKB4K7-2EE IOPAMIDOL (BKB4K7-2EE) INJECTION 61%,
100mL BKB4K7-2EE IOPAMIDOL (BKB4K7-2EE) INJECTION 61%

[Series 2: abd/pel with · axial · 0.93mm/px · z∈[-429,+16]mm · 13 of 101 slices shown, 15 images]
[im 6/101  soft-tissue]
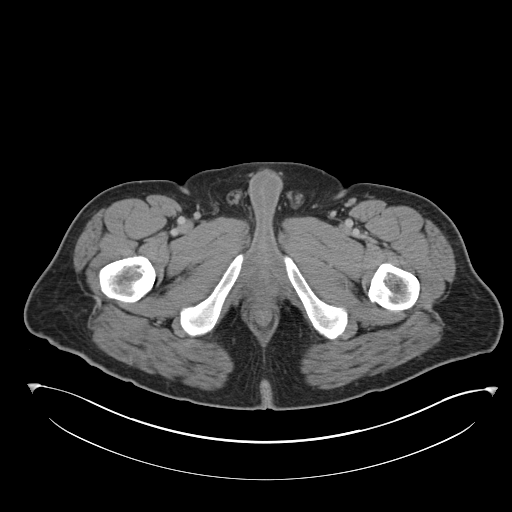
[im 6/101  bone]
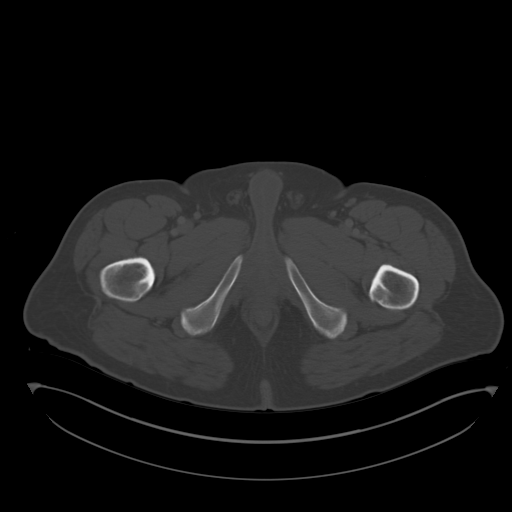
[im 12/101  soft-tissue]
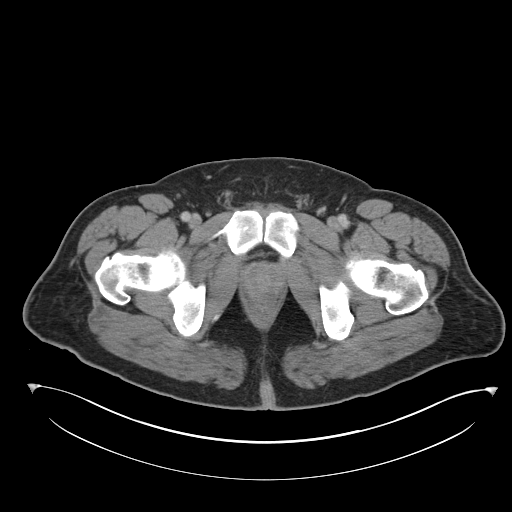
[im 23/101  soft-tissue]
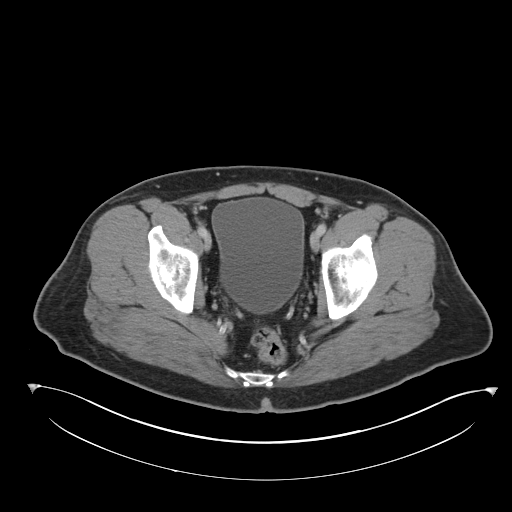
[im 28/101  soft-tissue]
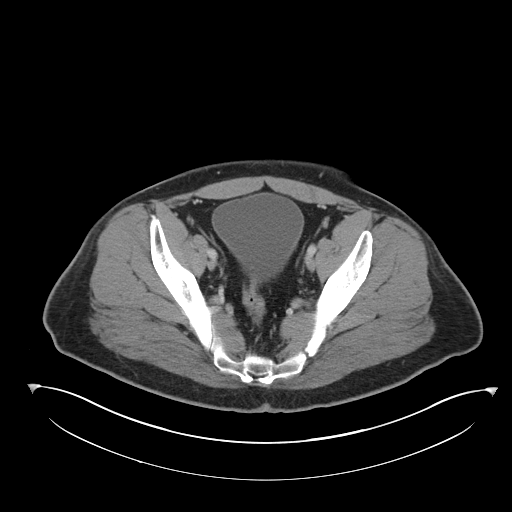
[im 34/101  soft-tissue]
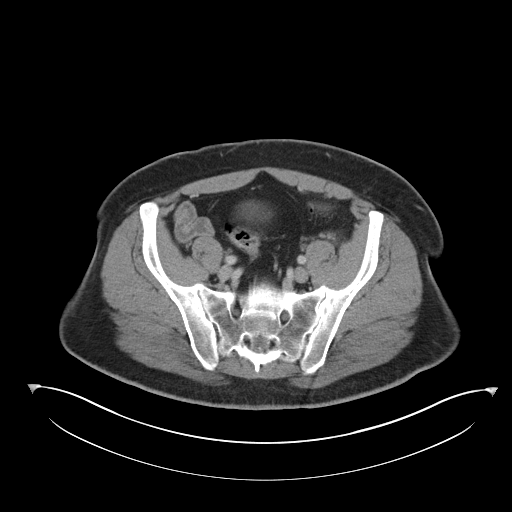
[im 45/101  soft-tissue]
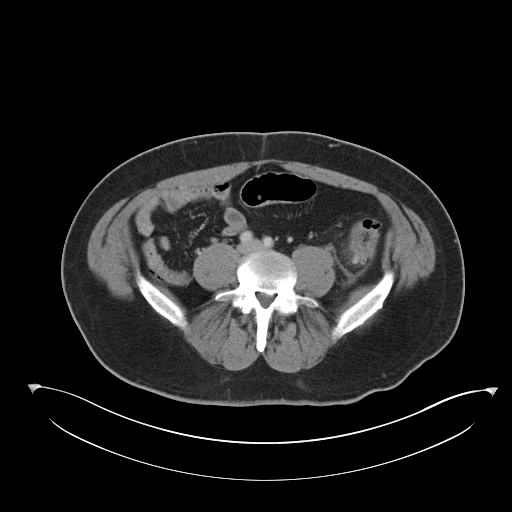
[im 51/101  soft-tissue]
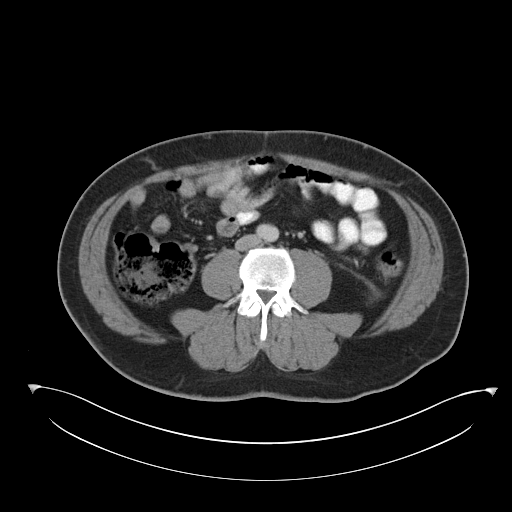
[im 56/101  soft-tissue]
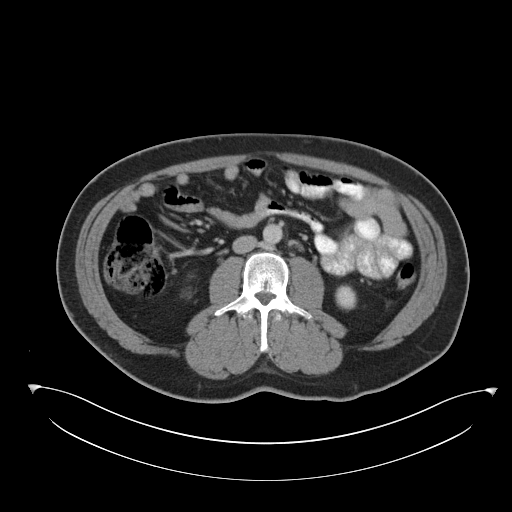
[im 67/101  soft-tissue]
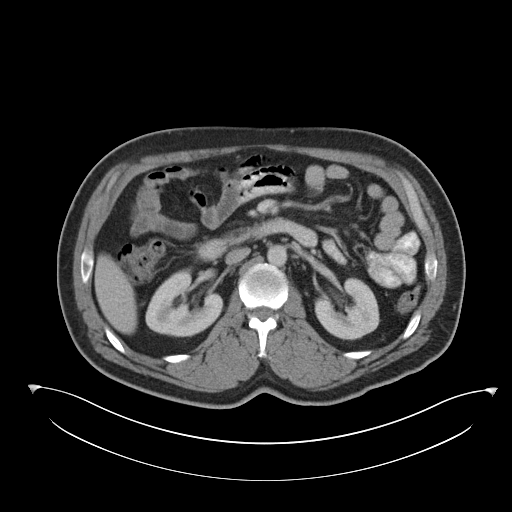
[im 67/101  bone]
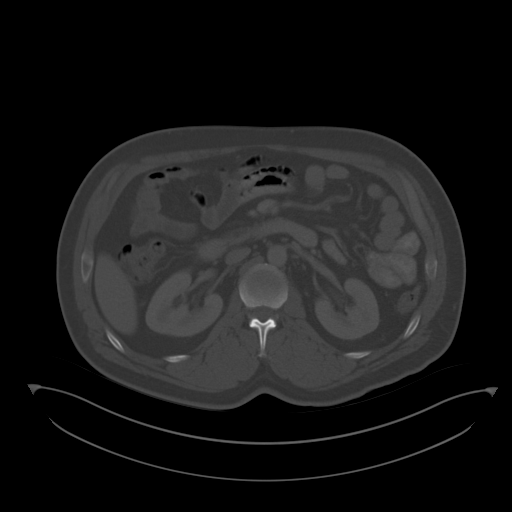
[im 73/101  soft-tissue]
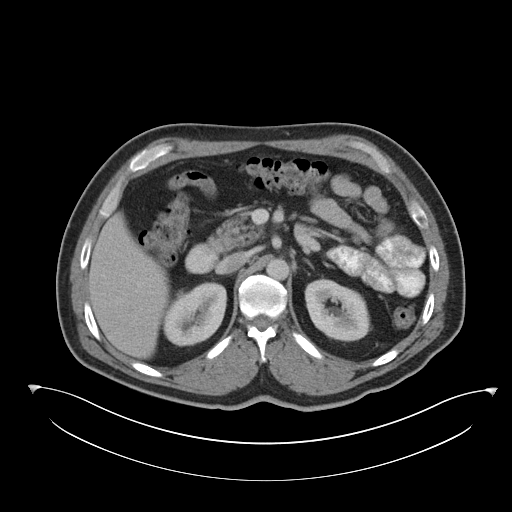
[im 78/101  soft-tissue]
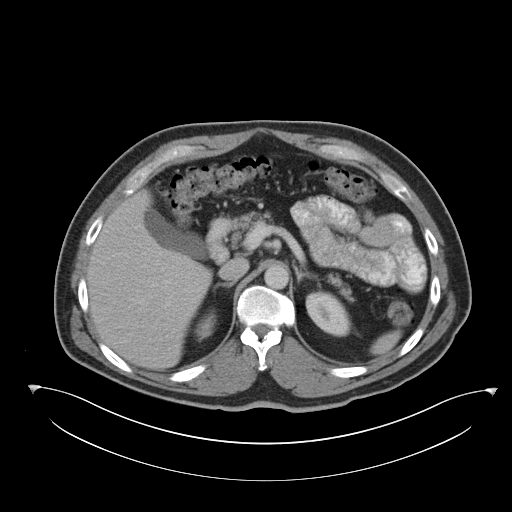
[im 89/101  soft-tissue]
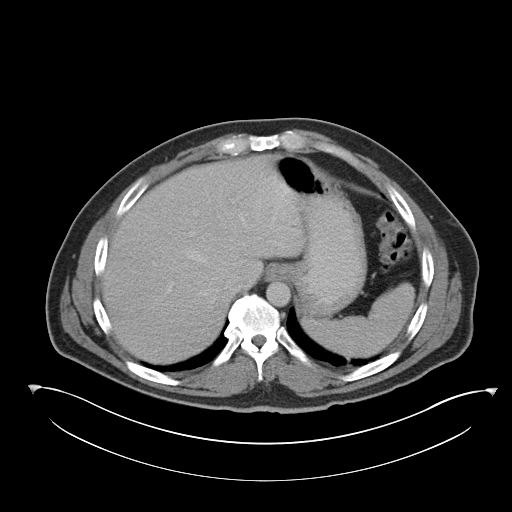
[im 95/101  soft-tissue]
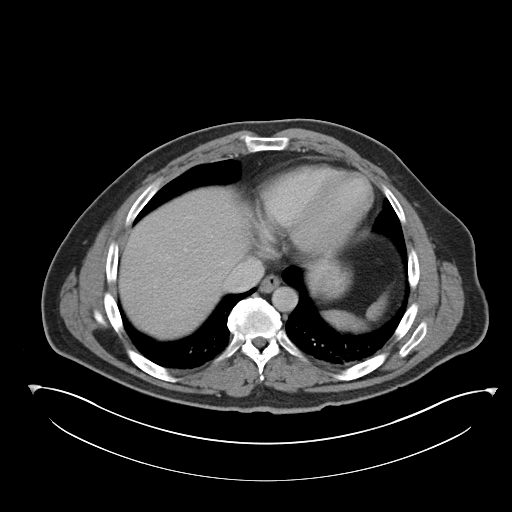

[Series 4: coronal a/|p · coronal · 0.74mm/px · 3 of 126 slices shown]
[im 42/126  soft-tissue]
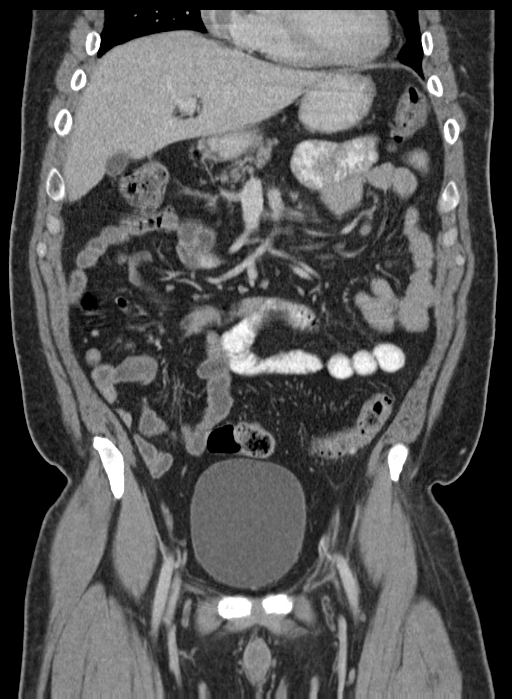
[im 56/126  soft-tissue]
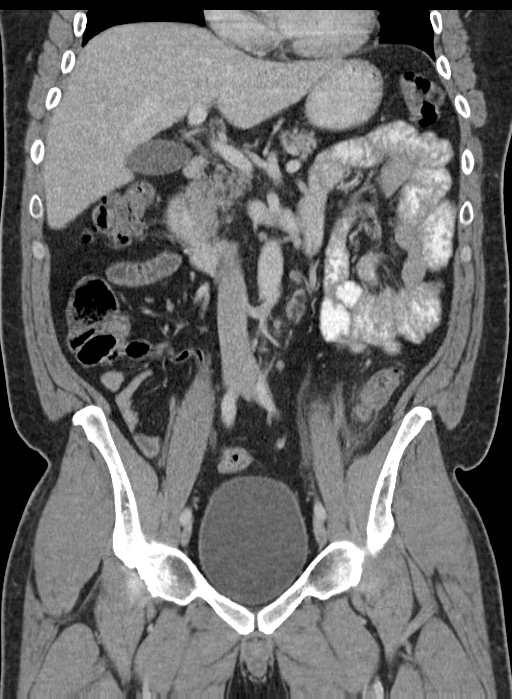
[im 70/126  soft-tissue]
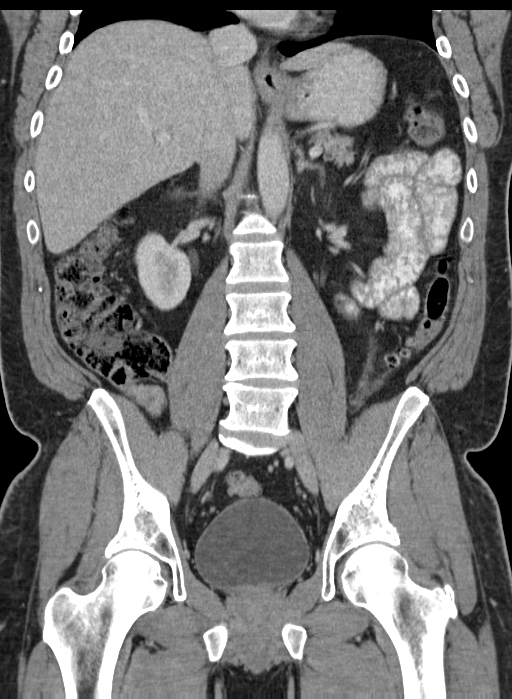

[16 of 46 positions shown; findings below may reference images not displayed]

FINDINGS: Lower chest: Minimal bilateral dependent atelectasis.

Hepatobiliary: No focal liver abnormality is seen. No gallstones,
gallbladder wall thickening, or biliary dilatation.

Pancreas: Unremarkable. No pancreatic ductal dilatation or
surrounding inflammatory changes.

Spleen: Normal in size without focal abnormality.

Adrenals/Urinary Tract: Adrenal glands are unremarkable. Small upper
pole right renal cyst. Otherwise, the kidneys are normal, without
renal calculi, focal lesion, or hydronephrosis. Bladder is
unremarkable.

Stomach/Bowel: Multiple diverticula in the proximal sigmoid and
distal descending colon. There is ill-defined adjacent soft tissue
stranding at the level of the proximal sigmoid colon with adjacent
fascial thickening. No fluid collections or free peritoneal air are
seen.

Normal appearing appendix, stomach and small bowel.

Vascular/Lymphatic: No significant vascular findings are present. No
enlarged abdominal or pelvic lymph nodes.

Reproductive: Prostate is unremarkable.

Other: Tiny umbilical hernia containing fat.

Musculoskeletal: Mild lumbar and moderate lower thoracic spine
degenerative changes. Left femoral head and right femoral neck bone
islands.
IMPRESSION: 1. Proximal sigmoid diverticulitis without abscess.
2. Sigmoid and descending colon diverticulosis.

## 2017-06-24 ENCOUNTER — Ambulatory Visit: Payer: Self-pay | Admitting: Family Medicine

## 2017-07-05 ENCOUNTER — Ambulatory Visit (INDEPENDENT_AMBULATORY_CARE_PROVIDER_SITE_OTHER): Payer: BLUE CROSS/BLUE SHIELD | Admitting: Family Medicine

## 2017-07-05 ENCOUNTER — Encounter: Payer: Self-pay | Admitting: Family Medicine

## 2017-07-05 VITALS — BP 110/70 | HR 102 | Temp 98.8°F | Wt 192.6 lb

## 2017-07-05 DIAGNOSIS — E7849 Other hyperlipidemia: Secondary | ICD-10-CM | POA: Diagnosis not present

## 2017-07-05 DIAGNOSIS — E1165 Type 2 diabetes mellitus with hyperglycemia: Secondary | ICD-10-CM

## 2017-07-05 DIAGNOSIS — R634 Abnormal weight loss: Secondary | ICD-10-CM | POA: Diagnosis not present

## 2017-07-05 LAB — LIPID PANEL
Cholesterol: 228 mg/dL — ABNORMAL HIGH (ref 0–200)
HDL: 45.3 mg/dL
NonHDL: 182.4
Total CHOL/HDL Ratio: 5
Triglycerides: 255 mg/dL — ABNORMAL HIGH (ref 0.0–149.0)
VLDL: 51 mg/dL — ABNORMAL HIGH (ref 0.0–40.0)

## 2017-07-05 LAB — BASIC METABOLIC PANEL
BUN: 14 mg/dL (ref 6–23)
CALCIUM: 9 mg/dL (ref 8.4–10.5)
CO2: 31 meq/L (ref 19–32)
Chloride: 97 mEq/L (ref 96–112)
Creatinine, Ser: 0.89 mg/dL (ref 0.40–1.50)
GFR: 98.08 mL/min (ref >60.00)
GLUCOSE: 493 mg/dL — AB (ref 70–99)
POTASSIUM: 4.7 meq/L (ref 3.5–5.1)
SODIUM: 134 meq/L — AB (ref 135–145)

## 2017-07-05 LAB — HEPATIC FUNCTION PANEL
ALT: 19 U/L (ref 0–53)
AST: 14 U/L (ref 0–37)
Albumin: 3.8 g/dL (ref 3.5–5.2)
Alkaline Phosphatase: 76 U/L (ref 39–117)
BILIRUBIN DIRECT: 0.1 mg/dL (ref 0.0–0.3)
Total Bilirubin: 0.5 mg/dL (ref 0.2–1.2)
Total Protein: 6.2 g/dL (ref 6.0–8.3)

## 2017-07-05 LAB — POCT GLYCOSYLATED HEMOGLOBIN (HGB A1C): Hemoglobin A1C: 12.6

## 2017-07-05 LAB — LDL CHOLESTEROL, DIRECT: LDL DIRECT: 165 mg/dL

## 2017-07-05 LAB — MICROALBUMIN / CREATININE URINE RATIO
CREATININE, U: 47.9 mg/dL
Microalb Creat Ratio: 1.5 mg/g (ref 0.0–30.0)

## 2017-07-05 MED ORDER — GLIMEPIRIDE 4 MG PO TABS: 4.0000 mg | ORAL_TABLET | Freq: Every day | ORAL | 5 refills | Status: DC

## 2017-07-05 MED ORDER — SIMVASTATIN 80 MG PO TABS: 40.0000 mg | ORAL_TABLET | Freq: Every day | ORAL | 5 refills | Status: DC

## 2017-07-05 MED ORDER — METFORMIN HCL 500 MG PO TABS: ORAL_TABLET | ORAL | 5 refills | Status: DC

## 2017-07-05 NOTE — Progress Notes (Signed)
Subjective:     Patient ID: Richard Carroll, male   DOB: May 31, 1970, 47 y.o.   MRN: [id number]  HPI Patient seen for medical follow-up. He was lost to follow-up because of lack of insurance. He now has coverage again and plans to get back on board with regular follow-up. He basically has not taken any of his regular medications for over a year. He has type 2 diabetes. Had been fairly well controlled on glimepiride and metformin. He has occasional polyuria. Recent blood sugars been over 300. Also had been taking simvastatin for hyperlipidemia but again is been off all medicines for almost 2 years.  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 He has a rescheduled follow-up eye exam. No neuropathy symptoms. No recent chest pains.  Past Medical History:  Diagnosis Date  . Arthritis   . Diabetes mellitus type II   . DIABETES MELLITUS, TYPE II 08/18/2009  . Hay fever    with allergies  . Hyperlipidemia   . HYPERLIPIDEMIA 08/18/2009   Past Surgical History:  Procedure Laterality Date  . KNEE SURGERY  2005   r. knee    reports that he has never smoked. His smokeless tobacco use includes chew. He reports that he drinks alcohol. He reports that he does not use drugs. family history includes Alcohol abuse in his maternal grandfather; Diabetes in his maternal grandmother and mother; Heart disease in his father; Hyperlipidemia in his mother; Hypertension in his mother; Prostate cancer in his father; Stroke in his mother. No Known Allergies   Review of Systems  Constitutional: Negative for fatigue.  Eyes: Negative for visual disturbance.  Respiratory: Negative for cough, chest tightness and shortness of breath.   Cardiovascular: Negative for chest pain, palpitations and leg swelling.  Gastrointestinal: Negative for abdominal pain.  Endocrine: Negative for polydipsia.  Genitourinary:  Negative for dysuria.  Neurological: Negative for dizziness, syncope, weakness, light-headedness and headaches.       Objective:   Physical Exam  Constitutional: He is oriented to person, place, and time. He appears well-developed and well-nourished.  Neck: Neck supple. No thyromegaly present.  Cardiovascular: Normal rate and regular rhythm.  Pulmonary/Chest: Effort normal and breath sounds normal. No respiratory distress. He has no wheezes. He has no rales.  Musculoskeletal: He exhibits no edema.  Lymphadenopathy:    He has no cervical adenopathy.  Neurological: He is alert and oriented to person, place, and time.       Assessment:     #1 weight loss probably related to poorly controlled diabetes  #2 type 2 diabetes with very poor control secondary to recent poor compliance with medications. A1c today 12.6%  #3 dyslipidemia    Plan:     -Check further labs with basic metabolic panel, lipid panel, urine micro-albumin -Get back on his regular medications. We've advised that he start metformin 500 mg twice daily for a week and then titrate up to 500 mg 2 twice daily -Monitor glucose closely over the next few weeks -Refill simvastatin -Follow-up here in one month to reassess  Eulas Post MD [facility] at [facility]

## 2017-07-05 NOTE — Patient Instructions (Signed)
Get back on your regular medications Let's plan on one month follow up.

## 2017-07-15 DIAGNOSIS — E119 Type 2 diabetes mellitus without complications: Secondary | ICD-10-CM | POA: Diagnosis not present

## 2017-07-15 LAB — HM DIABETES EYE EXAM

## 2017-07-20 ENCOUNTER — Encounter: Payer: Self-pay | Admitting: Family Medicine

## 2017-08-05 ENCOUNTER — Encounter: Payer: Self-pay | Admitting: Family Medicine

## 2017-08-05 ENCOUNTER — Ambulatory Visit (INDEPENDENT_AMBULATORY_CARE_PROVIDER_SITE_OTHER): Payer: BLUE CROSS/BLUE SHIELD | Admitting: Family Medicine

## 2017-08-05 VITALS — BP 120/80 | HR 96 | Temp 98.6°F | Wt 198.4 lb

## 2017-08-05 DIAGNOSIS — E1165 Type 2 diabetes mellitus with hyperglycemia: Secondary | ICD-10-CM | POA: Diagnosis not present

## 2017-08-05 NOTE — Progress Notes (Signed)
  Subjective:     Patient ID: Richard Carroll, male   DOB: Jan 05, 1971, 47 y.o.   MRN: [id number]  HPI  Patient here for medical follow-up. He had lost his insurance until recently. Came back with very poorly controlled sugars recently with A1c 12.6%. We started back metformin and glimepiride. His fasting blood sugars are now mostly low 100s with a range of 114 to 174. No polyuria. No polydipsia. Feels better overall. Weight is up 6 pounds. He had elevated lipids and is now back on simvastatin.  Past Medical History:  Diagnosis Date  . Arthritis   . Diabetes mellitus type II   . DIABETES MELLITUS, TYPE II 08/18/2009  . Hay fever    with allergies  . Hyperlipidemia   . HYPERLIPIDEMIA 08/18/2009   Past Surgical History:  Procedure Laterality Date  . KNEE SURGERY  2005   r. knee    reports that he has never smoked. His smokeless tobacco use includes chew. He reports that he drinks alcohol. He reports that he does not use drugs. family history includes Alcohol abuse in his maternal grandfather; Diabetes in his maternal grandmother and mother; Heart disease in his father; Hyperlipidemia in his mother; Hypertension in his mother; Prostate cancer in his father; Stroke in his mother. No Known Allergies  Review of Systems  Constitutional: Negative for fatigue.  Eyes: Negative for visual disturbance.  Respiratory: Negative for cough, chest tightness and shortness of breath.   Cardiovascular: Negative for chest pain, palpitations and leg swelling.  Endocrine: Negative for polydipsia and polyuria.  Neurological: Negative for dizziness, syncope, weakness, light-headedness and headaches.       Objective:   Physical Exam  Constitutional: He is oriented to person, place, and time. He appears well-developed and well-nourished.  HENT:  Right Ear: External ear normal.  Left Ear: External ear normal.  Mouth/Throat: Oropharynx is clear and moist.  Eyes: Pupils are equal, round, and reactive to  light.  Neck: Neck supple. No thyromegaly present.  Cardiovascular: Normal rate and regular rhythm.  Pulmonary/Chest: Effort normal and breath sounds normal. No respiratory distress. He has no wheezes. He has no rales.  Musculoskeletal: He exhibits no edema.  Neurological: He is alert and oriented to person, place, and time.       Assessment:     Type 2 diabetes improving dramatically by home readings.    Plan:     -We'll plan follow-up in 2 months and get follow-up lipids and along with hemoglobin A1c  Eulas Post MD [facility] at [facility]

## 2017-10-07 ENCOUNTER — Encounter: Payer: Self-pay | Admitting: Family Medicine

## 2017-10-07 ENCOUNTER — Ambulatory Visit (INDEPENDENT_AMBULATORY_CARE_PROVIDER_SITE_OTHER): Payer: BLUE CROSS/BLUE SHIELD | Admitting: Family Medicine

## 2017-10-07 VITALS — BP 104/64 | Temp 98.4°F | Wt 205.0 lb

## 2017-10-07 DIAGNOSIS — E1165 Type 2 diabetes mellitus with hyperglycemia: Secondary | ICD-10-CM | POA: Diagnosis not present

## 2017-10-07 DIAGNOSIS — E785 Hyperlipidemia, unspecified: Secondary | ICD-10-CM

## 2017-10-07 LAB — GLUCOSE, POCT (MANUAL RESULT ENTRY): POC GLUCOSE: 121 mg/dL — AB (ref 70–99)

## 2017-10-07 LAB — POCT GLYCOSYLATED HEMOGLOBIN (HGB A1C): HbA1c, POC (controlled diabetic range): 7 % (ref 0.0–7.0)

## 2017-10-07 NOTE — Patient Instructions (Signed)
blood sugar greatly improved.    Continue current medications.

## 2017-10-07 NOTE — Progress Notes (Signed)
  Subjective:     Patient ID: Richard Carroll, male   DOB: 15-Nov-1970, 47 y.o.   MRN: [id number]  HPI Follow-up regarding diabetes and hyperlipidemia. Refer to previous note. He basically had run out of all his medications. He came in with A1c of 12.6% and some weight loss. He has gained back some weight since last visit after starting back diabetes medications. He did not feel particularly poor then. He denies any polyuria or polydipsia at this time. Not monitoring sugars regularly. He is back on metformin, Amaryl, and simvastatin.  Recent lipids cholesterol 228, triglycerides 255, LDL 165 when he was off statin. Denies any recent chest pain.  Past Medical History:  Diagnosis Date  . Arthritis   . Diabetes mellitus type II   . DIABETES MELLITUS, TYPE II 08/18/2009  . Hay fever    with allergies  . Hyperlipidemia   . HYPERLIPIDEMIA 08/18/2009   Past Surgical History:  Procedure Laterality Date  . KNEE SURGERY  2005   r. knee    reports that he has never smoked. His smokeless tobacco use includes chew. He reports that he drinks alcohol. He reports that he does not use drugs. family history includes Alcohol abuse in his maternal grandfather; Diabetes in his maternal grandmother and mother; Heart disease in his father; Hyperlipidemia in his mother; Hypertension in his mother; Prostate cancer in his father; Stroke in his mother. No Known Allergies   Review of Systems  Constitutional: Negative for fatigue and unexpected weight change.  Eyes: Negative for visual disturbance.  Respiratory: Negative for cough, chest tightness and shortness of breath.   Cardiovascular: Negative for chest pain, palpitations and leg swelling.  Endocrine: Negative for polydipsia and polyuria.  Neurological: Negative for dizziness, syncope, weakness, light-headedness and headaches.       Objective:   Physical Exam  Constitutional: He is oriented to person, place, and time. He appears well-developed and  well-nourished.  HENT:  Right Ear: External ear normal.  Left Ear: External ear normal.  Mouth/Throat: Oropharynx is clear and moist.  Eyes: Pupils are equal, round, and reactive to light.  Neck: Neck supple. No thyromegaly present.  Cardiovascular: Normal rate and regular rhythm.  Pulmonary/Chest: Effort normal and breath sounds normal. No respiratory distress. He has no wheezes. He has no rales.  Musculoskeletal: He exhibits no edema.  Neurological: He is alert and oriented to person, place, and time.       Assessment:     #1 type 2 diabetes. Greatly improved with A1c today 7.0%  #2 dyslipidemia with recent LDL165 off statin medication    Plan:     -Continue current diabetes medications -Check lipid and hepatic panel now that he is back on simvastatin -Routine follow-up in 3 months  Eulas Post MD [facility] at [facility]

## 2018-01-04 ENCOUNTER — Other Ambulatory Visit: Payer: Self-pay | Admitting: Family Medicine

## 2018-01-13 ENCOUNTER — Ambulatory Visit (INDEPENDENT_AMBULATORY_CARE_PROVIDER_SITE_OTHER): Payer: BLUE CROSS/BLUE SHIELD | Admitting: Family Medicine

## 2018-01-13 ENCOUNTER — Encounter: Payer: Self-pay | Admitting: Family Medicine

## 2018-01-13 ENCOUNTER — Other Ambulatory Visit: Payer: Self-pay

## 2018-01-13 VITALS — BP 118/70 | HR 92 | Temp 98.0°F | Wt 220.8 lb

## 2018-01-13 DIAGNOSIS — E785 Hyperlipidemia, unspecified: Secondary | ICD-10-CM

## 2018-01-13 DIAGNOSIS — E1165 Type 2 diabetes mellitus with hyperglycemia: Secondary | ICD-10-CM | POA: Diagnosis not present

## 2018-01-13 LAB — LIPID PANEL
Cholesterol: 179 mg/dL (ref 0–200)
HDL: 44.3 mg/dL (ref >39.00)
NONHDL: 134.44
TRIGLYCERIDES: 230 mg/dL — AB (ref 0.0–149.0)
Total CHOL/HDL Ratio: 4
VLDL: 46 mg/dL — ABNORMAL HIGH (ref 0.0–40.0)

## 2018-01-13 LAB — HEPATIC FUNCTION PANEL
ALBUMIN: 4.3 g/dL (ref 3.5–5.2)
ALK PHOS: 44 U/L (ref 39–117)
ALT: 20 U/L (ref 0–53)
AST: 13 U/L (ref 0–37)
BILIRUBIN DIRECT: 0.1 mg/dL (ref 0.0–0.3)
TOTAL PROTEIN: 6.7 g/dL (ref 6.0–8.3)
Total Bilirubin: 0.3 mg/dL (ref 0.2–1.2)

## 2018-01-13 LAB — POCT GLYCOSYLATED HEMOGLOBIN (HGB A1C): Hemoglobin A1C: 8 % — AB (ref 4.0–5.6)

## 2018-01-13 LAB — LDL CHOLESTEROL, DIRECT: Direct LDL: 113 mg/dL

## 2018-01-13 MED ORDER — EMPAGLIFLOZIN 10 MG PO TABS: 10.0000 mg | ORAL_TABLET | Freq: Every day | ORAL | 11 refills | Status: DC

## 2018-01-13 NOTE — Patient Instructions (Signed)
Try to lose some weight  Keep sugar and starch intake down  Start Jardiance one daily.

## 2018-01-13 NOTE — Progress Notes (Signed)
  Subjective:     Patient ID: Richard Carroll, male   DOB: 1970/06/20, 47 y.o.   MRN: [id number]  HPI Patient here for follow-up regarding type 2 diabetes.  Last A1c 7.0%.  He has had poor compliance with diet.  He is working 2 jobs and has very little time for exercise.  He has gained some weight steadily over the past few months.  Not monitoring blood sugars regularly.  Hyperlipidemia.  We recently got him back on simvastatin.  He has not had follow-up lipids yet.  No myalgias.  Past Medical History:  Diagnosis Date  . Arthritis   . Diabetes mellitus type II   . DIABETES MELLITUS, TYPE II 08/18/2009  . Hay fever    with allergies  . Hyperlipidemia   . HYPERLIPIDEMIA 08/18/2009   Past Surgical History:  Procedure Laterality Date  . KNEE SURGERY  2005   r. knee    reports that he has never smoked. His smokeless tobacco use includes chew. He reports that he drinks alcohol. He reports that he does not use drugs. family history includes Alcohol abuse in his maternal grandfather; Diabetes in his maternal grandmother and mother; Heart disease in his father; Hyperlipidemia in his mother; Hypertension in his mother; Prostate cancer in his father; Stroke in his mother. No Known Allergies   Review of Systems  Eyes: Negative for visual disturbance.  Respiratory: Negative for cough, chest tightness and shortness of breath.   Cardiovascular: Negative for chest pain, palpitations and leg swelling.  Endocrine: Negative for polydipsia and polyuria.  Neurological: Negative for dizziness, syncope, weakness, light-headedness and headaches.       Objective:   Physical Exam  Constitutional: He appears well-developed and well-nourished.  Cardiovascular: Normal rate and regular rhythm.  No murmur heard. Pulmonary/Chest: Effort normal and breath sounds normal.  Musculoskeletal: He exhibits no edema.       Assessment:     #1 type 2 diabetes.  Poorly controlled with A1c 8.0%  #2  Hyperlipidemia with recent initiation of simvastatin    Plan:     -Start Jardiance 10 mg p.o. Daily -Strongly encouraged to lose some weight -Check lipid panel and hepatic panel -Routine follow-up in 3 months and sooner as needed  Eulas Post MD [facility] at [facility]

## 2018-04-21 ENCOUNTER — Other Ambulatory Visit: Payer: Self-pay

## 2018-04-21 ENCOUNTER — Ambulatory Visit (INDEPENDENT_AMBULATORY_CARE_PROVIDER_SITE_OTHER): Payer: BLUE CROSS/BLUE SHIELD | Admitting: Family Medicine

## 2018-04-21 ENCOUNTER — Encounter: Payer: Self-pay | Admitting: Family Medicine

## 2018-04-21 VITALS — BP 130/78 | HR 84 | Temp 97.8°F | Wt 216.9 lb

## 2018-04-21 DIAGNOSIS — Z23 Encounter for immunization: Secondary | ICD-10-CM

## 2018-04-21 DIAGNOSIS — E7849 Other hyperlipidemia: Secondary | ICD-10-CM

## 2018-04-21 DIAGNOSIS — E1165 Type 2 diabetes mellitus with hyperglycemia: Secondary | ICD-10-CM

## 2018-04-21 DIAGNOSIS — E669 Obesity, unspecified: Secondary | ICD-10-CM | POA: Diagnosis not present

## 2018-04-21 LAB — POCT GLYCOSYLATED HEMOGLOBIN (HGB A1C): HEMOGLOBIN A1C: 8.5 % — AB (ref 4.0–5.6)

## 2018-04-21 NOTE — Progress Notes (Signed)
Subjective:     Patient ID: Richard Carroll, male   DOB: [birth year], 48 y.o.   MRN: [id number]  HPI Patient is here for follow-up regarding his medical issues.  He has obesity, type 2 diabetes, hyperlipidemia.  We added Jardiance last visit and these had no side effects.  His diabetes medications currently include Amaryl, Jardiance, and metformin.  However, poor compliance with metformin for his evening dose.  He generally remembers to take this in the morning.  Not monitoring blood sugars at home regularly.  Weight is down about 3 pounds from last visit.  He is unfortunately drinking about 2 L of soda every 3 days and drinks sweetened tea once or twice per day.  No polyuria or polydipsia  Hyperlipidemia treated with simvastatin.  Lipids were checked in the fall and these were reviewed.  No myalgias.  No recent chest pains.  Patient still needs flu vaccine.  Past Medical History:  Diagnosis Date  . Arthritis   . Diabetes mellitus type II   . DIABETES MELLITUS, TYPE II 08/18/2009  . Hay fever    with allergies  . Hyperlipidemia   . HYPERLIPIDEMIA 08/18/2009   Past Surgical History:  Procedure Laterality Date  . KNEE SURGERY  2005   r. knee    reports that he has never smoked. His smokeless tobacco use includes chew. He reports current alcohol use. He reports that he does not use drugs. family history includes Alcohol abuse in his maternal grandfather; Diabetes in his maternal grandmother and mother; Heart disease in his father; Hyperlipidemia in his mother; Hypertension in his mother; Prostate cancer in his father; Stroke in his mother. No Known Allergies   Review of Systems  Constitutional: Negative for fatigue and unexpected weight change.  Eyes: Negative for visual disturbance.  Respiratory: Negative for cough, chest tightness and shortness of breath.   Cardiovascular: Negative for chest pain, palpitations and leg swelling.  Endocrine: Negative for polydipsia and polyuria.   Neurological: Negative for dizziness, syncope, weakness, light-headedness and headaches.       Objective:   Physical Exam Constitutional:      Appearance: He is well-developed.  HENT:     Right Ear: External ear normal.     Left Ear: External ear normal.  Eyes:     Pupils: Pupils are equal, round, and reactive to light.  Neck:     Musculoskeletal: Neck supple.     Thyroid: No thyromegaly.  Cardiovascular:     Rate and Rhythm: Normal rate and regular rhythm.  Pulmonary:     Effort: Pulmonary effort is normal. No respiratory distress.     Breath sounds: Normal breath sounds. No wheezing or rales.  Neurological:     Mental Status: He is alert and oriented to person, place, and time.        Assessment:     #1 type 2 diabetes poorly controlled with A1c 8.5%.  This is somewhat surprising with recent addition of Jardiance and 3 pound weight loss from a few months ago.  Very poor compliance with diet.  He is getting lots of carbohydrates through liquids  #2 hyperlipidemia treated with simvastatin  #3 obesity with comorbidities of type 2 diabetes hyperlipidemia    Plan:     -Flu vaccine given -Long discussion with patient and his wife regarding diet.  We strongly advocated to get rid of liquid calories in the form of colas and sweetened tea.  I feel if he did this alone his blood sugars would  improve dramatically.  Also needs to step up compliance with metformin at night -We reviewed options including additional medication such as GLP-1 medication versus lifestyle modification and he prefers 2-month trial of the latter and will recheck A1c in 3 months  Eulas Post MD [facility] at [facility]

## 2018-04-21 NOTE — Addendum Note (Signed)
Addended by: Alverda Skeans on: 04/21/2018 09:25 AM   Modules accepted: Orders

## 2018-04-21 NOTE — Patient Instructions (Signed)
Tighten up diet- and especially taper back on the colas and sweet tea Reduce sugar and white starches. Let's plan on 3 month follow up.

## 2018-05-02 ENCOUNTER — Other Ambulatory Visit: Payer: Self-pay | Admitting: Family Medicine

## 2018-05-23 ENCOUNTER — Other Ambulatory Visit: Payer: Self-pay | Admitting: Family Medicine

## 2018-06-05 ENCOUNTER — Other Ambulatory Visit: Payer: Self-pay | Admitting: Family Medicine

## 2018-07-10 ENCOUNTER — Other Ambulatory Visit: Payer: Self-pay | Admitting: Family Medicine

## 2018-07-17 ENCOUNTER — Telehealth: Payer: Self-pay | Admitting: *Deleted

## 2018-07-17 NOTE — Telephone Encounter (Signed)
Copied from [place] [phone]. Topic: General - Other >> Jul 17, 2018  1:39 PM Richard Carroll wrote: Reason for CRM: Patient called to inquire about upcoming appointment on 07/21/2018 he wanted to know if he will be coming in the office since he has blood work that is normally done at this visit. I did inform him of the Web visit but he was still Carroll little curious. Patient asked for Carroll call back.

## 2018-07-17 NOTE — Telephone Encounter (Signed)
Called patient and LMOVM to return call  [place] for [facility] to Discuss results / PCP / recommendations / Schedule patient  Left a detailed message letting him know about [email or web address] and asked if he could call us back to let us know if he wants to use the cell phone number listed of [phone].   CRM Created.

## 2018-07-18 ENCOUNTER — Encounter: Payer: Self-pay | Admitting: Family Medicine

## 2018-07-21 ENCOUNTER — Other Ambulatory Visit: Payer: Self-pay

## 2018-07-21 ENCOUNTER — Ambulatory Visit (INDEPENDENT_AMBULATORY_CARE_PROVIDER_SITE_OTHER): Payer: BLUE CROSS/BLUE SHIELD | Admitting: Family Medicine

## 2018-07-21 DIAGNOSIS — E669 Obesity, unspecified: Secondary | ICD-10-CM | POA: Diagnosis not present

## 2018-07-21 DIAGNOSIS — E7849 Other hyperlipidemia: Secondary | ICD-10-CM

## 2018-07-21 DIAGNOSIS — E1165 Type 2 diabetes mellitus with hyperglycemia: Secondary | ICD-10-CM | POA: Diagnosis not present

## 2018-07-21 NOTE — Progress Notes (Signed)
Patient ID: Richard Carroll, male   DOB: Oct 07, 1970, 48 y.o.   MRN: [id number]  Virtual Visit via Video Note  I connected with Richard Carroll on 07/21/18 at  9:30 AM EDT by a video enabled telemedicine application and verified that I am speaking with the correct person using two identifiers.  Location patient: home Location provider:work or home office Persons participating in the virtual visit: patient, provider  I discussed the limitations of evaluation and management by telemedicine and the availability of in person appointments. The patient expressed understanding and agreed to proceed.   HPI: Richard Carroll has chronic problems including type 2 diabetes, obesity, hyperlipidemia.  A1c back in January was 8.5%.  He had been skipping some doses of metformin and also very poor compliance with diet.  He was consuming sodas as well as sweetened tea.  He has scaled back though not completely off sodas at this time.  He drinks about 1 soda per day.  Not monitoring blood sugars regularly.  He states his current weight is 212 pounds which would be about 4 pounds less than last visit.  Better compliance with medication.  No recent hypoglycemia.  Lipids were checked at follow-up and stable.  He remains on simvastatin.   ROS: See pertinent positives and negatives per HPI.  Past Medical History:  Diagnosis Date  . Arthritis   . Diabetes mellitus type II   . DIABETES MELLITUS, TYPE II 08/18/2009  . Hay fever    with allergies  . Hyperlipidemia   . HYPERLIPIDEMIA 08/18/2009    Past Surgical History:  Procedure Laterality Date  . KNEE SURGERY  2005   r. knee    Family History  Problem Relation Age of Onset  . Hyperlipidemia Mother   . Stroke Mother   . Hypertension Mother   . Diabetes Mother   . Heart disease Father   . Prostate cancer Father   . Diabetes Maternal Grandmother   . Alcohol abuse Maternal Grandfather     SOCIAL HX: Non-smoker.  He is married.  No regular alcohol use.  Works  at [employer] and has been putting in lots of extra hours recently   Current Outpatient Medications:  .  albuterol (PROVENTIL HFA;VENTOLIN HFA) 108 (90 Base) MCG/ACT inhaler, Inhale 2 puffs into the lungs every 6 (six) hours as needed for wheezing or shortness of breath., Disp: 1 Inhaler, Rfl: 0 .  augmented betamethasone dipropionate (DIPROLENE) 0.05 % ointment, Apply twice daily as needed no longer than 2 weeks of continuous use, Disp: 30 g, Rfl: 3 .  azelastine (ASTELIN) 0.1 % nasal spray, Place 2 sprays into both nostrils 2 (two) times daily as needed., Disp: 30 mL, Rfl: 1 .  B Complex-Biotin-FA (BIG 100) TABS, Take by mouth. Takes 1 once per day, Disp: , Rfl:  .  empagliflozin (JARDIANCE) 10 MG TABS tablet, Take 10 mg by mouth daily., Disp: 30 tablet, Rfl: 11 .  glimepiride (AMARYL) 4 MG tablet, TAKE 1 TABLET BY MOUTH ONCE DAILY BEFORE BREAKFAST, Disp: 30 tablet, Rfl: 0 .  glucose blood (ACCU-CHEK AVIVA) test strip, 1 each by Other route as directed. Use as instructed , Disp: , Rfl:  .  metFORMIN (GLUCOPHAGE) 500 MG tablet, TAKE 2 TABLETS BY MOUTH TWICE DAILY WITH MEALS, Disp: 120 tablet, Rfl: 2 .  simvastatin (ZOCOR) 80 MG tablet, Take 1/2 (one-half) tablet by mouth once daily, Disp: 90 tablet, Rfl: 1 .  triamcinolone (NASACORT AQ) 55 MCG/ACT AERO nasal inhaler, Place 2 sprays into the  nose daily., Disp: 1 Inhaler, Rfl: 12 .  triamcinolone cream (KENALOG) 0.1 %, Apply 1 application topically 2 (two) times daily., Disp: 30 g, Rfl: 3  EXAM:  VITALS per patient if applicable:  GENERAL: alert, oriented, appears well and in no acute distress  HEENT: atraumatic, conjunttiva clear, no obvious abnormalities on inspection of external nose and ears  NECK: normal movements of the head and neck  LUNGS: on inspection no signs of respiratory distress, breathing rate appears normal, no obvious gross SOB, gasping or wheezing  CV: no obvious cyanosis  MS: moves all visible extremities without  noticeable abnormality  PSYCH/NEURO: pleasant and cooperative, no obvious depression or anxiety, speech and thought processing grossly intact  ASSESSMENT AND PLAN:  Discussed the following assessment and plan:  #1 type 2 diabetes.  History of recent poor control and poor compliance.  He has stepped up some compliance both in terms of diet and medication by history -Refill Amaryl and metformin for 1 year -We will plan tentative follow-up in 3 months and recheck A1c then  #2 dyslipidemia -Continue simvastatin     I discussed the assessment and treatment plan with the patient. The patient was provided an opportunity to ask questions and all were answered. The patient agreed with the plan and demonstrated an understanding of the instructions.   The patient was advised to call back or seek an in-person evaluation if the symptoms worsen or if the condition fails to improve as anticipated.   Carolann Littler, MD

## 2018-08-10 ENCOUNTER — Other Ambulatory Visit: Payer: Self-pay | Admitting: Family Medicine

## 2018-08-22 ENCOUNTER — Other Ambulatory Visit: Payer: Self-pay | Admitting: Family Medicine

## 2018-10-02 ENCOUNTER — Other Ambulatory Visit: Payer: Self-pay | Admitting: Family Medicine

## 2018-10-17 ENCOUNTER — Telehealth: Payer: Self-pay

## 2018-10-17 NOTE — Telephone Encounter (Signed)
Copied from [place] [phone]. Topic: Appointment Scheduling - Scheduling Inquiry for Clinic >> Oct 17, 2018  1:42 PM Oneta Rack wrote: Relation to pt: self  Call back [phone]    Reason for call:  Patient inquiring if appoitment with Dr. Elease Hashimoto is virtual or face to face, appointment scheduled for 10/20/2018, please advise

## 2018-10-18 NOTE — Telephone Encounter (Signed)
Called patient and he wants an in office visit. Patient has been given number to call and has to wear a mask. Patient verbalized an understanding.

## 2018-10-20 ENCOUNTER — Ambulatory Visit (INDEPENDENT_AMBULATORY_CARE_PROVIDER_SITE_OTHER): Payer: BC Managed Care – PPO | Admitting: Family Medicine

## 2018-10-20 ENCOUNTER — Other Ambulatory Visit: Payer: Self-pay

## 2018-10-20 ENCOUNTER — Encounter: Payer: Self-pay | Admitting: Family Medicine

## 2018-10-20 VITALS — BP 128/72 | HR 95 | Temp 98.0°F | Ht 70.0 in | Wt 222.4 lb

## 2018-10-20 DIAGNOSIS — E1165 Type 2 diabetes mellitus with hyperglycemia: Secondary | ICD-10-CM | POA: Diagnosis not present

## 2018-10-20 DIAGNOSIS — E7849 Other hyperlipidemia: Secondary | ICD-10-CM | POA: Diagnosis not present

## 2018-10-20 DIAGNOSIS — E669 Obesity, unspecified: Secondary | ICD-10-CM | POA: Diagnosis not present

## 2018-10-20 LAB — POCT GLYCOSYLATED HEMOGLOBIN (HGB A1C): Hemoglobin A1C: 8.7 % — AB (ref 4.0–5.6)

## 2018-10-20 MED ORDER — TRULICITY 0.75 MG/0.5ML ~~LOC~~ SOAJ: 0.7500 mg | SUBCUTANEOUS | 11 refills | Status: DC

## 2018-10-20 NOTE — Progress Notes (Signed)
  Subjective:     Patient ID: Richard Carroll, male   DOB: 1970/04/25, 48 y.o.   MRN: [id number]  HPI Here for medical follow-up.  History of poorly controlled type 2 diabetes.  Last A1c 8.5%.  Currently on regimen of metformin, glimepiride, and Jardiance.  He states he is compliant with medications.  He works extremely hard with 2 jobs per week and gets no regular exercise.  He feels his dietary compliance has been fair  He has hyperlipidemia treated with simvastatin.  Denies any myalgias.  Last lipids were last October.  He denies any recent chest pains.  No dizziness.  No headaches.  No recent fever.  Past Medical History:  Diagnosis Date  . Arthritis   . Diabetes mellitus type II   . DIABETES MELLITUS, TYPE II 08/18/2009  . Hay fever    with allergies  . Hyperlipidemia   . HYPERLIPIDEMIA 08/18/2009   Past Surgical History:  Procedure Laterality Date  . KNEE SURGERY  2005   r. knee    reports that he has never smoked. His smokeless tobacco use includes chew. He reports current alcohol use. He reports that he does not use drugs. family history includes Alcohol abuse in his maternal grandfather; Diabetes in his maternal grandmother and mother; Heart disease in his father; Hyperlipidemia in his mother; Hypertension in his mother; Prostate cancer in his father; Stroke in his mother. No Known Allergies   Review of Systems  Constitutional: Negative for fatigue.  Eyes: Negative for visual disturbance.  Respiratory: Negative for cough, chest tightness and shortness of breath.   Cardiovascular: Negative for chest pain, palpitations and leg swelling.  Endocrine: Negative for polydipsia and polyuria.  Neurological: Negative for dizziness, syncope, weakness, light-headedness and headaches.       Objective:   Physical Exam Constitutional:      Appearance: He is well-developed.  HENT:     Right Ear: External ear normal.     Left Ear: External ear normal.  Eyes:     Pupils: Pupils  are equal, round, and reactive to light.  Neck:     Musculoskeletal: Neck supple.     Thyroid: No thyromegaly.  Cardiovascular:     Rate and Rhythm: Normal rate and regular rhythm.  Pulmonary:     Effort: Pulmonary effort is normal. No respiratory distress.     Breath sounds: Normal breath sounds. No wheezing or rales.  Musculoskeletal:     Right lower leg: No edema.     Left lower leg: No edema.  Neurological:     Mental Status: He is alert and oriented to person, place, and time.        Assessment:     #1 type 2 diabetes poorly controlled with A1c 8.7%  #2 dyslipidemia  #3 obesity    Plan:     -Discussed diabetes treatment options in some detail.  We recommended addition of GLP-1 medication with Trulicity 8.78 mg once weekly.  Reviewed potential side effects.  He does not have any contraindications such as history of pancreatitis or family history of medullary thyroid cancer or multiple endocrine neoplasia  -Strongly recommend try to lose some weight.  He has very little time for exercise with working 2 jobs  -Set up follow-up in 3 months and recheck lipids then along with other labs  Eulas Post MD [facility]

## 2018-12-07 ENCOUNTER — Other Ambulatory Visit: Payer: Self-pay | Admitting: Family Medicine

## 2018-12-15 ENCOUNTER — Encounter: Payer: Self-pay | Admitting: Family Medicine

## 2018-12-15 DIAGNOSIS — E119 Type 2 diabetes mellitus without complications: Secondary | ICD-10-CM | POA: Diagnosis not present

## 2018-12-15 LAB — HM DIABETES EYE EXAM

## 2019-01-03 ENCOUNTER — Other Ambulatory Visit: Payer: Self-pay | Admitting: Family Medicine

## 2019-01-19 ENCOUNTER — Encounter: Payer: Self-pay | Admitting: Family Medicine

## 2019-01-19 ENCOUNTER — Ambulatory Visit (INDEPENDENT_AMBULATORY_CARE_PROVIDER_SITE_OTHER): Payer: BC Managed Care – PPO | Admitting: Family Medicine

## 2019-01-19 ENCOUNTER — Other Ambulatory Visit: Payer: Self-pay

## 2019-01-19 VITALS — BP 110/80 | HR 82 | Temp 98.0°F | Wt 217.0 lb

## 2019-01-19 DIAGNOSIS — E1165 Type 2 diabetes mellitus with hyperglycemia: Secondary | ICD-10-CM

## 2019-01-19 DIAGNOSIS — E7849 Other hyperlipidemia: Secondary | ICD-10-CM | POA: Diagnosis not present

## 2019-01-19 DIAGNOSIS — N529 Male erectile dysfunction, unspecified: Secondary | ICD-10-CM | POA: Diagnosis not present

## 2019-01-19 DIAGNOSIS — L853 Xerosis cutis: Secondary | ICD-10-CM

## 2019-01-19 LAB — POCT GLYCOSYLATED HEMOGLOBIN (HGB A1C): Hemoglobin A1C: 7.1 % — AB (ref 4.0–5.6)

## 2019-01-19 MED ORDER — TRIAMCINOLONE ACETONIDE 0.5 % EX OINT: 1.0000 "application " | TOPICAL_OINTMENT | Freq: Two times a day (BID) | CUTANEOUS | 2 refills | Status: DC | PRN

## 2019-01-19 MED ORDER — SILDENAFIL CITRATE 100 MG PO TABS: 50.0000 mg | ORAL_TABLET | Freq: Every day | ORAL | 11 refills | Status: DC | PRN

## 2019-01-19 NOTE — Progress Notes (Signed)
Subjective:     Patient ID: Richard Carroll, male   DOB: January 02, 1971, 48 y.o.   MRN: [id number]  HPI   Richard Carroll is here for medical follow-up.  Type 2 diabetes with recent poor control.  Last A1c was 8.7%.  We added Trulicity.  He has had some nausea and even a couple episodes of vomiting but symptoms have been somewhat inconsistent.  He has lost about 5 pounds since starting Trulicity and otherwise tolerating well.  Not monitoring blood sugars regularly.  He has hyperlipidemia treated with simvastatin.  Needs follow-up lipids.  No myalgias.  He had some issues with erectile dysfunction during the past year.  Non-smoker.  Occasional nocturnal erections.  No known peripheral vascular disease.  He complains of dry skin involving the hands.  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  He has gloves on much of the day.  He has had significant pruritus and dryness and cracking of the hands.  Not responding to moisturizers.  He had flu vaccine few weeks ago.  Eye exam up-to-date  Past Medical History:  Diagnosis Date  . Arthritis   . Diabetes mellitus type II   . DIABETES MELLITUS, TYPE II 08/18/2009  . Hay fever    with allergies  . Hyperlipidemia   . HYPERLIPIDEMIA 08/18/2009   Past Surgical History:  Procedure Laterality Date  . KNEE SURGERY  2005   r. knee    reports that he has never smoked. His smokeless tobacco use includes chew. He reports current alcohol use. He reports that he does not use drugs. family history includes Alcohol abuse in his maternal grandfather; Diabetes in his maternal grandmother and mother; Heart disease in his father; Hyperlipidemia in his mother; Hypertension in his mother; Prostate cancer in his father; Stroke in his mother. No Known Allergies   Review of Systems  Constitutional: Negative for fatigue.  Eyes: Negative for visual disturbance.  Respiratory: Negative for cough, chest tightness and shortness of  breath.   Cardiovascular: Negative for chest pain, palpitations and leg swelling.  Endocrine: Negative for polydipsia and polyuria.  Skin: Positive for rash.  Neurological: Negative for dizziness, syncope, weakness, light-headedness and headaches.       Objective:   Physical Exam Constitutional:      Appearance: He is well-developed.  HENT:     Right Ear: External ear normal.     Left Ear: External ear normal.  Eyes:     Pupils: Pupils are equal, round, and reactive to light.  Neck:     Musculoskeletal: Neck supple.     Thyroid: No thyromegaly.  Cardiovascular:     Rate and Rhythm: Normal rate and regular rhythm.  Pulmonary:     Effort: Pulmonary effort is normal. No respiratory distress.     Breath sounds: Normal breath sounds. No wheezing or rales.  Skin:    Findings: Rash present.     Comments: He has very dry skin involving the hands and feet cracked areas and nonspecific eczematous changes involving several digits.  No pustules.  Neurological:     Mental Status: He is alert and oriented to person, place, and time.        Assessment:     #1 type 2 diabetes with history of poor control and hyperglycemia.  Improving with recent addition of Trulicity with 123456 today 7.1% which compares with 8.7% previously  #2 erectile dysfunction  #3 dry skin dermatitis  Plan:     -Continue current medications.  If his nausea does not improve over the next several weeks we may need to look at discontinuation of GLP-1 medication. -Trial of Viagra 100 mg 1/2 to 1 tablet daily as needed -Kenalog 0.5% ointment once or twice daily as needed to hand rash no more than 2 weeks continuously.  Keep hands out of water as much as possible. -Routine follow-up in 3 months and sooner as needed  Eulas Post MD [facility]

## 2019-01-20 LAB — BASIC METABOLIC PANEL
BUN: 12 mg/dL (ref 7–25)
CO2: 23 mmol/L (ref 20–32)
Calcium: 9.4 mg/dL (ref 8.6–10.3)
Chloride: 102 mmol/L (ref 98–110)
Creat: 0.86 mg/dL (ref 0.60–1.35)
Glucose, Bld: 104 mg/dL — ABNORMAL HIGH (ref 65–99)
Potassium: 4.1 mmol/L (ref 3.5–5.3)
Sodium: 140 mmol/L (ref 135–146)

## 2019-01-20 LAB — LIPID PANEL
Cholesterol: 149 mg/dL (ref <200)
HDL: 42 mg/dL (ref >=40)
LDL Cholesterol (Calc): 89 mg/dL (calc)
Non-HDL Cholesterol (Calc): 107 mg/dL (calc) (ref <130)
Total CHOL/HDL Ratio: 3.5 (calc) (ref <5.0)
Triglycerides: 89 mg/dL (ref <150)

## 2019-01-20 LAB — HEPATIC FUNCTION PANEL
AG Ratio: 2.1 (calc) (ref 1.0–2.5)
ALT: 18 U/L (ref 9–46)
AST: 16 U/L (ref 10–40)
Albumin: 4.5 g/dL (ref 3.6–5.1)
Alkaline phosphatase (APISO): 48 U/L (ref 36–130)
Bilirubin, Direct: 0.1 mg/dL (ref 0.0–0.2)
Globulin: 2.1 g/dL (calc) (ref 1.9–3.7)
Indirect Bilirubin: 0.5 mg/dL (calc) (ref 0.2–1.2)
Total Bilirubin: 0.6 mg/dL (ref 0.2–1.2)
Total Protein: 6.6 g/dL (ref 6.1–8.1)

## 2019-01-20 LAB — MICROALBUMIN / CREATININE URINE RATIO
Creatinine, Urine: 40 mg/dL (ref 20–320)
Microalb Creat Ratio: 8 mcg/mg creat (ref <30)
Microalb, Ur: 0.3 mg/dL

## 2019-01-23 ENCOUNTER — Telehealth: Payer: Self-pay | Admitting: *Deleted

## 2019-01-23 NOTE — Telephone Encounter (Signed)
Patient returned call for results:  Notes recorded by Eulas Post, MD on 01/21/2019 at 12:00 PM EDT  Lipids improved  Urine micro-albumin is negative.  Patient notified of results.

## 2019-02-03 ENCOUNTER — Other Ambulatory Visit: Payer: Self-pay | Admitting: Family Medicine

## 2019-03-14 ENCOUNTER — Other Ambulatory Visit: Payer: Self-pay | Admitting: Family Medicine

## 2019-03-25 ENCOUNTER — Other Ambulatory Visit: Payer: Self-pay | Admitting: Family Medicine

## 2019-03-28 ENCOUNTER — Other Ambulatory Visit: Payer: Self-pay

## 2019-03-28 ENCOUNTER — Encounter: Payer: Self-pay | Admitting: Family Medicine

## 2019-03-28 MED ORDER — TRIAMCINOLONE ACETONIDE 0.5 % EX OINT: 1.0000 "application " | TOPICAL_OINTMENT | Freq: Two times a day (BID) | CUTANEOUS | 2 refills | Status: DC | PRN

## 2019-04-20 ENCOUNTER — Ambulatory Visit (INDEPENDENT_AMBULATORY_CARE_PROVIDER_SITE_OTHER): Payer: BC Managed Care – PPO | Admitting: Family Medicine

## 2019-04-20 ENCOUNTER — Encounter: Payer: Self-pay | Admitting: Family Medicine

## 2019-04-20 ENCOUNTER — Other Ambulatory Visit: Payer: Self-pay

## 2019-04-20 VITALS — BP 128/74 | HR 91 | Temp 97.9°F | Ht 70.0 in | Wt 213.8 lb

## 2019-04-20 DIAGNOSIS — E1165 Type 2 diabetes mellitus with hyperglycemia: Secondary | ICD-10-CM

## 2019-04-20 DIAGNOSIS — M722 Plantar fascial fibromatosis: Secondary | ICD-10-CM | POA: Diagnosis not present

## 2019-04-20 LAB — POCT GLYCOSYLATED HEMOGLOBIN (HGB A1C): Hemoglobin A1C: 7.9 % — AB (ref 4.0–5.6)

## 2019-04-20 MED ORDER — TRULICITY 1.5 MG/0.5ML ~~LOC~~ SOAJ: 1.5000 mg | SUBCUTANEOUS | 11 refills | Status: DC

## 2019-04-20 MED ORDER — METFORMIN HCL 500 MG PO TABS: 1000.0000 mg | ORAL_TABLET | Freq: Two times a day (BID) | ORAL | 3 refills | Status: DC

## 2019-04-20 MED ORDER — GLIMEPIRIDE 4 MG PO TABS: ORAL_TABLET | ORAL | 3 refills | Status: DC

## 2019-04-20 NOTE — Patient Instructions (Signed)
Plantar Fasciitis  Plantar fasciitis is a painful foot condition that affects the heel. It occurs when the band of tissue that connects the toes to the heel bone (plantar fascia) becomes irritated. This can happen as the result of exercising too much or doing other repetitive activities (overuse injury). The pain from plantar fasciitis can range from mild irritation to severe pain that makes it difficult to walk or move. The pain is usually worse in the morning after sleeping, or after sitting or lying down for a while. Pain may also be worse after long periods of walking or standing. What are the causes? This condition may be caused by:  Standing for long periods of time.  Wearing shoes that do not have good arch support.  Doing activities that put stress on joints (high-impact activities), including running, aerobics, and ballet.  Being overweight.  An abnormal way of walking (gait).  Tight muscles in the back of your lower leg (calf).  High arches in your feet.  Starting a new athletic activity. What are the signs or symptoms? The main symptom of this condition is heel pain. Pain may:  Be worse with first steps after a time of rest, especially in the morning after sleeping or after you have been sitting or lying down for a while.  Be worse after long periods of standing still.  Decrease after 30-45 minutes of activity, such as gentle walking. How is this diagnosed? This condition may be diagnosed based on your medical history and your symptoms. Your health care provider may ask questions about your activity level. Your health care provider will do a physical exam to check for:  A tender area on the bottom of your foot.  A high arch in your foot.  Pain when you move your foot.  Difficulty moving your foot. You may have imaging tests to confirm the diagnosis, such as:  X-rays.  Ultrasound.  MRI. How is this treated? Treatment for plantar fasciitis depends on how  severe your condition is. Treatment may include:  Rest, ice, applying pressure (compression), and raising the affected foot (elevation). This may be called RICE therapy. Your health care provider may recommend RICE therapy along with over-the-counter pain medicines to manage your pain.  Exercises to stretch your calves and your plantar fascia.  A splint that holds your foot in a stretched, upward position while you sleep (night splint).  Physical therapy to relieve symptoms and prevent problems in the future.  Injections of steroid medicine (cortisone) to relieve pain and inflammation.  Stimulating your plantar fascia with electrical impulses (extracorporeal shock wave therapy). This is usually the last treatment option before surgery.  Surgery, if other treatments have not worked after 12 months. Follow these instructions at home:  Managing pain, stiffness, and swelling  If directed, put ice on the painful area: ? Put ice in a plastic bag, or use a frozen bottle of water. ? Place a towel between your skin and the bag or bottle. ? Roll the bottom of your foot over the bag or bottle. ? Do this for 20 minutes, 2-3 times a day.  Wear athletic shoes that have air-sole or gel-sole cushions, or try wearing soft shoe inserts that are designed for plantar fasciitis.  Raise (elevate) your foot above the level of your heart while you are sitting or lying down. Activity  Avoid activities that cause pain. Ask your health care provider what activities are safe for you.  Do physical therapy exercises and stretches as told   by your health care provider.  Try activities and forms of exercise that are easier on your joints (low-impact). Examples include swimming, water aerobics, and biking. General instructions  Take over-the-counter and prescription medicines only as told by your health care provider.  Wear a night splint while sleeping, if told by your health care provider. Loosen the splint  if your toes tingle, become numb, or turn cold and blue.  Maintain a healthy weight, or work with your health care provider to lose weight as needed.  Keep all follow-up visits as told by your health care provider. This is important. Contact a health care provider if you:  Have symptoms that do not go away after caring for yourself at home.  Have pain that gets worse.  Have pain that affects your ability to move or do your daily activities. Summary  Plantar fasciitis is a painful foot condition that affects the heel. It occurs when the band of tissue that connects the toes to the heel bone (plantar fascia) becomes irritated.  The main symptom of this condition is heel pain that may be worse after exercising too much or standing still for a long time.  Treatment varies, but it usually starts with rest, ice, compression, and elevation (RICE therapy) and over-the-counter medicines to manage pain. This information is not intended to replace advice given to you by your health care provider. Make sure you discuss any questions you have with your health care provider. Document Revised: 03/04/2017 Document Reviewed: 01/17/2017 Elsevier Patient Education  2020 Elsevier Inc[employer]  

## 2019-04-20 NOTE — Progress Notes (Signed)
  Subjective:     Patient ID: Richard Carroll, male   DOB: Aug 23, 1970, 49 y.o.   MRN: [id number]  HPI   Richard Carroll is seen for the following issues  Follow-up type 2 diabetes.  History of recent poor control.  He works extremely hard- usually 7 days/week and frequently 14 hours/day.  No time for exercise.  Eating habits are very erratic.  Sometimes skips meals.  Recent addition of Trulicity.  He also takes Jardiance, glimepiride, and Metformin.  Not monitoring blood sugars regularly.  He has had some recent weight loss.  He needs refills of Metformin and glimepiride.  New problem of right heel pain.  Started several weeks ago.  He is on his feet on hard floors much of the day.  No recent injury.  Location is plantar fascia medially and proximally.  No Achilles tenderness.  Pain worse first thing in the morning and after prolonged standing.  He has not tried any icing.  Past Medical History:  Diagnosis Date  . Arthritis   . Diabetes mellitus type II   . DIABETES MELLITUS, TYPE II 08/18/2009  . Hay fever    with allergies  . Hyperlipidemia   . HYPERLIPIDEMIA 08/18/2009   Past Surgical History:  Procedure Laterality Date  . KNEE SURGERY  2005   r. knee    reports that he has never smoked. His smokeless tobacco use includes chew. He reports current alcohol use. He reports that he does not use drugs. family history includes Alcohol abuse in his maternal grandfather; Diabetes in his maternal grandmother and mother; Heart disease in his father; Hyperlipidemia in his mother; Hypertension in his mother; Prostate cancer in his father; Stroke in his mother. No Known Allergies   Review of Systems  Constitutional: Negative for chills, fatigue and unexpected weight change.  Eyes: Negative for visual disturbance.  Respiratory: Negative for cough, chest tightness and shortness of breath.   Cardiovascular: Negative for chest pain, palpitations and leg swelling.  Endocrine: Negative for polydipsia and  polyuria.  Neurological: Negative for dizziness, syncope, weakness, light-headedness and headaches.       Objective:   Physical Exam Vitals reviewed.  Constitutional:      Appearance: Normal appearance.  Cardiovascular:     Rate and Rhythm: Normal rate and regular rhythm.  Pulmonary:     Effort: Pulmonary effort is normal.     Breath sounds: Normal breath sounds.  Musculoskeletal:     Right lower leg: No edema.     Left lower leg: No edema.     Comments: Right foot reveals no Achilles tenderness.  No retrocalcaneal tenderness.  He has tenderness over the plantar fascia proximally.  Neurological:     Mental Status: He is alert.     Comments: Full strength with plantar and dorsi- flexion.        Assessment:     #1 type 2 diabetes suboptimally controlled with A1c 7.9%  #2 right plantar fasciitis    Plan:     -We discussed conservative treatment with stretches, icing, heel cups -Discussed possible steroid injection and he would like to try the above first. -Increase Trulicity 1.5 mg subcutaneous once weekly  Eulas Post MD [facility]

## 2019-05-24 ENCOUNTER — Other Ambulatory Visit: Payer: Self-pay | Admitting: Family Medicine

## 2019-06-03 ENCOUNTER — Other Ambulatory Visit: Payer: Self-pay | Admitting: Family Medicine

## 2019-07-19 ENCOUNTER — Other Ambulatory Visit: Payer: Self-pay

## 2019-07-20 ENCOUNTER — Ambulatory Visit (INDEPENDENT_AMBULATORY_CARE_PROVIDER_SITE_OTHER): Payer: BC Managed Care – PPO | Admitting: Family Medicine

## 2019-07-20 VITALS — BP 120/76 | HR 96 | Temp 98.5°F | Wt 208.0 lb

## 2019-07-20 DIAGNOSIS — E1165 Type 2 diabetes mellitus with hyperglycemia: Secondary | ICD-10-CM

## 2019-07-20 LAB — POCT GLYCOSYLATED HEMOGLOBIN (HGB A1C): Hemoglobin A1C: 7.6 % — AB (ref 4.0–5.6)

## 2019-07-20 NOTE — Progress Notes (Signed)
  Subjective:     Patient ID: Richard Carroll, male   DOB: 09-Nov-1970, 49 y.o.   MRN: [id number]  HPI   Richard Carroll is seen for follow-up regarding his diabetes.  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  He feels this will be a better transition for him.  His last A1c was 7.9%.  We increased his Trulicity to 1.5.  He has lost 5 pounds.  Not monitoring blood sugars regularly.  Generally feels well.  He feels some better after losing some additional weight  Past Medical History:  Diagnosis Date  . Arthritis   . Diabetes mellitus type II   . DIABETES MELLITUS, TYPE II 08/18/2009  . Hay fever    with allergies  . Hyperlipidemia   . HYPERLIPIDEMIA 08/18/2009   Past Surgical History:  Procedure Laterality Date  . KNEE SURGERY  2005   r. knee    reports that he has never smoked. His smokeless tobacco use includes chew. He reports current alcohol use. He reports that he does not use drugs. family history includes Alcohol abuse in his maternal grandfather; Diabetes in his maternal grandmother and mother; Heart disease in his father; Hyperlipidemia in his mother; Hypertension in his mother; Prostate cancer in his father; Stroke in his mother. No Known Allergies  Wt Readings from Last 3 Encounters:  07/20/19 208 lb (94.3 kg)  04/20/19 213 lb 12.8 oz (97 kg)  01/19/19 217 lb (98.4 kg)     Review of Systems  Respiratory: Negative for shortness of breath.   Cardiovascular: Negative for chest pain.  Endocrine: Negative for polydipsia and polyuria.  Neurological: Negative for dizziness.       Objective:   Physical Exam Vitals reviewed.  Constitutional:      Appearance: Normal appearance.  Cardiovascular:     Rate and Rhythm: Normal rate and regular rhythm.  Pulmonary:     Effort: Pulmonary effort is normal.     Breath sounds: Normal breath sounds.  Musculoskeletal:     Right lower leg:  No edema.     Left lower leg: No edema.  Neurological:     Mental Status: He is alert.        Assessment:     -Type 2 diabetes slightly improved at 7.6% but still suboptimal control    Plan:     -We discussed options for additional improvement in his diabetes.  He prefers to give this 3 more months of weight loss and lifestyle modification to see if we can get this down further.  If not further to goal at that point we could consider other options such as low-dose Actos or possibly Betsey Amen MD [facility]

## 2019-07-20 NOTE — Patient Instructions (Signed)
Let's give this another 3 months with additional weight loss   If A1C not further to goal at that time we may need to look at additional medication

## 2019-10-10 ENCOUNTER — Other Ambulatory Visit: Payer: Self-pay | Admitting: Family Medicine

## 2019-10-19 ENCOUNTER — Encounter: Payer: Self-pay | Admitting: Family Medicine

## 2019-10-19 ENCOUNTER — Other Ambulatory Visit: Payer: Self-pay

## 2019-10-19 ENCOUNTER — Ambulatory Visit (INDEPENDENT_AMBULATORY_CARE_PROVIDER_SITE_OTHER): Payer: BC Managed Care – PPO | Admitting: Family Medicine

## 2019-10-19 VITALS — BP 126/68 | HR 90 | Temp 98.3°F | Wt 206.1 lb

## 2019-10-19 DIAGNOSIS — E1165 Type 2 diabetes mellitus with hyperglycemia: Secondary | ICD-10-CM | POA: Diagnosis not present

## 2019-10-19 LAB — POCT GLYCOSYLATED HEMOGLOBIN (HGB A1C): Hemoglobin A1C: 7.1 % — AB (ref 4.0–5.6)

## 2019-10-19 NOTE — Patient Instructions (Signed)
A1C improved to 7.1%.    Keep up the good work!  Let's plan on 3 month follow up. We will need to check other labs then including lipids.

## 2019-10-19 NOTE — Progress Notes (Signed)
Established Patient Office Visit  Subjective:  Patient ID: Richard Carroll, male    DOB: Aug 19, 1970  Age: 49 y.o. MRN: [id number]  CC:  Chief Complaint  Patient presents with   Follow-up    pt is here to follow up on his diabetes     HPI Richard Carroll presents for follow-up type 2 diabetes.  This has been suboptimally controlled but gradually improving.  His last A1c was 7.6% and down to 7.1% today.  He is on multiple medications including Trulicity, Jardiance, glimepiride, Metformin.  He states he has stepped up is compliant some with diet and has lost 2 pounds.  No polyuria or polydipsia.  No hypoglycemia.  Will be due for lipids in the fall.  He is getting eye exam once per year.  No history of neuropathy symptoms.  Past Medical History:  Diagnosis Date   Arthritis    Diabetes mellitus type II    DIABETES MELLITUS, TYPE II 08/18/2009   Hay fever    with allergies   Hyperlipidemia    HYPERLIPIDEMIA 08/18/2009    Past Surgical History:  Procedure Laterality Date   KNEE SURGERY  2005   r. knee    Family History  Problem Relation Age of Onset   Hyperlipidemia Mother    Stroke Mother    Hypertension Mother    Diabetes Mother    Heart disease Father    Prostate cancer Father    Diabetes Maternal Grandmother    Alcohol abuse Maternal Grandfather     Social History   Socioeconomic History   Marital status: Married    Spouse name: Not on file   Number of children: Not on file   Years of education: Not on file   Highest education level: Not on file  Occupational History   Not on file  Tobacco Use   Smoking status: Never Smoker   Smokeless tobacco: Current User    Types: Chew  Vaping Use   Vaping Use: Never used  Substance and Sexual Activity   Alcohol use: Yes   Drug use: No   Sexual activity: Not on file  Other Topics Concern   Not on file  Social History Narrative   Not on file   Social Determinants of Health    Financial Resource Strain:    Difficulty of Paying Living Expenses:   Food Insecurity:    Worried About Charity fundraiser in the Last Year:    Arboriculturist in the Last Year:   Transportation Needs:    Film/video editor (Medical):    Lack of Transportation (Non-Medical):   Physical Activity:    Days of Exercise per Week:    Minutes of Exercise per Session:   Stress:    Feeling of Stress :   Social Connections:    Frequency of Communication with Friends and Family:    Frequency of Social Gatherings with Friends and Family:    Attends Religious Services:    Active Member of Clubs or Organizations:    Attends Archivist Meetings:    Marital Status:   Intimate Partner Violence:    Fear of Current or Ex-Partner:    Emotionally Abused:    Physically Abused:    Sexually Abused:     Outpatient Medications Prior to Visit  Medication Sig Dispense Refill   augmented betamethasone dipropionate (DIPROLENE) 0.05 % ointment Apply twice daily as needed no longer than 2 weeks of continuous use 30 g 3  B Complex-Biotin-FA (BIG 100) TABS Take by mouth. Takes 1 once per day     Dulaglutide (TRULICITY) 1.5 DD/2.2GU SOPN Inject 1.5 mg into the skin once a week. 2 mL 11   glimepiride (AMARYL) 4 MG tablet TAKE 1 TABLET BY MOUTH EVERY DAY BEFORE BREAKFAST 90 tablet 3   glucose blood (ACCU-CHEK AVIVA) test strip 1 each by Other route as directed. Use as instructed      JARDIANCE 10 MG TABS tablet TAKE 1 TABLET BY MOUTH EVERY DAY 30 tablet 1   metFORMIN (GLUCOPHAGE) 500 MG tablet Take 2 tablets (1,000 mg total) by mouth 2 (two) times daily with a meal. 360 tablet 3   sildenafil (VIAGRA) 100 MG tablet Take 0.5-1 tablets (50-100 mg total) by mouth daily as needed for erectile dysfunction. 10 tablet 11   simvastatin (ZOCOR) 80 MG tablet TAKE 1/2 TABLET BY MOUTH ONCE A DAY 45 tablet 1   triamcinolone (NASACORT AQ) 55 MCG/ACT AERO nasal inhaler Place 2 sprays  into the nose daily. 1 Inhaler 12   triamcinolone cream (KENALOG) 0.1 % Apply 1 application topically 2 (two) times daily. 30 g 3   triamcinolone ointment (KENALOG) 0.5 % Apply 1 application topically 2 (two) times daily as needed. 30 g 2   albuterol (PROVENTIL HFA;VENTOLIN HFA) 108 (90 Base) MCG/ACT inhaler Inhale 2 puffs into the lungs every 6 (six) hours as needed for wheezing or shortness of breath. 1 Inhaler 0   azelastine (ASTELIN) 0.1 % nasal spray Place 2 sprays into both nostrils 2 (two) times daily as needed. 30 mL 1   No facility-administered medications prior to visit.    No Known Allergies  ROS Review of Systems  Constitutional: Negative for fatigue and unexpected weight change.  Eyes: Negative for visual disturbance.  Respiratory: Negative for cough, chest tightness and shortness of breath.   Cardiovascular: Negative for chest pain, palpitations and leg swelling.  Endocrine: Negative for polydipsia and polyuria.  Neurological: Negative for dizziness, syncope, weakness, light-headedness and headaches.      Objective:    Physical Exam Constitutional:      Appearance: He is well-developed.  Eyes:     Pupils: Pupils are equal, round, and reactive to light.  Neck:     Thyroid: No thyromegaly.  Cardiovascular:     Rate and Rhythm: Normal rate and regular rhythm.  Pulmonary:     Effort: Pulmonary effort is normal. No respiratory distress.     Breath sounds: Normal breath sounds. No wheezing or rales.  Musculoskeletal:     Cervical back: Neck supple.     Right lower leg: No edema.     Left lower leg: No edema.  Neurological:     Mental Status: He is alert and oriented to person, place, and time.     BP 126/68 (BP Location: Left Arm, Patient Position: Sitting, Cuff Size: Normal)    Pulse 90    Temp 98.3 F (36.8 C) (Temporal)    Wt 206 lb 1.6 oz (93.5 kg)    SpO2 98%    BMI 29.57 kg/m  Wt Readings from Last 3 Encounters:  10/19/19 206 lb 1.6 oz (93.5 kg)    07/20/19 208 lb (94.3 kg)  04/20/19 213 lb 12.8 oz (97 kg)     Health Maintenance Due  Topic Date Due   Hepatitis C Screening  Never done   COVID-19 Vaccine (1) Never done   HIV Screening  Never done    There are no preventive care  reminders to display for this patient.  No results found for: TSH Lab Results  Component Value Date   WBC 14.2 (H) 12/15/2015   HGB 15.7 12/15/2015   HCT 45.5 12/15/2015   MCV 88.9 12/15/2015   PLT 243 12/15/2015   Lab Results  Component Value Date   NA 140 01/19/2019   K 4.1 01/19/2019   CO2 23 01/19/2019   GLUCOSE 104 (H) 01/19/2019   BUN 12 01/19/2019   CREATININE 0.86 01/19/2019   BILITOT 0.6 01/19/2019   ALKPHOS 44 01/13/2018   AST 16 01/19/2019   ALT 18 01/19/2019   PROT 6.6 01/19/2019   ALBUMIN 4.3 01/13/2018   CALCIUM 9.4 01/19/2019   ANIONGAP 8 12/15/2015   GFR 98.08 07/05/2017   Lab Results  Component Value Date   CHOL 149 01/19/2019   Lab Results  Component Value Date   HDL 42 01/19/2019   Lab Results  Component Value Date   LDLCALC 89 01/19/2019   Lab Results  Component Value Date   TRIG 89 01/19/2019   Lab Results  Component Value Date   CHOLHDL 3.5 01/19/2019   Lab Results  Component Value Date   HGBA1C 7.1 (A) 10/19/2019      Assessment & Plan:   Type 2 diabetes improving with A1c 7.1%. -Continue healthy lifestyle changes.  We explained would like to see his weight down below 200 pounds if possible over the next year -We will recheck in 3 months and include lipids and chemistries at that visit -Continue with yearly eye exam -Reminder for flu vaccine in the fall  No orders of the defined types were placed in this encounter.   Follow-up: Return in about 3 months (around 01/19/2020).    Carolann Littler, MD

## 2019-11-06 DIAGNOSIS — R519 Headache, unspecified: Secondary | ICD-10-CM | POA: Diagnosis not present

## 2019-11-06 DIAGNOSIS — R5383 Other fatigue: Secondary | ICD-10-CM | POA: Diagnosis not present

## 2019-11-06 DIAGNOSIS — Z20828 Contact with and (suspected) exposure to other viral communicable diseases: Secondary | ICD-10-CM | POA: Diagnosis not present

## 2019-11-06 DIAGNOSIS — R05 Cough: Secondary | ICD-10-CM | POA: Diagnosis not present

## 2019-12-05 ENCOUNTER — Other Ambulatory Visit: Payer: Self-pay | Admitting: Family Medicine

## 2019-12-09 ENCOUNTER — Other Ambulatory Visit: Payer: Self-pay | Admitting: Family Medicine

## 2019-12-28 DIAGNOSIS — E119 Type 2 diabetes mellitus without complications: Secondary | ICD-10-CM | POA: Diagnosis not present

## 2019-12-28 LAB — HM DIABETES EYE EXAM

## 2020-01-18 ENCOUNTER — Ambulatory Visit (INDEPENDENT_AMBULATORY_CARE_PROVIDER_SITE_OTHER): Payer: BC Managed Care – PPO | Admitting: Family Medicine

## 2020-01-18 ENCOUNTER — Other Ambulatory Visit: Payer: Self-pay

## 2020-01-18 VITALS — BP 126/78 | HR 75 | Temp 98.1°F | Ht 70.0 in | Wt 194.4 lb

## 2020-01-18 DIAGNOSIS — E7849 Other hyperlipidemia: Secondary | ICD-10-CM | POA: Diagnosis not present

## 2020-01-18 DIAGNOSIS — E1165 Type 2 diabetes mellitus with hyperglycemia: Secondary | ICD-10-CM | POA: Diagnosis not present

## 2020-01-18 LAB — POCT GLYCOSYLATED HEMOGLOBIN (HGB A1C): Hemoglobin A1C: 6.9 % — AB (ref 4.0–5.6)

## 2020-01-18 NOTE — Addendum Note (Signed)
Addended by: Marrion Coy on: 01/18/2020 01:46 PM   Modules accepted: Orders

## 2020-01-18 NOTE — Progress Notes (Signed)
Established Patient Office Visit  Subjective:  Patient ID: Richard Carroll, male    DOB: 1970/12/08  Age: 49 y.o. MRN: [id number]  CC:  Chief Complaint  Patient presents with  . Diabetes    HPI LAVELL SUPPLE presents for medical follow-up.  He has history of type 2 diabetes and dyslipidemia.  He did get Covid back in July.  His wife was hospitalized for a few days.  He lost taste and smell.  These are coming back.  He lost a considerable amount of weight following Covid infection and is maintained a lot of that.  His weight is down about 12 pounds currently from previous weight.  Last A1c 7.1%.  Not monitoring blood sugars regularly but has had some recent fasting blood sugars run 130.  He is currently on combination regimen with Metformin, Jardiance, Trulicity, and glimepiride.  No recent hypoglycemic symptoms.  Generally feels well.  No dyspnea.  No chest pains.  Takes simvastatin for hyperlipidemia.  No myalgias.  Due for follow-up lipids  He declines flu vaccine.  He may consider getting this later at pharmacy  Past Medical History:  Diagnosis Date  . Arthritis   . Diabetes mellitus type II   . DIABETES MELLITUS, TYPE II 08/18/2009  . Hay fever    with allergies  . Hyperlipidemia   . HYPERLIPIDEMIA 08/18/2009    Past Surgical History:  Procedure Laterality Date  . KNEE SURGERY  2005   r. knee    Family History  Problem Relation Age of Onset  . Hyperlipidemia Mother   . Stroke Mother   . Hypertension Mother   . Diabetes Mother   . Heart disease Father   . Prostate cancer Father   . Diabetes Maternal Grandmother   . Alcohol abuse Maternal Grandfather     Social History   Socioeconomic History  . Marital status: Married    Spouse name: Not on file  . Number of children: Not on file  . Years of education: Not on file  . Highest education level: Not on file  Occupational History  . Not on file  Tobacco Use  . Smoking status: Never Smoker  . Smokeless  tobacco: Current User    Types: Chew  Vaping Use  . Vaping Use: Never used  Substance and Sexual Activity  . Alcohol use: Yes  . Drug use: No  . Sexual activity: Not on file  Other Topics Concern  . Not on file  Social History Narrative  . Not on file   Social Determinants of Health   Financial Resource Strain:   . Difficulty of Paying Living Expenses: Not on file  Food Insecurity:   . Worried About Charity fundraiser in the Last Year: Not on file  . Ran Out of Food in the Last Year: Not on file  Transportation Needs:   . Lack of Transportation (Medical): Not on file  . Lack of Transportation (Non-Medical): Not on file  Physical Activity:   . Days of Exercise per Week: Not on file  . Minutes of Exercise per Session: Not on file  Stress:   . Feeling of Stress : Not on file  Social Connections:   . Frequency of Communication with Friends and Family: Not on file  . Frequency of Social Gatherings with Friends and Family: Not on file  . Attends Religious Services: Not on file  . Active Member of Clubs or Organizations: Not on file  . Attends Archivist  Meetings: Not on file  . Marital Status: Not on file  Intimate Partner Violence:   . Fear of Current or Ex-Partner: Not on file  . Emotionally Abused: Not on file  . Physically Abused: Not on file  . Sexually Abused: Not on file    Outpatient Medications Prior to Visit  Medication Sig Dispense Refill  . augmented betamethasone dipropionate (DIPROLENE) 0.05 % ointment Apply twice daily as needed no longer than 2 weeks of continuous use 30 g 3  . B Complex-Biotin-FA (BIG 100) TABS Take by mouth. Takes 1 once per day    . Dulaglutide (TRULICITY) 1.5 QQ/2.2LN SOPN Inject 1.5 mg into the skin once a week. 2 mL 11  . glimepiride (AMARYL) 4 MG tablet TAKE 1 TABLET BY MOUTH EVERY DAY BEFORE BREAKFAST 90 tablet 3  . glucose blood (ACCU-CHEK AVIVA) test strip 1 each by Other route as directed. Use as instructed     .  JARDIANCE 10 MG TABS tablet TAKE 1 TABLET BY MOUTH EVERY DAY 90 tablet 0  . metFORMIN (GLUCOPHAGE) 500 MG tablet Take 2 tablets (1,000 mg total) by mouth 2 (two) times daily with a meal. 360 tablet 3  . sildenafil (VIAGRA) 100 MG tablet Take 0.5-1 tablets (50-100 mg total) by mouth daily as needed for erectile dysfunction. 10 tablet 11  . simvastatin (ZOCOR) 80 MG tablet TAKE 1/2 TABLET BY MOUTH ONCE A DAY 45 tablet 0  . triamcinolone (NASACORT AQ) 55 MCG/ACT AERO nasal inhaler Place 2 sprays into the nose daily. 1 Inhaler 12  . triamcinolone cream (KENALOG) 0.1 % Apply 1 application topically 2 (two) times daily. 30 g 3  . triamcinolone ointment (KENALOG) 0.5 % Apply 1 application topically 2 (two) times daily as needed. 30 g 2   No facility-administered medications prior to visit.    No Known Allergies  ROS Review of Systems  Constitutional: Negative for fatigue and unexpected weight change.  Eyes: Negative for visual disturbance.  Respiratory: Negative for cough, chest tightness and shortness of breath.   Cardiovascular: Negative for chest pain, palpitations and leg swelling.  Endocrine: Negative for polydipsia and polyuria.  Neurological: Negative for dizziness, syncope, weakness, light-headedness and headaches.      Objective:    Physical Exam Vitals reviewed.  Constitutional:      Appearance: Normal appearance.  Cardiovascular:     Rate and Rhythm: Normal rate and regular rhythm.  Pulmonary:     Effort: Pulmonary effort is normal.     Breath sounds: Normal breath sounds.  Musculoskeletal:     Right lower leg: No edema.     Left lower leg: No edema.  Neurological:     Mental Status: He is alert.     BP 126/78   Pulse 75   Temp 98.1 F (36.7 C)   Ht 5\' 10"  (1.778 m)   Wt 194 lb 6.4 oz (88.2 kg)   SpO2 98%   BMI 27.89 kg/m  Wt Readings from Last 3 Encounters:  01/18/20 194 lb 6.4 oz (88.2 kg)  10/19/19 206 lb 1.6 oz (93.5 kg)  07/20/19 208 lb (94.3 kg)      Health Maintenance Due  Topic Date Due  . COVID-19 Vaccine (1) Never done  . URINE MICROALBUMIN  01/19/2020    There are no preventive care reminders to display for this patient.  No results found for: TSH Lab Results  Component Value Date   WBC 14.2 (H) 12/15/2015   HGB 15.7 12/15/2015  HCT 45.5 12/15/2015   MCV 88.9 12/15/2015   PLT 243 12/15/2015   Lab Results  Component Value Date   NA 140 01/19/2019   K 4.1 01/19/2019   CO2 23 01/19/2019   GLUCOSE 104 (H) 01/19/2019   BUN 12 01/19/2019   CREATININE 0.86 01/19/2019   BILITOT 0.6 01/19/2019   ALKPHOS 44 01/13/2018   AST 16 01/19/2019   ALT 18 01/19/2019   PROT 6.6 01/19/2019   ALBUMIN 4.3 01/13/2018   CALCIUM 9.4 01/19/2019   ANIONGAP 8 12/15/2015   GFR 98.08 07/05/2017   Lab Results  Component Value Date   CHOL 149 01/19/2019   Lab Results  Component Value Date   HDL 42 01/19/2019   Lab Results  Component Value Date   LDLCALC 89 01/19/2019   Lab Results  Component Value Date   TRIG 89 01/19/2019   Lab Results  Component Value Date   CHOLHDL 3.5 01/19/2019   Lab Results  Component Value Date   HGBA1C 6.9 (A) 01/18/2020      Assessment & Plan:   Problem List Items Addressed This Visit      Unprioritized   Type 2 diabetes mellitus, uncontrolled ([place]) - Primary   Relevant Orders   POCT glycosylated hemoglobin (Hb A1C) (Completed)   Microalbumin / creatinine urine ratio   Hyperlipidemia   Relevant Orders   Lipid panel   Basic metabolic panel   Hepatic function panel    -Flu vaccine offered and declined -A1c improved to 6.9% -He is encouraged to try to keep his weight down. -Check labs above including lipids and chemistries -Plan routine follow-up in 4 months.  If A1c remains well controlled at that point consider every 56-month follow-up -Continue with yearly eye exams  No orders of the defined types were placed in this encounter.   Follow-up: Return in about 4 months  (around 05/20/2020).    Carolann Littler, MD

## 2020-01-18 NOTE — Patient Instructions (Signed)
° ° ° °  If you have lab work done today you will be contacted with your lab results within the next 2 weeks.  If you have not heard from us then please contact us. The fastest way to get your results is to register for My Chart. ° ° °IF you received an x-ray today, you will receive an invoice from [facility]. Please contact [facility] at [phone] with questions or concerns regarding your invoice.  ° °IF you received labwork today, you will receive an invoice from [facility]. Please contact [facility] at [phone] with questions or concerns regarding your invoice.  ° °Our billing staff will not be able to assist you with questions regarding bills from these companies. ° °You will be contacted with the lab results as soon as they are available. The fastest way to get your results is to activate your My Chart account. Instructions are located on the last page of this paperwork. If you have not heard from us regarding the results in 2 weeks, please contact this office. °  ° ° ° °

## 2020-01-18 NOTE — Addendum Note (Signed)
Addended by: Marrion Coy on: 01/18/2020 01:49 PM   Modules accepted: Orders

## 2020-01-19 LAB — MICROALBUMIN / CREATININE URINE RATIO
Creatinine, Urine: 89 mg/dL (ref 20–320)
Microalb Creat Ratio: 15 mcg/mg creat (ref <30)
Microalb, Ur: 1.3 mg/dL

## 2020-01-19 LAB — HEPATIC FUNCTION PANEL
AG Ratio: 2 (calc) (ref 1.0–2.5)
ALT: 19 U/L (ref 9–46)
AST: 13 U/L (ref 10–40)
Albumin: 4.6 g/dL (ref 3.6–5.1)
Alkaline phosphatase (APISO): 47 U/L (ref 36–130)
Bilirubin, Direct: 0.1 mg/dL (ref 0.0–0.2)
Globulin: 2.3 g/dL (calc) (ref 1.9–3.7)
Indirect Bilirubin: 0.4 mg/dL (calc) (ref 0.2–1.2)
Total Bilirubin: 0.5 mg/dL (ref 0.2–1.2)
Total Protein: 6.9 g/dL (ref 6.1–8.1)

## 2020-01-19 LAB — BASIC METABOLIC PANEL
BUN: 18 mg/dL (ref 7–25)
CO2: 26 mmol/L (ref 20–32)
Calcium: 10.2 mg/dL (ref 8.6–10.3)
Chloride: 103 mmol/L (ref 98–110)
Creat: 0.89 mg/dL (ref 0.60–1.35)
Glucose, Bld: 116 mg/dL — ABNORMAL HIGH (ref 65–99)
Potassium: 4.4 mmol/L (ref 3.5–5.3)
Sodium: 141 mmol/L (ref 135–146)

## 2020-01-19 LAB — LIPID PANEL
Cholesterol: 162 mg/dL (ref <200)
HDL: 46 mg/dL (ref >=40)
LDL Cholesterol (Calc): 93 mg/dL (calc)
Non-HDL Cholesterol (Calc): 116 mg/dL (calc) (ref <130)
Total CHOL/HDL Ratio: 3.5 (calc) (ref <5.0)
Triglycerides: 136 mg/dL (ref <150)

## 2020-01-23 ENCOUNTER — Emergency Department (HOSPITAL_COMMUNITY)
Admission: EM | Admit: 2020-01-23 | Discharge: 2020-01-23 | Disposition: A | Discharge status: Home / Self Care | Payer: Worker's Compensation | Attending: Emergency Medicine | Admitting: Emergency Medicine

## 2020-01-23 ENCOUNTER — Encounter (HOSPITAL_COMMUNITY): Payer: Self-pay | Admitting: Emergency Medicine

## 2020-01-23 DIAGNOSIS — Z7984 Long term (current) use of oral hypoglycemic drugs: Secondary | ICD-10-CM | POA: Diagnosis not present

## 2020-01-23 DIAGNOSIS — W228XXA Striking against or struck by other objects, initial encounter: Secondary | ICD-10-CM | POA: Insufficient documentation

## 2020-01-23 DIAGNOSIS — S0101XA Laceration without foreign body of scalp, initial encounter: Secondary | ICD-10-CM | POA: Diagnosis not present

## 2020-01-23 DIAGNOSIS — S0990XA Unspecified injury of head, initial encounter: Secondary | ICD-10-CM | POA: Diagnosis not present

## 2020-01-23 DIAGNOSIS — Z23 Encounter for immunization: Secondary | ICD-10-CM | POA: Diagnosis not present

## 2020-01-23 DIAGNOSIS — Y990 Civilian activity done for income or pay: Secondary | ICD-10-CM | POA: Insufficient documentation

## 2020-01-23 DIAGNOSIS — E119 Type 2 diabetes mellitus without complications: Secondary | ICD-10-CM | POA: Insufficient documentation

## 2020-01-23 MED ORDER — ACETAMINOPHEN 325 MG PO TABS
650.0000 mg | ORAL_TABLET | Freq: Once | ORAL | Status: AC
  Administered 2020-01-23: 650 mg via ORAL
  Filled 2020-01-23: qty 2

## 2020-01-23 MED ORDER — TETANUS-DIPHTH-ACELL PERTUSSIS 5-2.5-18.5 LF-MCG/0.5 IM SUSP
0.5000 mL | Freq: Once | INTRAMUSCULAR | Status: AC
  Administered 2020-01-23: 0.5 mL via INTRAMUSCULAR
  Filled 2020-01-23: qty 0.5

## 2020-01-23 MED ORDER — LIDOCAINE-EPINEPHRINE 1 %-1:100000 IJ SOLN
10.0000 mL | Freq: Once | INTRAMUSCULAR | Status: AC
  Administered 2020-01-23: 10 mL
  Filled 2020-01-23: qty 1

## 2020-01-23 NOTE — ED Provider Notes (Signed)
..  Laceration Repair  Date/Time: 01/23/2020 12:52 PM Performed by: Gifford Shave, MD Authorized by: Lennice Sites, DO   Consent:    Consent obtained:  Verbal   Consent given by:  Patient Anesthesia (see MAR for exact dosages):    Anesthesia method:  Local infiltration   Local anesthetic:  Lidocaine 1% WITH epi Laceration details:    Location:  Scalp   Scalp location:  Crown   Length (cm):  4.5   Depth (mm):  4 Repair type:    Repair type:  Simple Exploration:    Hemostasis achieved with:  Direct pressure and epinephrine   Contaminated: yes   Treatment:    Area cleansed with:  Saline   Amount of cleaning:  Standard   Irrigation solution:  Sterile saline   Irrigation method:  Syringe Skin repair:    Repair method:  Staples   Number of staples:  7 Approximation:    Approximation:  Close Post-procedure details:    Dressing:  Antibiotic ointment   Patient tolerance of procedure:  Tolerated well, no immediate complications      Gifford Shave, MD 01/23/20 [place], [place], DO 01/23/20 [id number]

## 2020-01-23 NOTE — Discharge Instructions (Addendum)
WOUND CARE Please return in 7-10 days to have your stitches/staples removed or sooner if you have concerns.  Keep area clean and dry for 24 hours. Do not remove bandage, if applied.  After 24 hours, remove bandage and wash wound gently with mild soap and warm water. Reapply a new bandage after cleaning wound, if directed.  Continue daily cleansing with soap and water until stitches/staples are removed.  Do not apply any ointments or creams to the wound while stitches/staples are in place, as this may cause delayed healing.  Notify the office if you experience any of the following signs of infection: Swelling, redness, pus drainage, streaking, fever >101.0 F  Notify the office if you experience excessive bleeding that does not stop after 15-20 minutes of constant, firm Pressure.  If you have any new or worsening concerning symptoms as we spoke about for head injury please come back to the emergency department including falling, numbness, tingling, vision changes, worsening head pain, weakness.   Your blood pressure was slightly elevated today, please have this rechecked by your PCP.

## 2020-01-23 NOTE — ED Provider Notes (Signed)
[facility] EMERGENCY DEPARTMENT Provider Note   CSN: [id number] Arrival date & time: 01/23/20  1159     History Chief Complaint  Patient presents with  . Head Injury    Richard Carroll is a 49 y.o. male with past medical history of hyperlipidemia, diabetes, arthritis that presents emergency department today for head injury.  Patient states that he was at work, and he accidentally got hit with a metal door, does have a laceration to the mid scalp.  Patient states when this occurred he did not lose consciousness, states that his pain is a 7/10 and does not radiate anywhere.  States that he is unsure when his last tetanus was.  No vision changes, nausea, vomiting, diaphoresis, chest pain, shortness of breath, injury elsewhere.  States that he does have some mild neck pain, did not hit his neck.  Patient states that he was in normal health before this.  Does not take anything for this.  Patient is not on a blood thinner.  HPI     Past Medical History:  Diagnosis Date  . Arthritis   . Diabetes mellitus type II   . DIABETES MELLITUS, TYPE II 08/18/2009  . Hay fever    with allergies  . Hyperlipidemia   . HYPERLIPIDEMIA 08/18/2009    Patient Active Problem List   Diagnosis Date Noted  . Acute sinus infection 07/09/2014  . Seborrheic dermatitis 06/16/2013  . Obesity (BMI 30-39.9) 12/16/2012  . Chronic eczema 12/15/2012  . Allergic rhinitis 12/18/2010  . Type 2 diabetes mellitus, uncontrolled ([place]) 08/18/2009  . Hyperlipidemia 08/18/2009    Past Surgical History:  Procedure Laterality Date  . KNEE SURGERY  2005   r. knee       Family History  Problem Relation Age of Onset  . Hyperlipidemia Mother   . Stroke Mother   . Hypertension Mother   . Diabetes Mother   . Heart disease Father   . Prostate cancer Father   . Diabetes Maternal Grandmother   . Alcohol abuse Maternal Grandfather     Social History   Tobacco Use  . Smoking status: Never  Smoker  . Smokeless tobacco: Current User    Types: Chew  Vaping Use  . Vaping Use: Never used  Substance Use Topics  . Alcohol use: Yes  . Drug use: No    Home Medications Prior to Admission medications   Medication Sig Start Date End Date Taking? Authorizing Provider  augmented betamethasone dipropionate (DIPROLENE) 0.05 % ointment Apply twice daily as needed no longer than 2 weeks of continuous use 11/26/13   Burchette, Alinda Sierras, MD  B Complex-Biotin-FA (BIG 100) TABS Take by mouth. Takes 1 once per day    [provider]  Dulaglutide (TRULICITY) 1.5 NK/5.3ZJ SOPN Inject 1.5 mg into the skin once a week. 04/20/19   Burchette, Alinda Sierras, MD  glimepiride (AMARYL) 4 MG tablet TAKE 1 TABLET BY MOUTH EVERY DAY BEFORE BREAKFAST 04/20/19   Burchette, Alinda Sierras, MD  glucose blood (ACCU-CHEK AVIVA) test strip 1 each by Other route as directed. Use as instructed     [provider]  JARDIANCE 10 MG TABS tablet TAKE 1 TABLET BY MOUTH EVERY DAY 12/15/19   Burchette, Alinda Sierras, MD  metFORMIN (GLUCOPHAGE) 500 MG tablet Take 2 tablets (1,000 mg total) by mouth 2 (two) times daily with a meal. 04/20/19   Burchette, Alinda Sierras, MD  sildenafil (VIAGRA) 100 MG tablet Take 0.5-1 tablets (50-100 mg total) by  mouth daily as needed for erectile dysfunction. 01/19/19   Burchette, Alinda Sierras, MD  simvastatin (ZOCOR) 80 MG tablet TAKE 1/2 TABLET BY MOUTH ONCE A DAY 12/15/19   Burchette, Alinda Sierras, MD    Allergies    Patient has no known allergies.  Review of Systems   Review of Systems  Constitutional: Negative for diaphoresis, fatigue and fever.  Eyes: Negative for visual disturbance.  Respiratory: Negative for shortness of breath.   Cardiovascular: Negative for chest pain.  Gastrointestinal: Negative for nausea and vomiting.  Musculoskeletal: Negative for back pain and myalgias.  Skin: Positive for wound. Negative for color change, pallor and rash.  Neurological: Negative for syncope, weakness,  light-headedness, numbness and headaches.  Psychiatric/Behavioral: Negative for behavioral problems and confusion.    Physical Exam Updated Vital Signs BP (!) 144/96 (BP Location: Right Arm)   Pulse (!) 103   Temp 98.9 F (37.2 C) (Oral)   Resp 18   Ht 5' 10.5" (1.791 m)   Wt 88 kg   SpO2 99%   BMI 27.44 kg/m   Physical Exam Constitutional:      General: He is not in acute distress.    Appearance: Normal appearance. He is not ill-appearing, toxic-appearing or diaphoretic.  HENT:     Head:     Comments: 4 cm laceration to mid scalp, more towards right side.  No lesions elsewhere.  Bleeding has been controlled.  Most likely 3 or 4 mm in depth.    Right Ear: Tympanic membrane and ear canal normal.     Left Ear: Tympanic membrane and ear canal normal.     Mouth/Throat:     Mouth: Mucous membranes are moist.     Pharynx: Oropharynx is clear.  Eyes:     General: No scleral icterus.    Extraocular Movements: Extraocular movements intact.     Pupils: Pupils are equal, round, and reactive to light.  Neck:     Comments: Full range of motion to neck without tenderness Cardiovascular:     Rate and Rhythm: Normal rate and regular rhythm.     Pulses: Normal pulses.     Heart sounds: Normal heart sounds.  Pulmonary:     Effort: Pulmonary effort is normal. No respiratory distress.     Breath sounds: Normal breath sounds. No stridor. No wheezing, rhonchi or rales.  Chest:     Chest wall: No tenderness.  Abdominal:     General: Abdomen is flat. There is no distension.     Palpations: Abdomen is soft.     Tenderness: There is no abdominal tenderness. There is no guarding or rebound.  Musculoskeletal:        General: No swelling or tenderness. Normal range of motion.     Cervical back: Normal range of motion and neck supple. No rigidity.     Right lower leg: No edema.     Left lower leg: No edema.     Comments: No midline tenderness to cervical, thoracic or lumbar spine.  No  paraspinal muscle tenderness.  Skin:    General: Skin is warm and dry.     Capillary Refill: Capillary refill takes less than 2 seconds.     Coloration: Skin is not pale.  Neurological:     General: No focal deficit present.     Mental Status: He is alert and oriented to person, place, and time.     Comments: Alert. Clear speech. No facial droop. CNIII-XII grossly intact. Bilateral upper and lower  extremities' sensation grossly intact. 5/5 symmetric strength with grip strength and with plantar and dorsi flexion bilaterally. Normal finger to nose bilaterally. Negative pronator drift. Negative Romberg sign. Gait is steady and intact    Psychiatric:        Mood and Affect: Mood normal.        Behavior: Behavior normal.     ED Results / Procedures / Treatments   Labs (all labs ordered are listed, but only abnormal results are displayed) Labs Reviewed - No data to display  EKG None  Radiology No results found.  Procedures Procedures (including critical care time)  Medications Ordered in ED Medications  Tdap (BOOSTRIX) injection 0.5 mL (0.5 mLs Intramuscular Given 01/23/20 1242)  acetaminophen (TYLENOL) tablet 650 mg (650 mg Oral Given 01/23/20 1242)  lidocaine-EPINEPHrine (XYLOCAINE W/EPI) 1 %-1:100000 (with pres) injection 10 mL (10 mLs Infiltration Given 01/23/20 1242)    ED Course  I have reviewed the triage vital signs and the nursing notes.  Pertinent labs & imaging results that were available during my care of the patient were reviewed by me and considered in my medical decision making (see chart for details).    MDM Rules/Calculators/A&P                         HAN VEJAR is a 49 y.o. male with past medical history of hyperlipidemia, diabetes, arthritis that presents emergency department today for head injury.  Patient has 4 cm laceration to mid scalp, will staple and reassess.Do not think we need CT scan at this time of had, patient did not lose consciousness,  patient has normal neuro exam including gait.  No midline tenderness to cervical spine with normal range of motion to neck.  Tdap updated today.  Family medicine resident, Gifford Shave did procedure, see his note. 7 staples to head, was irrigated pt tolerated well.   Doubt need for further emergent work up at this time.  Patient to follow-up with primary care in the next couple of days.  I explained the diagnosis and have given explicit precautions to return to the ER including for any other new or worsening symptoms.  Also discussed concussion-like symptoms if patient starts to develop these, patient will follow up with primary care next 5 days.  The patient understands and accepts the medical plan as it's been dictated and I have answered their questions. Discharge instructions concerning home care and prescriptions have been given. The patient is STABLE and is discharged to home in good condition.   Final Clinical Impression(s) / ED Diagnoses Final diagnoses:  Injury of head, initial encounter    Rx / DC Orders ED Discharge Orders    None       Alfredia Client, PA-C 01/23/20 [place], [place], DO 01/23/20 1536

## 2020-01-23 NOTE — ED Triage Notes (Signed)
Pt states he was hit in the head with a metal door while at work, has a laceration to R forehead, unsure of last tetanus, denies neck pain or LOC, a/ox4, ambulatory on arrival with nad.

## 2020-02-01 ENCOUNTER — Ambulatory Visit (HOSPITAL_COMMUNITY)
Admission: EM | Admit: 2020-02-01 | Discharge: 2020-02-01 | Disposition: A | Discharge status: Home / Self Care | Payer: BC Managed Care – PPO

## 2020-02-01 ENCOUNTER — Other Ambulatory Visit: Payer: Self-pay

## 2020-02-01 NOTE — ED Triage Notes (Signed)
Pt presents to have 7 staples removed from top of middle head.

## 2020-03-06 ENCOUNTER — Other Ambulatory Visit: Payer: Self-pay | Admitting: Family Medicine

## 2020-04-07 ENCOUNTER — Other Ambulatory Visit: Payer: Self-pay | Admitting: Family Medicine

## 2020-05-06 ENCOUNTER — Other Ambulatory Visit: Payer: Self-pay | Admitting: Family Medicine

## 2020-05-20 ENCOUNTER — Encounter: Payer: Self-pay | Admitting: Family Medicine

## 2020-05-20 ENCOUNTER — Ambulatory Visit (INDEPENDENT_AMBULATORY_CARE_PROVIDER_SITE_OTHER): Payer: BC Managed Care – PPO | Admitting: Family Medicine

## 2020-05-20 ENCOUNTER — Other Ambulatory Visit: Payer: Self-pay

## 2020-05-20 DIAGNOSIS — E1165 Type 2 diabetes mellitus with hyperglycemia: Secondary | ICD-10-CM

## 2020-05-20 LAB — POCT GLYCOSYLATED HEMOGLOBIN (HGB A1C): Hemoglobin A1C: 7.3 % — AB (ref 4.0–5.6)

## 2020-05-20 NOTE — Progress Notes (Signed)
Established Patient Office Visit  Subjective:  Patient ID: Richard Carroll, male    DOB: 01-30-1971  Age: 50 y.o. MRN: [id number]  CC:  Chief Complaint  Patient presents with  . Follow-up    HPI Richard Carroll presents for medical follow-up.  His current home glucose meter is broken and he has not been on monitor.  He has gained about 15 pounds due to poor compliance with diet over the winter.  He states he tends to gain more weight most winters.  No polyuria or polydipsia.  Last A1c was 6.9%.  He remains on combination therapy with Metformin, Jardiance, Trulicity, and glimepiride.  No recent hypoglycemic symptoms.  He had lipids done last fall that were stable.  No history of hypertension.  Past Medical History:  Diagnosis Date  . Arthritis   . Diabetes mellitus type II   . DIABETES MELLITUS, TYPE II 08/18/2009  . Hay fever    with allergies  . Hyperlipidemia   . HYPERLIPIDEMIA 08/18/2009    Past Surgical History:  Procedure Laterality Date  . KNEE SURGERY  2005   r. knee    Family History  Problem Relation Age of Onset  . Hyperlipidemia Mother   . Stroke Mother   . Hypertension Mother   . Diabetes Mother   . Heart disease Father   . Prostate cancer Father   . Diabetes Maternal Grandmother   . Alcohol abuse Maternal Grandfather     Social History   Socioeconomic History  . Marital status: Married    Spouse name: Not on file  . Number of children: Not on file  . Years of education: Not on file  . Highest education level: Not on file  Occupational History  . Not on file  Tobacco Use  . Smoking status: Never Smoker  . Smokeless tobacco: Current User    Types: Chew  Vaping Use  . Vaping Use: Never used  Substance and Sexual Activity  . Alcohol use: Yes  . Drug use: No  . Sexual activity: Not on file  Other Topics Concern  . Not on file  Social History Narrative  . Not on file   Social Determinants of Health   Financial Resource Strain: Not on  file  Food Insecurity: Not on file  Transportation Needs: Not on file  Physical Activity: Not on file  Stress: Not on file  Social Connections: Not on file  Intimate Partner Violence: Not on file    Outpatient Medications Prior to Visit  Medication Sig Dispense Refill  . glimepiride (AMARYL) 4 MG tablet TAKE 1 TABLET BY MOUTH EVERY DAY BEFORE BREAKFAST 90 tablet 3  . JARDIANCE 10 MG TABS tablet TAKE 1 TABLET BY MOUTH EVERY DAY 90 tablet 3  . metFORMIN (GLUCOPHAGE) 500 MG tablet TAKE 2 TABLETS (1,000 MG TOTAL) BY MOUTH 2 (TWO) TIMES DAILY WITH A MEAL. 360 tablet 2  . sildenafil (VIAGRA) 100 MG tablet Take 0.5-1 tablets (50-100 mg total) by mouth daily as needed for erectile dysfunction. 10 tablet 11  . simvastatin (ZOCOR) 80 MG tablet TAKE 1/2 TABLET BY MOUTH EVERY DAY 45 tablet 3  . TRULICITY 1.5 QZ/3.0QT SOPN INJECT 1 PEN INTO THE SKIN ONCE A WEEK. 1.5 mL 7  . augmented betamethasone dipropionate (DIPROLENE) 0.05 % ointment Apply twice daily as needed no longer than 2 weeks of continuous use 30 g 3  . B Complex-Biotin-FA (BIG 100) TABS Take by mouth. Takes 1 once per day    .  glucose blood (ACCU-CHEK AVIVA) test strip 1 each by Other route as directed. Use as instructed      No facility-administered medications prior to visit.    No Known Allergies  ROS Review of Systems  Constitutional: Negative for fatigue and unexpected weight change.  Eyes: Negative for visual disturbance.  Respiratory: Negative for cough, chest tightness and shortness of breath.   Cardiovascular: Negative for chest pain, palpitations and leg swelling.  Neurological: Negative for dizziness, syncope, weakness, light-headedness and headaches.      Objective:    Physical Exam Constitutional:      Appearance: He is well-developed and well-nourished.  HENT:     Right Ear: External ear normal.     Left Ear: External ear normal.     Mouth/Throat:     Mouth: Oropharynx is clear and moist.  Eyes:      Pupils: Pupils are equal, round, and reactive to light.  Neck:     Thyroid: No thyromegaly.  Cardiovascular:     Rate and Rhythm: Normal rate and regular rhythm.  Pulmonary:     Effort: Pulmonary effort is normal. No respiratory distress.     Breath sounds: Normal breath sounds. No wheezing or rales.  Musculoskeletal:        General: No edema.     Cervical back: Neck supple.  Neurological:     Mental Status: He is alert and oriented to person, place, and time.     BP 120/78 (BP Location: Left Arm, Cuff Size: Large)   Pulse 94   Temp 98.1 F (36.7 C) (Oral)   Ht 5' 10.5" (1.791 m)   Wt 209 lb 3.2 oz (94.9 kg)   SpO2 98%   BMI 29.59 kg/m  Wt Readings from Last 3 Encounters:  05/20/20 209 lb 3.2 oz (94.9 kg)  01/23/20 194 lb (88 kg)  01/18/20 194 lb 6.4 oz (88.2 kg)     Health Maintenance Due  Topic Date Due  . PNEUMOCOCCAL POLYSACCHARIDE VACCINE AGE 75-64 HIGH RISK  Never done  . COLONOSCOPY (Pts 45-41yrs Insurance coverage will need to be confirmed)  Never done    There are no preventive care reminders to display for this patient.  No results found for: TSH Lab Results  Component Value Date   WBC 14.2 (H) 12/15/2015   HGB 15.7 12/15/2015   HCT 45.5 12/15/2015   MCV 88.9 12/15/2015   PLT 243 12/15/2015   Lab Results  Component Value Date   NA 141 01/18/2020   K 4.4 01/18/2020   CO2 26 01/18/2020   GLUCOSE 116 (H) 01/18/2020   BUN 18 01/18/2020   CREATININE 0.89 01/18/2020   BILITOT 0.5 01/18/2020   ALKPHOS 44 01/13/2018   AST 13 01/18/2020   ALT 19 01/18/2020   PROT 6.9 01/18/2020   ALBUMIN 4.3 01/13/2018   CALCIUM 10.2 01/18/2020   ANIONGAP 8 12/15/2015   GFR 98.08 07/05/2017   Lab Results  Component Value Date   CHOL 162 01/18/2020   Lab Results  Component Value Date   HDL 46 01/18/2020   Lab Results  Component Value Date   LDLCALC 93 01/18/2020   Lab Results  Component Value Date   TRIG 136 01/18/2020   Lab Results  Component Value  Date   CHOLHDL 3.5 01/18/2020   Lab Results  Component Value Date   HGBA1C 7.3 (A) 05/20/2020      Assessment & Plan:   Problem List Items Addressed This Visit  Unprioritized   Type 2 diabetes mellitus, uncontrolled ([place])   Relevant Orders   POCT glycosylated hemoglobin (Hb A1C) (Completed)    A1c is up to 7.3% today likely reflecting recent weight gain.  We discussed options of increasing or adding additional medication versus lifestyle management and he prefers the latter.  He plans to tighten up diet and also anticipates more physical activity the months ahead.  Recheck in 4 months.  If not further to goal at that time consider titration of Jardiance or other options  No orders of the defined types were placed in this encounter.   Follow-up: Return in about 4 months (around 09/17/2020).    Carolann Littler, MD

## 2020-05-20 NOTE — Patient Instructions (Signed)
A1C today is 7.3%  Tighten up diet and try to lose some weight  Let's plan on 4 month follow up and if not closer to goal at that time we change change medications.

## 2020-05-31 ENCOUNTER — Other Ambulatory Visit: Payer: Self-pay | Admitting: Family Medicine

## 2020-09-19 ENCOUNTER — Ambulatory Visit: Payer: BC Managed Care – PPO | Admitting: Family Medicine

## 2020-09-26 ENCOUNTER — Ambulatory Visit: Payer: BC Managed Care – PPO | Admitting: Family Medicine

## 2020-12-06 DIAGNOSIS — Z20828 Contact with and (suspected) exposure to other viral communicable diseases: Secondary | ICD-10-CM | POA: Diagnosis not present

## 2021-01-03 HISTORY — PX: COLONOSCOPY: SHX174

## 2021-01-06 DIAGNOSIS — E119 Type 2 diabetes mellitus without complications: Secondary | ICD-10-CM | POA: Diagnosis not present

## 2021-01-07 ENCOUNTER — Other Ambulatory Visit: Payer: Self-pay

## 2021-01-07 ENCOUNTER — Encounter: Payer: Self-pay | Admitting: Gastroenterology

## 2021-01-07 ENCOUNTER — Ambulatory Visit (INDEPENDENT_AMBULATORY_CARE_PROVIDER_SITE_OTHER): Payer: BC Managed Care – PPO | Admitting: Family Medicine

## 2021-01-07 VITALS — BP 138/90 | HR 90 | Temp 97.8°F | Wt 208.8 lb

## 2021-01-07 DIAGNOSIS — Z8042 Family history of malignant neoplasm of prostate: Secondary | ICD-10-CM | POA: Diagnosis not present

## 2021-01-07 DIAGNOSIS — Z800 Family history of malignant neoplasm of digestive organs: Secondary | ICD-10-CM

## 2021-01-07 DIAGNOSIS — E1165 Type 2 diabetes mellitus with hyperglycemia: Secondary | ICD-10-CM

## 2021-01-07 DIAGNOSIS — E7849 Other hyperlipidemia: Secondary | ICD-10-CM

## 2021-01-07 LAB — LIPID PANEL
Cholesterol: 160 mg/dL (ref 0–200)
HDL: 52.2 mg/dL (ref >39.00)
LDL Cholesterol: 90 mg/dL (ref 0–99)
NonHDL: 107.84
Total CHOL/HDL Ratio: 3
Triglycerides: 90 mg/dL (ref 0.0–149.0)
VLDL: 18 mg/dL (ref 0.0–40.0)

## 2021-01-07 LAB — POCT GLYCOSYLATED HEMOGLOBIN (HGB A1C): Hemoglobin A1C: 8 % — AB (ref 4.0–5.6)

## 2021-01-07 LAB — BASIC METABOLIC PANEL
BUN: 15 mg/dL (ref 6–23)
CO2: 29 mEq/L (ref 19–32)
Calcium: 9.7 mg/dL (ref 8.4–10.5)
Chloride: 101 mEq/L (ref 96–112)
Creatinine, Ser: 0.96 mg/dL (ref 0.40–1.50)
GFR: 93.22 mL/min (ref >60.00)
Glucose, Bld: 129 mg/dL — ABNORMAL HIGH (ref 70–99)
Potassium: 4.7 mEq/L (ref 3.5–5.1)
Sodium: 138 mEq/L (ref 135–145)

## 2021-01-07 LAB — MICROALBUMIN / CREATININE URINE RATIO
Creatinine,U: 70.7 mg/dL
Microalb Creat Ratio: 1 mg/g (ref 0.0–30.0)
Microalb, Ur: <0.7 mg/dL (ref 0.0–1.9)

## 2021-01-07 LAB — PSA: PSA: 0.75 ng/mL (ref 0.10–4.00)

## 2021-01-07 LAB — HEPATIC FUNCTION PANEL
ALT: 17 U/L (ref 0–53)
AST: 13 U/L (ref 0–37)
Albumin: 4.4 g/dL (ref 3.5–5.2)
Alkaline Phosphatase: 54 U/L (ref 39–117)
Bilirubin, Direct: 0.1 mg/dL (ref 0.0–0.3)
Total Bilirubin: 0.5 mg/dL (ref 0.2–1.2)
Total Protein: 6.6 g/dL (ref 6.0–8.3)

## 2021-01-07 MED ORDER — EMPAGLIFLOZIN 25 MG PO TABS: 25.0000 mg | ORAL_TABLET | Freq: Every day | ORAL | 11 refills | Status: DC

## 2021-01-07 NOTE — Progress Notes (Signed)
Established Patient Office Visit  Subjective:  Patient ID: Richard Carroll, male    DOB: 09/06/1970  Age: 50 y.o. MRN: [id number]  CC:  Chief Complaint  Patient presents with   Follow-up    diabetes    HPI Richard Carroll presents for medical follow-up.  Still working 2 jobs.  Recently took up second job at a [employer] in [place].  Still works in [employer] full-time as well. Type 2 diabetes.  Last A1c 7.3%.  He has been skipping some doses of metformin and occasionally skipping Jardiance as well.  No regular exercise.  His diabetic medications include Trulicity, Jardiance, glimepiride, metformin.  He is on moderate-dose simvastatin for hyperlipidemia.  Denies any myalgias.  Just had eye exam yesterday.  Patient has never had any colon cancer screening.  He relates his father had diagnoses of both colon cancer and prostate cancer.  Past Medical History:  Diagnosis Date   Arthritis    Diabetes mellitus type II    DIABETES MELLITUS, TYPE II 08/18/2009   Hay fever    with allergies   Hyperlipidemia    HYPERLIPIDEMIA 08/18/2009    Past Surgical History:  Procedure Laterality Date   KNEE SURGERY  2005   r. knee    Family History  Problem Relation Age of Onset   Hyperlipidemia Mother    Stroke Mother    Hypertension Mother    Diabetes Mother    Heart disease Father    Prostate cancer Father    Diabetes Maternal Grandmother    Alcohol abuse Maternal Grandfather     Social History   Socioeconomic History   Marital status: Married    Spouse name: Not on file   Number of children: Not on file   Years of education: Not on file   Highest education level: Not on file  Occupational History   Not on file  Tobacco Use   Smoking status: Never   Smokeless tobacco: Current    Types: Chew  Vaping Use   Vaping Use: Never used  Substance and Sexual Activity   Alcohol use: Yes   Drug use: No   Sexual activity: Not on file  Other Topics Concern   Not  on file  Social History Narrative   Not on file   Social Determinants of Health   Financial Resource Strain: Not on file  Food Insecurity: Not on file  Transportation Needs: Not on file  Physical Activity: Not on file  Stress: Not on file  Social Connections: Not on file  Intimate Partner Violence: Not on file    Outpatient Medications Prior to Visit  Medication Sig Dispense Refill   glimepiride (AMARYL) 4 MG tablet TAKE 1 TABLET BY MOUTH EVERY DAY BEFORE BREAKFAST 90 tablet 2   metFORMIN (GLUCOPHAGE) 500 MG tablet TAKE 2 TABLETS (1,000 MG TOTAL) BY MOUTH 2 (TWO) TIMES DAILY WITH A MEAL. 360 tablet 2   sildenafil (VIAGRA) 100 MG tablet Take 0.5-1 tablets (50-100 mg total) by mouth daily as needed for erectile dysfunction. 10 tablet 11   simvastatin (ZOCOR) 80 MG tablet TAKE 1/2 TABLET BY MOUTH EVERY DAY 45 tablet 3   TRULICITY 1.5 ZJ/6.9CV SOPN INJECT 1 PEN INTO THE SKIN ONCE A WEEK. 1.5 mL 7   JARDIANCE 10 MG TABS tablet TAKE 1 TABLET BY MOUTH EVERY DAY 90 tablet 3   No facility-administered medications prior to visit.    No Known Allergies  ROS Review of Systems  Constitutional:  Negative for fatigue.  Eyes:  Negative for visual disturbance.  Respiratory:  Negative for cough, chest tightness and shortness of breath.   Cardiovascular:  Negative for chest pain, palpitations and leg swelling.  Endocrine: Negative for polydipsia and polyuria.  Neurological:  Negative for dizziness, syncope, weakness, light-headedness and headaches.     Objective:    Physical Exam Constitutional:      Appearance: He is well-developed.  HENT:     Right Ear: External ear normal.     Left Ear: External ear normal.  Eyes:     Pupils: Pupils are equal, round, and reactive to light.  Neck:     Thyroid: No thyromegaly.  Cardiovascular:     Rate and Rhythm: Normal rate and regular rhythm.  Pulmonary:     Effort: Pulmonary effort is normal. No respiratory distress.     Breath sounds: Normal  breath sounds. No wheezing or rales.  Musculoskeletal:     Cervical back: Neck supple.     Right lower leg: No edema.     Left lower leg: No edema.  Neurological:     Mental Status: He is alert and oriented to person, place, and time.    BP 138/90 (BP Location: Left Arm, Cuff Size: Normal)   Pulse 90   Temp 97.8 F (36.6 C) (Oral)   Wt 208 lb 12.8 oz (94.7 kg)   SpO2 99%   BMI 29.54 kg/m  Wt Readings from Last 3 Encounters:  01/07/21 208 lb 12.8 oz (94.7 kg)  05/20/20 209 lb 3.2 oz (94.9 kg)  01/23/20 194 lb (88 kg)     Health Maintenance Due  Topic Date Due   COVID-19 Vaccine (1) Never done   COLONOSCOPY (Pts 45-71yrs Insurance coverage will need to be confirmed)  Never done   INFLUENZA VACCINE  11/03/2020   URINE MICROALBUMIN  01/17/2021    There are no preventive care reminders to display for this patient.  No results found for: TSH Lab Results  Component Value Date   WBC 14.2 (H) 12/15/2015   HGB 15.7 12/15/2015   HCT 45.5 12/15/2015   MCV 88.9 12/15/2015   PLT 243 12/15/2015   Lab Results  Component Value Date   NA 141 01/18/2020   K 4.4 01/18/2020   CO2 26 01/18/2020   GLUCOSE 116 (H) 01/18/2020   BUN 18 01/18/2020   CREATININE 0.89 01/18/2020   BILITOT 0.5 01/18/2020   ALKPHOS 44 01/13/2018   AST 13 01/18/2020   ALT 19 01/18/2020   PROT 6.9 01/18/2020   ALBUMIN 4.3 01/13/2018   CALCIUM 10.2 01/18/2020   ANIONGAP 8 12/15/2015   GFR 98.08 07/05/2017   Lab Results  Component Value Date   CHOL 162 01/18/2020   Lab Results  Component Value Date   HDL 46 01/18/2020   Lab Results  Component Value Date   LDLCALC 93 01/18/2020   Lab Results  Component Value Date   TRIG 136 01/18/2020   Lab Results  Component Value Date   CHOLHDL 3.5 01/18/2020   Lab Results  Component Value Date   HGBA1C 8.0 (A) 01/07/2021      Assessment & Plan:   Problem List Items Addressed This Visit       Unprioritized   Type 2 diabetes mellitus,  uncontrolled ([place]) - Primary   Relevant Medications   empagliflozin (JARDIANCE) 25 MG TABS tablet   Other Relevant Orders   POCT glycosylated hemoglobin (Hb A1C) (Completed)   Basic metabolic panel  Microalbumin / creatinine urine ratio   Hyperlipidemia   Relevant Orders   Lipid panel   Hepatic function panel   Other Visit Diagnoses     Family history of prostate cancer       Relevant Orders   PSA     -Type 2 diabetes poorly controlled with A1c today 8.0%.  Has had some poor compliance with medications recently. -Increase Jardiance to 25 mg daily and set up 5-month follow-up.  Could titrate Trulicity further at that time if necessary  Set up referral for screening colonoscopy with positive family history as above  Check PSA with positive family history of prostate cancer in his father  Check lipid and hepatic panel with goal LDL less than 70  Meds ordered this encounter  Medications   empagliflozin (JARDIANCE) 25 MG TABS tablet    Sig: Take 1 tablet (25 mg total) by mouth daily before breakfast.    Dispense:  30 tablet    Refill:  11    Follow-up: Return in about 3 months (around 04/09/2021).    Carolann Littler, MD

## 2021-01-09 ENCOUNTER — Other Ambulatory Visit: Payer: Self-pay | Admitting: Family Medicine

## 2021-01-15 ENCOUNTER — Other Ambulatory Visit: Payer: Self-pay

## 2021-01-15 ENCOUNTER — Encounter: Payer: Self-pay | Admitting: Gastroenterology

## 2021-01-15 ENCOUNTER — Ambulatory Visit (AMBULATORY_SURGERY_CENTER): Payer: BC Managed Care – PPO

## 2021-01-15 VITALS — Ht 70.5 in | Wt 204.0 lb

## 2021-01-15 DIAGNOSIS — Z800 Family history of malignant neoplasm of digestive organs: Secondary | ICD-10-CM

## 2021-01-15 DIAGNOSIS — Z1211 Encounter for screening for malignant neoplasm of colon: Secondary | ICD-10-CM

## 2021-01-15 MED ORDER — PEG 3350-KCL-NA BICARB-NACL 420 G PO SOLR: 4000.0000 mL | Freq: Once | ORAL | 0 refills | Status: AC

## 2021-01-15 NOTE — Progress Notes (Signed)
Pre visit completed via phone call; Patient verified name, DOB, and address; No egg or soy allergy known to patient  No issues known to pt with past sedation with any surgeries or procedures Patient denies ever being told they had issues or difficulty with intubation  No FH of Malignant Hyperthermia Pt is not on diet pills Pt is not on home 02  Pt is not on blood thinners  Pt denies issues with constipation at this time; No A fib or A flutter NO PA's for preps discussed with pt in PV today  Discussed with pt there will be an out-of-pocket cost for prep and that varies from $0 to 70 +  dollars - pt verbalized understanding  Due to the COVID-19 pandemic we are asking patients to follow certain guidelines in PV and the [facility]   Pt aware of COVID protocols and [facility] guidelines

## 2021-01-30 ENCOUNTER — Other Ambulatory Visit: Payer: Self-pay

## 2021-01-30 ENCOUNTER — Other Ambulatory Visit (INDEPENDENT_AMBULATORY_CARE_PROVIDER_SITE_OTHER): Payer: BC Managed Care – PPO

## 2021-01-30 ENCOUNTER — Ambulatory Visit (AMBULATORY_SURGERY_CENTER): Payer: BC Managed Care – PPO | Admitting: Gastroenterology

## 2021-01-30 ENCOUNTER — Encounter: Payer: Self-pay | Admitting: Gastroenterology

## 2021-01-30 VITALS — BP 108/72 | HR 66 | Temp 98.4°F | Resp 15 | Ht 70.5 in | Wt 204.0 lb

## 2021-01-30 DIAGNOSIS — K529 Noninfective gastroenteritis and colitis, unspecified: Secondary | ICD-10-CM

## 2021-01-30 DIAGNOSIS — D124 Benign neoplasm of descending colon: Secondary | ICD-10-CM

## 2021-01-30 DIAGNOSIS — K5730 Diverticulosis of large intestine without perforation or abscess without bleeding: Secondary | ICD-10-CM

## 2021-01-30 DIAGNOSIS — K635 Polyp of colon: Secondary | ICD-10-CM | POA: Diagnosis not present

## 2021-01-30 DIAGNOSIS — K5289 Other specified noninfective gastroenteritis and colitis: Secondary | ICD-10-CM

## 2021-01-30 DIAGNOSIS — Z800 Family history of malignant neoplasm of digestive organs: Secondary | ICD-10-CM

## 2021-01-30 DIAGNOSIS — D125 Benign neoplasm of sigmoid colon: Secondary | ICD-10-CM

## 2021-01-30 DIAGNOSIS — K640 First degree hemorrhoids: Secondary | ICD-10-CM

## 2021-01-30 DIAGNOSIS — Z1211 Encounter for screening for malignant neoplasm of colon: Secondary | ICD-10-CM

## 2021-01-30 DIAGNOSIS — D128 Benign neoplasm of rectum: Secondary | ICD-10-CM

## 2021-01-30 DIAGNOSIS — D122 Benign neoplasm of ascending colon: Secondary | ICD-10-CM

## 2021-01-30 DIAGNOSIS — K621 Rectal polyp: Secondary | ICD-10-CM

## 2021-01-30 LAB — SEDIMENTATION RATE: Sed Rate: 2 mm/hr (ref 0–15)

## 2021-01-30 LAB — C-REACTIVE PROTEIN: CRP: <1 mg/dL (ref 0.5–20.0)

## 2021-01-30 MED ORDER — SODIUM CHLORIDE 0.9 % IV SOLN: 500.0000 mL | Freq: Once | INTRAVENOUS | Status: DC

## 2021-01-30 NOTE — Progress Notes (Signed)
Pt's states no medical or surgical changes since previsit or office visit. VS assessed by D.T 

## 2021-01-30 NOTE — Progress Notes (Signed)
GASTROENTEROLOGY PROCEDURE H&P NOTE   Primary Care Physician: Eulas Post, MD    Reason for Procedure:  Colon Cancer screening  Plan:    Colonoscopy  Patient is appropriate for endoscopic procedure(s) in the ambulatory ([facility]) setting.  The nature of the procedure, as well as the risks, benefits, and alternatives were carefully and thoroughly reviewed with the patient. Ample time for discussion and questions allowed. The patient understood, was satisfied, and agreed to proceed.     HPI: Richard Carroll is a 50 y.o. male who presents for colonoscopy for routine Colon Cancer screening.  No active GI symptoms.  Family history notable for father with colon and prostate CA. Patient is otherwise without complaints or active issues today.  Past Medical History:  Diagnosis Date   Arthritis    Diabetes mellitus type II    DIABETES MELLITUS, TYPE II 08/18/2009   on meds   Hay fever    with allergies   Hyperlipidemia    HYPERLIPIDEMIA 08/18/2009   on meds   Seasonal allergies     Past Surgical History:  Procedure Laterality Date   FINGER SURGERY Right    ring finger   KNEE SURGERY Right 2005    Prior to Admission medications   Medication Sig Start Date End Date Taking? Authorizing Provider  empagliflozin (JARDIANCE) 25 MG TABS tablet Take 1 tablet (25 mg total) by mouth daily before breakfast. 01/07/21  Yes Burchette, Alinda Sierras, MD  glimepiride (AMARYL) 4 MG tablet TAKE 1 TABLET BY MOUTH EVERY DAY BEFORE BREAKFAST 06/02/20  Yes Burchette, Alinda Sierras, MD  metFORMIN (GLUCOPHAGE) 500 MG tablet TAKE 2 TABLETS (1,000 MG TOTAL) BY MOUTH 2 (TWO) TIMES DAILY WITH A MEAL. 04/08/20  Yes Burchette, Alinda Sierras, MD  Multiple Vitamins-Minerals (MULTIVITAMIN) LIQD Take 1 tablet by mouth daily at 6 (six) AM.   Yes [provider]  simvastatin (ZOCOR) 80 MG tablet TAKE 1/2 TABLET BY MOUTH EVERY DAY 03/06/20  Yes Burchette, Alinda Sierras, MD  sildenafil (VIAGRA) 100 MG tablet Take 0.5-1 tablets  (50-100 mg total) by mouth daily as needed for erectile dysfunction. 01/19/19   Burchette, Alinda Sierras, MD  TRULICITY 1.5 BW/3.8LH SOPN INJECT 1 PEN INTO THE SKIN ONCE A WEEK. 05/06/20   Burchette, Alinda Sierras, MD    Current Outpatient Medications  Medication Sig Dispense Refill   empagliflozin (JARDIANCE) 25 MG TABS tablet Take 1 tablet (25 mg total) by mouth daily before breakfast. 30 tablet 11   glimepiride (AMARYL) 4 MG tablet TAKE 1 TABLET BY MOUTH EVERY DAY BEFORE BREAKFAST 90 tablet 2   metFORMIN (GLUCOPHAGE) 500 MG tablet TAKE 2 TABLETS (1,000 MG TOTAL) BY MOUTH 2 (TWO) TIMES DAILY WITH A MEAL. 360 tablet 2   Multiple Vitamins-Minerals (MULTIVITAMIN) LIQD Take 1 tablet by mouth daily at 6 (six) AM.     simvastatin (ZOCOR) 80 MG tablet TAKE 1/2 TABLET BY MOUTH EVERY DAY 45 tablet 3   sildenafil (VIAGRA) 100 MG tablet Take 0.5-1 tablets (50-100 mg total) by mouth daily as needed for erectile dysfunction. 10 tablet 11   TRULICITY 1.5 TD/4.2AJ SOPN INJECT 1 PEN INTO THE SKIN ONCE A WEEK. 1.5 mL 7   Current Facility-Administered Medications  Medication Dose Route Frequency Provider Last Rate Last Admin   0.9 %  sodium chloride infusion  500 mL Intravenous Once Lilliane Sposito V, DO        Allergies as of 01/30/2021   (No Known Allergies)    Family History  Problem  Relation Age of Onset   Hyperlipidemia Mother    Stroke Mother    Hypertension Mother    Diabetes Mother    Colon cancer Father 60   Heart disease Father    Prostate cancer Father 39   Diabetes Maternal Grandmother    Alcohol abuse Maternal Grandfather    Colon polyps Neg Hx    Esophageal cancer Neg Hx    Stomach cancer Neg Hx    Rectal cancer Neg Hx     Social History   Socioeconomic History   Marital status: Married    Spouse name: Not on file   Number of children: Not on file   Years of education: Not on file   Highest education level: Not on file  Occupational History   Not on file  Tobacco Use   Smoking  status: Never   Smokeless tobacco: Current    Types: Chew  Vaping Use   Vaping Use: Never used  Substance and Sexual Activity   Alcohol use: Not Currently   Drug use: No   Sexual activity: Not on file  Other Topics Concern   Not on file  Social History Narrative   Not on file   Social Determinants of Health   Financial Resource Strain: Not on file  Food Insecurity: Not on file  Transportation Needs: Not on file  Physical Activity: Not on file  Stress: Not on file  Social Connections: Not on file  Intimate Partner Violence: Not on file    Physical Exam: Vital signs in last 24 hours: @BP  125/77   Pulse 75   Temp 98.4 F (36.9 C) (Skin)   Resp 12   Ht 5' 10.5" (1.791 m)   Wt 204 lb (92.5 kg)   SpO2 98%   BMI 28.86 kg/m  GEN: NAD EYE: Sclerae anicteric ENT: MMM CV: Non-tachycardic Pulm: CTA b/l GI: Soft, NT/ND NEURO:  Alert & Oriented x 3   Gerrit Heck, DO [facility]   01/30/2021 7:56 AM

## 2021-01-30 NOTE — Patient Instructions (Signed)
Impression/Recommendations:  Polyp, diverticulosis, and hemorrhoid handouts given to patient.  Resume previous diet. Continue present medications. Await pathologoy results.  Repeat colonscopy for surveillance.  Date to be determined based on pathology results.  Check ESR, CRP, and fecal calprotectin.  Return to GI clinic at appointment to be scheduled.  YOU HAD AN ENDOSCOPIC PROCEDURE TODAY AT [facility]:   Refer to the procedure report that was given to you for any specific questions about what was found during the examination.  If the procedure report does not answer your questions, please call your gastroenterologist to clarify.  If you requested that your care partner not be given the details of your procedure findings, then the procedure report has been included in a sealed envelope for you to review at your convenience later.  YOU SHOULD EXPECT: Some feelings of bloating in the abdomen. Passage of more gas than usual.  Walking can help get rid of the air that was put into your GI tract during the procedure and reduce the bloating. If you had a lower endoscopy (such as a colonoscopy or flexible sigmoidoscopy) you may notice spotting of blood in your stool or on the toilet paper. If you underwent a bowel prep for your procedure, you may not have a normal bowel movement for a few days.  Please Note:  You might notice some irritation and congestion in your nose or some drainage.  This is from the oxygen used during your procedure.  There is no need for concern and it should clear up in a day or so.  SYMPTOMS TO REPORT IMMEDIATELY:  Following lower endoscopy (colonoscopy or flexible sigmoidoscopy):  Excessive amounts of blood in the stool  Significant tenderness or worsening of abdominal pains  Swelling of the abdomen that is new, acute  Fever of 100F or higher For urgent or emergent issues, a gastroenterologist can be reached at any hour by calling [phone]. Do  not use MyChart messaging for urgent concerns.    DIET:  We do recommend a small meal at first, but then you may proceed to your regular diet.  Drink plenty of fluids but you should avoid alcoholic beverages for 24 hours.  ACTIVITY:  You should plan to take it easy for the rest of today and you should NOT DRIVE or use heavy machinery until tomorrow (because of the sedation medicines used during the test).    FOLLOW UP: Our staff will call the number listed on your records 48-72 hours following your procedure to check on you and address any questions or concerns that you may have regarding the information given to you following your procedure. If we do not reach you, we will leave a message.  We will attempt to reach you two times.  During this call, we will ask if you have developed any symptoms of COVID 19. If you develop any symptoms (ie: fever, flu-like symptoms, shortness of breath, cough etc.) before then, please call [phone].  If you test positive for Covid 19 in the 2 weeks post procedure, please call and report this information to [place].    If any biopsies were taken you will be contacted by phone or by letter within the next 1-3 weeks.  Please call us at [phone] if you have not heard about the biopsies in 3 weeks.    SIGNATURES/CONFIDENTIALITY: You and/or your care partner have signed paperwork which will be entered into your electronic medical record.  These signatures attest to the fact that  that the information above on your After Visit Summary has been reviewed and is understood.  Full responsibility of the confidentiality of this discharge information lies with you and/or your care-partner.

## 2021-01-30 NOTE — Op Note (Signed)
[facility] Patient Name: Richard Carroll Procedure Date: 01/30/2021 8:00 AM MRN: [id number] Endoscopist: Gerrit Heck , MD Age: 50 Referring MD:  Date of Birth: [birth year] Gender: Male Account #: [id number] Procedure:                Colonoscopy Indications:              Screening in patient at increased risk: Colorectal                            cancer in father before age 6, This is the                            patient's first colonoscopy Medicines:                Monitored Anesthesia Care Procedure:                Pre-Anesthesia Assessment:                           - Prior to the procedure, a History and Physical                            was performed, and patient medications and                            allergies were reviewed. The patient's tolerance of                            previous anesthesia was also reviewed. The risks                            and benefits of the procedure and the sedation                            options and risks were discussed with the patient.                            All questions were answered, and informed consent                            was obtained. Prior Anticoagulants: The patient has                            taken no previous anticoagulant or antiplatelet                            agents. ASA Grade Assessment: II - A patient with                            mild systemic disease. After reviewing the risks                            and benefits, the patient was deemed in  satisfactory condition to undergo the procedure.                           After obtaining informed consent, the colonoscope                            was passed under direct vision. Throughout the                            procedure, the patient's blood pressure, pulse, and                            oxygen saturations were monitored continuously. The                            CF [id number] #[id number] was introduced  through the anus                            and advanced to the the terminal ileum. The                            colonoscopy was performed without difficulty. The                            patient tolerated the procedure well. The quality                            of the bowel preparation was good. The terminal                            ileum, ileocecal valve, appendiceal orifice, and                            rectum were photographed. Scope In: 8:01:53 AM Scope Out: 8:27:11 AM Scope Withdrawal Time: 0 hours 22 minutes 57 seconds  Total Procedure Duration: 0 hours 25 minutes 18 seconds  Findings:                 The perianal and digital rectal examinations were                            normal.                           Two sessile polyps were found in the ascending                            colon. The polyps were 3 to 5 mm in size. These                            polyps were removed with a cold snare. Resection                            and retrieval were complete. Estimated blood loss  was minimal.                           Three sessile polyps were found in the sigmoid                            colon and descending colon. The polyps were 3 to 5                            mm in size. These polyps were removed with a cold                            snare. Resection and retrieval were complete.                            Estimated blood loss was minimal.                           Patchy moderate inflammation characterized by                            congestion (edema), erythema and aphthous                            ulcerations was found in the transverse colon, at                            the hepatic flexure, in the ascending colon and in                            the cecum. There was mild patchy inflammation in                            the sigmoid colon, in the descending colon.                            Biopsies were taken with a cold  forceps for                            histology. Estimated blood loss was minimal.                           A 4 mm polyp was found in the rectum. The polyp was                            sessile. The polyp was removed with a cold snare.                            Resection and retrieval were complete. Estimated                            blood loss was minimal.  A few small-mouthed diverticula were found in the                            sigmoid colon.                           Normal mucosa was found in the rectum.                           Non-bleeding internal hemorrhoids were found during                            retroflexion. The hemorrhoids were small and Grade                            I (internal hemorrhoids that do not prolapse).                           The terminal ileum appeared normal. Complications:            No immediate complications. Estimated Blood Loss:     Estimated blood loss was minimal. Impression:               - Two 3 to 5 mm polyps in the ascending colon,                            removed with a cold snare. Resected and retrieved.                           - Three 3 to 5 mm polyps in the sigmoid colon and                            in the descending colon, removed with a cold snare.                            Resected and retrieved.                           - Patchy moderate inflammation was found in the                            sigmoid colon, in the descending colon, in the                            transverse colon, at the hepatic flexure, in the                            ascending colon and in the cecum. Endoscopic                            appearance seems most consistent with Crohn's                            disease. There was inflammation of the IC valve,  but the ileum otherwise appeared normal. This was                            extensively biopsied. As this is otherwise                             asymptomatic, will await biopsies and labs as below                            before initiating treatment.                           - One 4 mm polyp in the rectum, removed with a cold                            snare. Resected and retrieved.                           - Diverticulosis in the sigmoid colon.                           - Normal mucosa in the rectum.                           - Non-bleeding internal hemorrhoids.                           - The examined portion of the ileum was normal. Recommendation:           - Patient has a contact number available for                            emergencies. The signs and symptoms of potential                            delayed complications were discussed with the                            patient. Return to normal activities tomorrow.                            Written discharge instructions were provided to the                            patient.                           - Resume previous diet.                           - Continue present medications.                           - Await pathology results.                           -  Repeat colonoscopy for surveillance based on                            pathology results.                           - Check ESR, CRP, fecal calprotectin.                           - Return to GI clinic at appointment to be                            scheduled. Gerrit Heck, MD 01/30/2021 8:42:18 AM

## 2021-01-30 NOTE — Progress Notes (Signed)
Report given to PACU, vss 

## 2021-02-02 ENCOUNTER — Telehealth: Payer: Self-pay

## 2021-02-02 NOTE — Telephone Encounter (Signed)
Per 01/30/21 procedure report - Return to GI clinic at appt to be scheduled  Patient has been scheduled for a follow up with Dr. Bryan Lemma on Tuesday, 03/10/21 at 10:20 am. Left detailed vm with patient's appt information, advised that this appt is at the [facility] [place] office. Advised pt to call back at his earliest convenience if this day or time does not work for him.

## 2021-02-03 ENCOUNTER — Telehealth: Payer: Self-pay

## 2021-02-03 NOTE — Telephone Encounter (Signed)
  Follow up Call-  Call back number 01/30/2021  Post procedure Call Back phone  # [phone]  Permission to leave phone message Yes  Some recent data might be hidden     Patient questions:  Do you have a fever, pain , or abdominal swelling? No. Pain Score  0 *  Have you tolerated food without any problems? Yes.    Have you been able to return to your normal activities? Yes.    Do you have any questions about your discharge instructions: Diet   No. Medications  No. Follow up visit  No.  Do you have questions or concerns about your Care? No.  Actions: * If pain score is 4 or above: No action needed, pain <4.

## 2021-02-12 ENCOUNTER — Telehealth: Payer: Self-pay | Admitting: General Surgery

## 2021-02-12 NOTE — Telephone Encounter (Signed)
-----  Message from [place], DO sent at 02/11/2021  9:59 AM EST ----- The biopsies taken during the recent colonoscopy are notable for the following: - The polyp removed from the rectum was a benign Hyperplastic Polyp.  This type of polyp harbors no malignant potential. 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  These types of polyps are considered benign, but precancerous.  These were removed entirely at the time of colonoscopy. - The biopsies taken from the colon demonstrate patchy, active colitis (inflammation), but there were no features of chronic inflammatory change.  Without chronic inflammatory change, this should be more consistent with infection, drug effect, or another recent acute insult rather than Inflammatory Bowel Disease.  As the inflammatory markers were otherwise normal (ESR and CRP) and the patient is otherwise asymptomatic, I recommend follow-up in the GI clinic to discuss these results in greater detail prior to initiating any new therapy.  Colonoscopy recall in 3 years for ongoing polyp surveillance given the number of adenomatous polyps.

## 2021-02-12 NOTE — Telephone Encounter (Signed)
LVM that I would send a mychart message regarding results to colonoscopy biopsies and recommendations. Patient told to call with any questions.

## 2021-03-10 ENCOUNTER — Other Ambulatory Visit: Payer: Self-pay

## 2021-03-10 ENCOUNTER — Encounter: Payer: Self-pay | Admitting: Gastroenterology

## 2021-03-10 ENCOUNTER — Ambulatory Visit (INDEPENDENT_AMBULATORY_CARE_PROVIDER_SITE_OTHER): Payer: BC Managed Care – PPO | Admitting: Gastroenterology

## 2021-03-10 VITALS — BP 140/78 | HR 94 | Ht 70.5 in | Wt 205.2 lb

## 2021-03-10 DIAGNOSIS — Z800 Family history of malignant neoplasm of digestive organs: Secondary | ICD-10-CM

## 2021-03-10 DIAGNOSIS — D369 Benign neoplasm, unspecified site: Secondary | ICD-10-CM

## 2021-03-10 NOTE — Progress Notes (Signed)
Chief Complaint:    Post procedure follow-up, colon polyps, family history of colon cancer, colitis on colonoscopy  GI History: 50 year old male with a history of diverticulosis (with acute diverticulitis in 12/2015), diabetes, hyperlipidemia. Family history notable for father with colon cancer at age  <23 and paternal uncle with colon cancer.  GI history as below:  - 01/30/2021: Colonoscopy for initial screening: 5 subcentimeter tubular adenomas, rectal polyp (path: HP), sigmoid diverticulosis.  Patchy moderate inflammation with aphthous ulcers in the right colon and ICV with mild inflammatory changes in the left colon (path: Active colitis without chronic inflammatory changes).  Normal rectum.  Grade 1 internal hemorrhoids.  Normal TI - 01/2021: Normal ESR and CRP.  Fecal calprotectin not completed - No GI symptoms at all  HPI:     Patient is a 50 y.o. male presenting to the Gastroenterology Clinic for postprocedure follow-up.   Index colonoscopy completed 01/2021 as outlined above which was notable for 5 tubular adenomas along with active inflammatory changes right > left colon.  Path demonstrates acute, active colitis without chronic inflammatory changes.  Inflammatory markers were otherwise normal.  Was not started on any IBD type therapy as he was otherwise asymptomatic.  Today, he states he feels well and is without any GI symptoms.  Tolerating all p.o. intake without any issues.  No changes in bowel habits.  No hematochezia or melena.  Otherwise, no new labs or abdominal imaging for review.  Review of systems:     No chest pain, no SOB, no fevers, no urinary sx   Past Medical History:  Diagnosis Date   Arthritis    Diabetes mellitus type II    DIABETES MELLITUS, TYPE II 08/18/2009   on meds   Hay fever    with allergies   Hyperlipidemia    HYPERLIPIDEMIA 08/18/2009   on meds   Seasonal allergies     Patient's surgical history, family medical history, social history,  medications and allergies were all reviewed in Epic    Current Outpatient Medications  Medication Sig Dispense Refill   empagliflozin (JARDIANCE) 25 MG TABS tablet Take 1 tablet (25 mg total) by mouth daily before breakfast. 30 tablet 11   glimepiride (AMARYL) 4 MG tablet TAKE 1 TABLET BY MOUTH EVERY DAY BEFORE BREAKFAST 90 tablet 2   metFORMIN (GLUCOPHAGE) 500 MG tablet TAKE 2 TABLETS (1,000 MG TOTAL) BY MOUTH 2 (TWO) TIMES DAILY WITH A MEAL. 360 tablet 2   Multiple Vitamins-Minerals (MULTIVITAMIN) LIQD Take 1 tablet by mouth daily at 6 (six) AM.     sildenafil (VIAGRA) 100 MG tablet Take 0.5-1 tablets (50-100 mg total) by mouth daily as needed for erectile dysfunction. 10 tablet 11   simvastatin (ZOCOR) 80 MG tablet TAKE 1/2 TABLET BY MOUTH EVERY DAY 45 tablet 3   TRULICITY 1.5 AJ/2.8NO SOPN INJECT 1 PEN INTO THE SKIN ONCE A WEEK. 1.5 mL 7   No current facility-administered medications for this visit.    Physical Exam:     BP 140/78   Pulse 94   Ht 5' 10.5" (1.791 m)   Wt 205 lb 4 oz (93.1 kg)   BMI 29.03 kg/m   GENERAL:  Pleasant male in NAD PSYCH: : Cooperative, normal affect Musculoskeletal:  Normal muscle tone, normal strength NEURO: Alert and oriented x 3, no focal neurologic deficits   IMPRESSION and PLAN:    1) Family history of colon cancer 2) Personal history of Tubular Adenomas - Repeat colonoscopy in 3 years (2025) for  ongoing polyp surveillance and colon cancer screening  3) Colitis on index colonoscopy - Index colonoscopy with incidentally noted active, acute inflammation without chronic inflammatory changes.  Patient otherwise asymptomatic and normal ESR/CRP.  Based on lack of symptoms, normal inflammatory markers, no plan for initiating new therapy at this juncture - Patient to call/follow-up if develops GI symptoms that warrant additional evaluation  RTC prn     I spent 20 minutes of time, including independent review of results as outlined above,  communicating results with the patient directly, face-to-face time with the patient, coordinating care, ordering studies and medications as appropriate, and documentation.        Lavena Bullion ,DO, FACG 03/10/2021, 10:19 AM

## 2021-03-10 NOTE — Patient Instructions (Signed)
If you are age 49 or older, your body mass index should be between 23-30. Your Body mass index is 29.03 kg/m. If this is out of the aforementioned range listed, please consider follow up with your Primary Care Provider.  If you are age 57 or younger, your body mass index should be between 19-25. Your Body mass index is 29.03 kg/m. If this is out of the aformentioned range listed, please consider follow up with your Primary Care Provider.   __________________________________________________________  The [facility] providers would like to encourage you to use [facility] to communicate with providers for non-urgent requests or questions.  Due to long hold times on the telephone, sending your provider a message by [facility] may be a faster and more efficient way to get a response.  Please allow 48 business hours for a response.  Please remember that this is for non-urgent requests.  ________________________________________________________  We put a recall in our system to contact you for a repeat colonoscopy in 3 years.   Follow up as needed.  Thank you for choosing me and [facility].  Vito Cirigliano, D.O.

## 2021-03-15 ENCOUNTER — Other Ambulatory Visit: Payer: Self-pay | Admitting: Family Medicine

## 2021-03-24 ENCOUNTER — Other Ambulatory Visit: Payer: Self-pay | Admitting: Family Medicine

## 2021-04-10 ENCOUNTER — Ambulatory Visit (INDEPENDENT_AMBULATORY_CARE_PROVIDER_SITE_OTHER): Payer: BC Managed Care – PPO | Admitting: Family Medicine

## 2021-04-10 VITALS — BP 124/78 | HR 98 | Temp 98.6°F | Wt 205.5 lb

## 2021-04-10 DIAGNOSIS — Z9114 Patient's other noncompliance with medication regimen: Secondary | ICD-10-CM | POA: Diagnosis not present

## 2021-04-10 DIAGNOSIS — E1165 Type 2 diabetes mellitus with hyperglycemia: Secondary | ICD-10-CM | POA: Diagnosis not present

## 2021-04-10 LAB — POCT GLYCOSYLATED HEMOGLOBIN (HGB A1C): Hemoglobin A1C: 8.4 % — AB (ref 4.0–5.6)

## 2021-04-10 MED ORDER — TRULICITY 3 MG/0.5ML ~~LOC~~ SOAJ: 3.0000 mg | SUBCUTANEOUS | 11 refills | Status: DC

## 2021-04-10 NOTE — Patient Instructions (Signed)
Try to step compliance with nighttime dosing of metformin  Lets go ahead and bump the Trulicity up to 3 mg subcutaneous once weekly  Keep sugar and white starch intake down  Let us plan on 62-month follow-up and hopefully will see some improvements.  A1c today was 8.4% and our goal is less than 7.

## 2021-04-10 NOTE — Progress Notes (Signed)
Established Patient Office Visit  Subjective:  Patient ID: Richard Carroll, male    DOB: [birth year]  Age: 51 y.o. MRN: [id number]  CC:  Chief Complaint  Patient presents with   Follow-up    HPI JAQUIL TODT presents for follow-up regarding type 2 diabetes.  He is on combination therapy with glimepiride, Trulicity, metformin.  Also takes [place].  We recently increased his Jardiance to 25 mg daily.  His last A1c was 8.0%.  He was having compliance with his nighttime dose of metformin then and still continues to skip doses he estimates about 50% of time.  He is fairly compliant with morning doses.  He states he has been compliant with other medications.  His recent lipids were stable.  Urine microalbumin screen negative.  Kidney function stable.  He stays very busy still working 2 jobs.  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  Past Medical History:  Diagnosis Date   Arthritis    Diabetes mellitus type II    DIABETES MELLITUS, TYPE II 08/18/2009   on meds   Hay fever    with allergies   Hyperlipidemia    HYPERLIPIDEMIA 08/18/2009   on meds   Seasonal allergies     Past Surgical History:  Procedure Laterality Date   FINGER SURGERY Right    ring finger   KNEE SURGERY Right 2005    Family History  Problem Relation Age of Onset   Hyperlipidemia Mother    Stroke Mother    Hypertension Mother    Diabetes Mother    Prostate cancer Father 58       2004   Colon cancer Father 58       2013   Heart disease Father    Diabetes Maternal Grandmother    Alcohol abuse Maternal Grandfather    Colon cancer Paternal Uncle    Prostate cancer Paternal Uncle    Colon polyps Neg Hx    Esophageal cancer Neg Hx    Stomach cancer Neg Hx    Rectal cancer Neg Hx     Social History   Socioeconomic History   Marital status: Married    Spouse name: Not on file    Number of children: Not on file   Years of education: Not on file   Highest education level: Not on file  Occupational History   Not on file  Tobacco Use   Smoking status: Never   Smokeless tobacco: Current    Types: Chew  Vaping Use   Vaping Use: Never used  Substance and Sexual Activity   Alcohol use: Not Currently   Drug use: No   Sexual activity: Not on file  Other Topics Concern   Not on file  Social History Narrative   Not on file   Social Determinants of Health   Financial Resource Strain: Not on file  Food Insecurity: Not on file  Transportation Needs: Not on file  Physical Activity: Not on file  Stress: Not on file  Social Connections: Not on file  Intimate Partner Violence: Not on file    Outpatient Medications Prior to Visit  Medication Sig Dispense Refill   empagliflozin (JARDIANCE) 25 MG TABS tablet Take 1 tablet (25 mg total) by mouth daily before breakfast. 30 tablet 11   glimepiride (AMARYL) 4 MG tablet TAKE 1 TABLET BY MOUTH EVERY DAY  BEFORE BREAKFAST 90 tablet 2   metFORMIN (GLUCOPHAGE) 500 MG tablet TAKE 2 TABLETS (1,000 MG TOTAL) BY MOUTH 2 (TWO) TIMES DAILY WITH A MEAL. 360 tablet 2   Multiple Vitamins-Minerals (MULTIVITAMIN) LIQD Take 1 tablet by mouth daily at 6 (six) AM.     sildenafil (VIAGRA) 100 MG tablet Take 0.5-1 tablets (50-100 mg total) by mouth daily as needed for erectile dysfunction. 10 tablet 11   simvastatin (ZOCOR) 80 MG tablet TAKE 1/2 TABLET BY MOUTH EVERY DAY 45 tablet 3   TRULICITY 1.5 OE/4.2PN SOPN INJECT 1 PEN INTO THE SKIN ONCE A WEEK. 2 mL 7   No facility-administered medications prior to visit.    No Known Allergies  ROS Review of Systems  Respiratory:  Negative for shortness of breath.   Cardiovascular:  Negative for chest pain.  Endocrine: Negative for polydipsia and polyuria.     Objective:    Physical Exam Vitals reviewed.  Constitutional:      Appearance: Normal appearance.  Cardiovascular:     Rate and  Rhythm: Normal rate and regular rhythm.  Pulmonary:     Effort: Pulmonary effort is normal.     Breath sounds: Normal breath sounds. No wheezing or rales.  Neurological:     Mental Status: He is alert.    BP 124/78 (BP Location: Left Arm, Patient Position: Sitting, Cuff Size: Normal)    Pulse 98    Temp 98.6 F (37 C) (Oral)    Wt 205 lb 8 oz (93.2 kg)    SpO2 98%    BMI 29.07 kg/m  Wt Readings from Last 3 Encounters:  04/10/21 205 lb 8 oz (93.2 kg)  03/10/21 205 lb 4 oz (93.1 kg)  01/30/21 204 lb (92.5 kg)     Health Maintenance Due  Topic Date Due   HIV Screening  Never done   Hepatitis C Screening  Never done   FOOT EXAM  10/20/2019    There are no preventive care reminders to display for this patient.  No results found for: TSH Lab Results  Component Value Date   WBC 14.2 (H) 12/15/2015   HGB 15.7 12/15/2015   HCT 45.5 12/15/2015   MCV 88.9 12/15/2015   PLT 243 12/15/2015   Lab Results  Component Value Date   NA 138 01/07/2021   K 4.7 01/07/2021   CO2 29 01/07/2021   GLUCOSE 129 (H) 01/07/2021   BUN 15 01/07/2021   CREATININE 0.96 01/07/2021   BILITOT 0.5 01/07/2021   ALKPHOS 54 01/07/2021   AST 13 01/07/2021   ALT 17 01/07/2021   PROT 6.6 01/07/2021   ALBUMIN 4.4 01/07/2021   CALCIUM 9.7 01/07/2021   ANIONGAP 8 12/15/2015   GFR 93.22 01/07/2021   Lab Results  Component Value Date   CHOL 160 01/07/2021   Lab Results  Component Value Date   HDL 52.20 01/07/2021   Lab Results  Component Value Date   LDLCALC 90 01/07/2021   Lab Results  Component Value Date   TRIG 90.0 01/07/2021   Lab Results  Component Value Date   CHOLHDL 3 01/07/2021   Lab Results  Component Value Date   HGBA1C 8.4 (A) 04/10/2021      Assessment & Plan:   Type 2 diabetes with worsening control with A1c today 8.4%.  Poor compliance with nighttime use of metformin.  We suggested the following  -Step up compliance with nighttime dosing of metformin -Titrate  Trulicity to 3 mg subcutaneous once weekly -Continue current  doses of Jardiance and Amaryl as well as metformin -Recommend 52-month follow-up.   Meds ordered this encounter  Medications   Dulaglutide (TRULICITY) 3 UJ/8.1XB SOPN    Sig: Inject 3 mg into the skin once a week.    Dispense:  2 mL    Refill:  11    Follow-up: Return in about 3 months (around 07/09/2021).    Carolann Littler, MD

## 2021-05-16 ENCOUNTER — Other Ambulatory Visit: Payer: Self-pay | Admitting: Family Medicine

## 2021-06-23 DIAGNOSIS — D225 Melanocytic nevi of trunk: Secondary | ICD-10-CM | POA: Diagnosis not present

## 2021-06-23 DIAGNOSIS — L308 Other specified dermatitis: Secondary | ICD-10-CM | POA: Diagnosis not present

## 2021-06-23 DIAGNOSIS — L989 Disorder of the skin and subcutaneous tissue, unspecified: Secondary | ICD-10-CM | POA: Diagnosis not present

## 2021-06-23 DIAGNOSIS — D485 Neoplasm of uncertain behavior of skin: Secondary | ICD-10-CM | POA: Diagnosis not present

## 2021-06-23 DIAGNOSIS — L2089 Other atopic dermatitis: Secondary | ICD-10-CM | POA: Diagnosis not present

## 2021-07-07 ENCOUNTER — Encounter: Payer: Self-pay | Admitting: Family Medicine

## 2021-07-07 ENCOUNTER — Ambulatory Visit (INDEPENDENT_AMBULATORY_CARE_PROVIDER_SITE_OTHER): Payer: BC Managed Care – PPO | Admitting: Family Medicine

## 2021-07-07 VITALS — BP 124/70 | HR 90 | Temp 98.0°F | Ht 70.5 in | Wt 195.2 lb

## 2021-07-07 DIAGNOSIS — E1165 Type 2 diabetes mellitus with hyperglycemia: Secondary | ICD-10-CM | POA: Diagnosis not present

## 2021-07-07 LAB — POCT GLYCOSYLATED HEMOGLOBIN (HGB A1C): Hemoglobin A1C: 7.3 % — AB (ref 4.0–5.6)

## 2021-07-07 NOTE — Progress Notes (Signed)
? ?Established Patient Office Visit ? ?Subjective:  ?Patient ID: Richard Carroll, male    DOB: 02-23-1971  Age: 51 y.o. MRN: [id number] ? ?CC:  ?Chief Complaint  ?Patient presents with  ? Follow-up  ? ? ?HPI ?Brain Hilts presents for follow-up type 2 diabetes.  Last visit his A1c was 8.4%.  We increased his Trulicity to 3 mg subcutaneous weekly.  He remains on glimepiride and Jardiance and metformin as well.  He has stepped up his compliance with metformin though he still occasionally misses a dose.  He has lost 10 pounds since January.  Is trying to eat better.  A1c improved today to 7.3%.  No recent hypoglycemic symptoms.  No consistent exercise. ? ?Past Medical History:  ?Diagnosis Date  ? Arthritis   ? Diabetes mellitus type II   ? DIABETES MELLITUS, TYPE II 08/18/2009  ? on meds  ? Hay fever   ? with allergies  ? Hyperlipidemia   ? HYPERLIPIDEMIA 08/18/2009  ? on meds  ? Seasonal allergies   ? ? ?Past Surgical History:  ?Procedure Laterality Date  ? FINGER SURGERY Right   ? ring finger  ? KNEE SURGERY Right 2005  ? ? ?Family History  ?Problem Relation Age of Onset  ? Hyperlipidemia Mother   ? Stroke Mother   ? Hypertension Mother   ? Diabetes Mother   ? Prostate cancer Father 47  ?     2004  ? Colon cancer Father 37  ?     2013  ? Heart disease Father   ? Diabetes Maternal Grandmother   ? Alcohol abuse Maternal Grandfather   ? Colon cancer Paternal Uncle   ? Prostate cancer Paternal Uncle   ? Colon polyps Neg Hx   ? Esophageal cancer Neg Hx   ? Stomach cancer Neg Hx   ? Rectal cancer Neg Hx   ? ? ?Social History  ? ?Socioeconomic History  ? Marital status: Married  ?  Spouse name: Not on file  ? Number of children: Not on file  ? Years of education: Not on file  ? Highest education level: Not on file  ?Occupational History  ? Not on file  ?Tobacco Use  ? Smoking status: Never  ? Smokeless tobacco: Current  ?  Types: Chew  ?Vaping Use  ? Vaping Use: Never used  ?Substance and Sexual Activity  ? Alcohol  use: Not Currently  ? Drug use: No  ? Sexual activity: Not on file  ?Other Topics Concern  ? Not on file  ?Social History Narrative  ? Not on file  ? ?Social Determinants of Health  ? ?Financial Resource Strain: Not on file  ?Food Insecurity: Not on file  ?Transportation Needs: Not on file  ?Physical Activity: Not on file  ?Stress: Not on file  ?Social Connections: Not on file  ?Intimate Partner Violence: Not on file  ? ? ?Outpatient Medications Prior to Visit  ?Medication Sig Dispense Refill  ? Dulaglutide (TRULICITY) 3 PJ/8.2NK SOPN Inject 3 mg into the skin once a week. 2 mL 11  ? empagliflozin (JARDIANCE) 25 MG TABS tablet Take 1 tablet (25 mg total) by mouth daily before breakfast. 30 tablet 11  ? glimepiride (AMARYL) 4 MG tablet TAKE 1 TABLET BY MOUTH EVERY DAY BEFORE BREAKFAST 90 tablet 2  ? metFORMIN (GLUCOPHAGE) 500 MG tablet TAKE 2 TABLETS (1,000 MG TOTAL) BY MOUTH 2 (TWO) TIMES DAILY WITH A MEAL. 360 tablet 2  ? Multiple Vitamins-Minerals (MULTIVITAMIN) LIQD Take  1 tablet by mouth daily at 6 (six) AM.    ? sildenafil (VIAGRA) 100 MG tablet Take 0.5-1 tablets (50-100 mg total) by mouth daily as needed for erectile dysfunction. 10 tablet 11  ? simvastatin (ZOCOR) 80 MG tablet TAKE 1/2 TABLET BY MOUTH EVERY DAY 45 tablet 3  ? ?No facility-administered medications prior to visit.  ? ? ?No Known Allergies ? ?ROS ?Review of Systems  ?Constitutional:  Negative for fatigue.  ?Eyes:  Negative for visual disturbance.  ?Respiratory:  Negative for cough, chest tightness and shortness of breath.   ?Cardiovascular:  Negative for chest pain, palpitations and leg swelling.  ?Endocrine: Negative for polydipsia and polyuria.  ?Neurological:  Negative for dizziness, syncope, weakness, light-headedness and headaches.  ? ?  ?Objective:  ?  ?Physical Exam ?Vitals reviewed.  ?Constitutional:   ?   Appearance: Normal appearance.  ?Cardiovascular:  ?   Rate and Rhythm: Normal rate and regular rhythm.  ?Pulmonary:  ?   Effort:  Pulmonary effort is normal.  ?   Breath sounds: Normal breath sounds.  ?Neurological:  ?   Mental Status: He is alert.  ? ? ?BP 124/70 (BP Location: Left Arm, Patient Position: Sitting, Cuff Size: Normal)   Pulse 90   Temp 98 ?F (36.7 ?C) (Oral)   Ht 5' 10.5" (1.791 m)   Wt 195 lb 3.2 oz (88.5 kg)   SpO2 99%   BMI 27.61 kg/m?  ?Wt Readings from Last 3 Encounters:  ?07/07/21 195 lb 3.2 oz (88.5 kg)  ?04/10/21 205 lb 8 oz (93.2 kg)  ?03/10/21 205 lb 4 oz (93.1 kg)  ? ? ? ?Health Maintenance Due  ?Topic Date Due  ? HIV Screening  Never done  ? Hepatitis C Screening  Never done  ? FOOT EXAM  10/20/2019  ? ? ?There are no preventive care reminders to display for this patient. ? ?No results found for: TSH ?Lab Results  ?Component Value Date  ? WBC 14.2 (H) 12/15/2015  ? HGB 15.7 12/15/2015  ? HCT 45.5 12/15/2015  ? MCV 88.9 12/15/2015  ? PLT 243 12/15/2015  ? ?Lab Results  ?Component Value Date  ? NA 138 01/07/2021  ? K 4.7 01/07/2021  ? CO2 29 01/07/2021  ? GLUCOSE 129 (H) 01/07/2021  ? BUN 15 01/07/2021  ? CREATININE 0.96 01/07/2021  ? BILITOT 0.5 01/07/2021  ? ALKPHOS 54 01/07/2021  ? AST 13 01/07/2021  ? ALT 17 01/07/2021  ? PROT 6.6 01/07/2021  ? ALBUMIN 4.4 01/07/2021  ? CALCIUM 9.7 01/07/2021  ? ANIONGAP 8 12/15/2015  ? GFR 93.22 01/07/2021  ? ?Lab Results  ?Component Value Date  ? CHOL 160 01/07/2021  ? ?Lab Results  ?Component Value Date  ? HDL 52.20 01/07/2021  ? ?Lab Results  ?Component Value Date  ? [place] 90 01/07/2021  ? ?Lab Results  ?Component Value Date  ? TRIG 90.0 01/07/2021  ? ?Lab Results  ?Component Value Date  ? CHOLHDL 3 01/07/2021  ? ?Lab Results  ?Component Value Date  ? HGBA1C 7.3 (A) 07/07/2021  ? ? ?  ?Assessment & Plan:  ? ?Problem List Items Addressed This Visit   ? ?  ? Unprioritized  ? Type 2 diabetes mellitus, uncontrolled ([place]) - Primary  ? Relevant Orders  ? POCT glycosylated hemoglobin (Hb A1C) (Completed)  ?Type 2 diabetes improved from 8.4 to 7.3% with recent increase in  Trulicity and increased compliance with metformin.  Continue weight loss efforts and lifestyle modification.  Set up  follow-up in 3 to 6 months. ? ?No orders of the defined types were placed in this encounter. ? ? ?Follow-up: Return in about 3 months (around 10/06/2021).  ? ? ?Carolann Littler, MD ?

## 2021-07-07 NOTE — Patient Instructions (Signed)
A1C improved to 7.3%.      keep up the good work.   ?

## 2021-12-27 ENCOUNTER — Other Ambulatory Visit: Payer: Self-pay | Admitting: Family Medicine

## 2022-01-05 ENCOUNTER — Encounter: Payer: Self-pay | Admitting: Family Medicine

## 2022-01-05 ENCOUNTER — Ambulatory Visit (INDEPENDENT_AMBULATORY_CARE_PROVIDER_SITE_OTHER): Payer: BC Managed Care – PPO | Admitting: Family Medicine

## 2022-01-05 VITALS — BP 120/72 | HR 88 | Temp 97.9°F | Ht 70.5 in | Wt 200.7 lb

## 2022-01-05 DIAGNOSIS — E7849 Other hyperlipidemia: Secondary | ICD-10-CM

## 2022-01-05 DIAGNOSIS — E1165 Type 2 diabetes mellitus with hyperglycemia: Secondary | ICD-10-CM

## 2022-01-05 LAB — HEPATIC FUNCTION PANEL
ALT: 21 U/L (ref 0–53)
AST: 16 U/L (ref 0–37)
Albumin: 4.4 g/dL (ref 3.5–5.2)
Alkaline Phosphatase: 53 U/L (ref 39–117)
Bilirubin, Direct: 0.1 mg/dL (ref 0.0–0.3)
Total Bilirubin: 0.5 mg/dL (ref 0.2–1.2)
Total Protein: 7 g/dL (ref 6.0–8.3)

## 2022-01-05 LAB — BASIC METABOLIC PANEL
BUN: 18 mg/dL (ref 6–23)
CO2: 28 mEq/L (ref 19–32)
Calcium: 9.9 mg/dL (ref 8.4–10.5)
Chloride: 104 mEq/L (ref 96–112)
Creatinine, Ser: 0.96 mg/dL (ref 0.40–1.50)
GFR: 92.57 mL/min (ref >60.00)
Glucose, Bld: 184 mg/dL — ABNORMAL HIGH (ref 70–99)
Potassium: 4.4 mEq/L (ref 3.5–5.1)
Sodium: 140 mEq/L (ref 135–145)

## 2022-01-05 LAB — LIPID PANEL
Cholesterol: 142 mg/dL (ref 0–200)
HDL: 47.7 mg/dL (ref >39.00)
LDL Cholesterol: 73 mg/dL (ref 0–99)
NonHDL: 94.5
Total CHOL/HDL Ratio: 3
Triglycerides: 106 mg/dL (ref 0.0–149.0)
VLDL: 21.2 mg/dL (ref 0.0–40.0)

## 2022-01-05 LAB — MICROALBUMIN / CREATININE URINE RATIO
Creatinine,U: 51.8 mg/dL
Microalb Creat Ratio: 1.4 mg/g (ref 0.0–30.0)
Microalb, Ur: <0.7 mg/dL (ref 0.0–1.9)

## 2022-01-05 LAB — POCT GLYCOSYLATED HEMOGLOBIN (HGB A1C): Hemoglobin A1C: 7.8 % — AB (ref 4.0–5.6)

## 2022-01-05 MED ORDER — TRULICITY 4.5 MG/0.5ML ~~LOC~~ SOAJ: 4.5000 mg | SUBCUTANEOUS | 11 refills | Status: DC

## 2022-01-05 NOTE — Patient Instructions (Signed)
Let's go ahead and increase the Trulicity to 4.5 mg once weekly  Set up diabetic eye exam  Set up 3 month follow up.

## 2022-01-05 NOTE — Progress Notes (Signed)
Established Patient Office Visit  Subjective   Patient ID: Richard Carroll, male    DOB: 1970-07-14  Age: 51 y.o. MRN: [id number]  No chief complaint on file.   HPI   Richard Carroll is seen for follow-up regarding type 2 diabetes and hyperlipidemia.  Lipids were last checked a year ago.  He is currently on Trulicity 3 mg subcutaneous once weekly but has missed couple doses recently.  He also states he has missed occasional doses of metformin.  He remains also on Jardiance and glimepiride.  No recent hypoglycemic symptoms.  He has set up diabetic eye exam for December.  Denies any recent neuropathy symptoms.  His weight has gone up about 5 pounds from last visit 6 months ago  Remains on simvastatin 80 mg daily for lipids.  He has been on this for several years.  No myalgias.  He remains extremely busy working 2 jobs and frequently over 14 hours/day.  He declines flu vaccine  Past Medical History:  Diagnosis Date   Arthritis    Diabetes mellitus type II    DIABETES MELLITUS, TYPE II 08/18/2009   on meds   Hay fever    with allergies   Hyperlipidemia    HYPERLIPIDEMIA 08/18/2009   on meds   Seasonal allergies    Past Surgical History:  Procedure Laterality Date   FINGER SURGERY Right    ring finger   KNEE SURGERY Right 2005    reports that he has never smoked. His smokeless tobacco use includes chew. He reports that he does not currently use alcohol. He reports that he does not use drugs. family history includes Alcohol abuse in his maternal grandfather; Colon cancer in his paternal uncle; Colon cancer (age of onset: 18) in his father; Diabetes in his maternal grandmother and mother; Heart disease in his father; Hyperlipidemia in his mother; Hypertension in his mother; Prostate cancer in his paternal uncle; Prostate cancer (age of onset: 77) in his father; Stroke in his mother. No Known Allergies  Review of Systems  Constitutional:  Negative for malaise/fatigue.  Eyes:  Negative for  blurred vision.  Respiratory:  Negative for shortness of breath.   Cardiovascular:  Negative for chest pain.  Neurological:  Negative for dizziness, weakness and headaches.      Objective:     BP 120/72 (BP Location: Left Arm, Patient Position: Sitting, Cuff Size: Normal)   Pulse 88   Temp 97.9 F (36.6 C) (Oral)   Ht 5' 10.5" (1.791 m)   Wt 200 lb 11.2 oz (91 kg)   SpO2 97%   BMI 28.39 kg/m  BP Readings from Last 3 Encounters:  01/05/22 120/72  07/07/21 124/70  04/10/21 124/78   Wt Readings from Last 3 Encounters:  01/05/22 200 lb 11.2 oz (91 kg)  07/07/21 195 lb 3.2 oz (88.5 kg)  04/10/21 205 lb 8 oz (93.2 kg)      Physical Exam Constitutional:      Appearance: He is well-developed.  HENT:     Right Ear: External ear normal.     Left Ear: External ear normal.  Eyes:     Pupils: Pupils are equal, round, and reactive to light.  Neck:     Thyroid: No thyromegaly.  Cardiovascular:     Rate and Rhythm: Normal rate and regular rhythm.  Pulmonary:     Effort: Pulmonary effort is normal. No respiratory distress.     Breath sounds: Normal breath sounds. No wheezing or rales.  Musculoskeletal:  Cervical back: Neck supple.  Skin:    Comments: Feet reveal no skin lesions. Good distal foot pulses. Good capillary refill. No calluses. Normal sensation with monofilament testing   Neurological:     Mental Status: He is alert and oriented to person, place, and time.      Results for orders placed or performed in visit on 01/05/22  POCT glycosylated hemoglobin (Hb A1C)  Result Value Ref Range   Hemoglobin A1C 7.8 (A) 4.0 - 5.6 %   HbA1c POC (<> result, manual entry)     HbA1c, POC (prediabetic range)     HbA1c, POC (controlled diabetic range)        The 10-year ASCVD risk score (Arnett DK, et al., 2019) is: 3.7%    Assessment & Plan:   #1 type 2 diabetes.  Slightly suboptimal control with A1c 7.8% today.  We discussed stepping up compliance with his  metformin.  Go ahead and increase Trulicity to 4.5 mg subcutaneous once weekly.  Follow through with diabetic eye exam.  Check urine microalbumin screen today.  Taken 3 months.  If A1c not to goal at that point consider change from Trulicity to Ozempic-if we can get this covered through insurance  #2 hyperlipidemia.  Treated with high-dose simvastatin.  Recheck lipid and hepatic panel   Return in about 3 months (around 04/07/2022).    Carolann Littler, MD

## 2022-01-16 ENCOUNTER — Other Ambulatory Visit: Payer: Self-pay | Admitting: Family Medicine

## 2022-02-07 ENCOUNTER — Other Ambulatory Visit: Payer: Self-pay | Admitting: Family Medicine

## 2022-03-04 ENCOUNTER — Other Ambulatory Visit: Payer: Self-pay

## 2022-03-04 ENCOUNTER — Encounter (HOSPITAL_BASED_OUTPATIENT_CLINIC_OR_DEPARTMENT_OTHER): Payer: Self-pay | Admitting: Emergency Medicine

## 2022-03-04 ENCOUNTER — Emergency Department (HOSPITAL_BASED_OUTPATIENT_CLINIC_OR_DEPARTMENT_OTHER)
Admission: EM | Admit: 2022-03-04 | Discharge: 2022-03-04 | Disposition: A | Discharge status: Home / Self Care | Payer: BC Managed Care – PPO | Attending: Emergency Medicine | Admitting: Emergency Medicine

## 2022-03-04 DIAGNOSIS — F17220 Nicotine dependence, chewing tobacco, uncomplicated: Secondary | ICD-10-CM | POA: Diagnosis not present

## 2022-03-04 DIAGNOSIS — E119 Type 2 diabetes mellitus without complications: Secondary | ICD-10-CM | POA: Insufficient documentation

## 2022-03-04 DIAGNOSIS — S39012A Strain of muscle, fascia and tendon of lower back, initial encounter: Secondary | ICD-10-CM | POA: Diagnosis not present

## 2022-03-04 DIAGNOSIS — X58XXXA Exposure to other specified factors, initial encounter: Secondary | ICD-10-CM | POA: Insufficient documentation

## 2022-03-04 DIAGNOSIS — S29012A Strain of muscle and tendon of back wall of thorax, initial encounter: Secondary | ICD-10-CM | POA: Insufficient documentation

## 2022-03-04 DIAGNOSIS — Z7984 Long term (current) use of oral hypoglycemic drugs: Secondary | ICD-10-CM | POA: Diagnosis not present

## 2022-03-04 DIAGNOSIS — S3992XA Unspecified injury of lower back, initial encounter: Secondary | ICD-10-CM | POA: Diagnosis not present

## 2022-03-04 MED ORDER — METHOCARBAMOL 500 MG PO TABS: 500.0000 mg | ORAL_TABLET | Freq: Two times a day (BID) | ORAL | 0 refills | Status: DC

## 2022-03-04 MED ORDER — LIDOCAINE 4 % EX PTCH: 1.0000 | MEDICATED_PATCH | CUTANEOUS | 0 refills | Status: DC

## 2022-03-04 MED ORDER — METHOCARBAMOL 500 MG PO TABS
1000.0000 mg | ORAL_TABLET | Freq: Once | ORAL | Status: AC
  Administered 2022-03-04: 1000 mg via ORAL
  Filled 2022-03-04: qty 2

## 2022-03-04 MED ORDER — ACETAMINOPHEN 500 MG PO TABS
1000.0000 mg | ORAL_TABLET | Freq: Once | ORAL | Status: AC
  Administered 2022-03-04: 1000 mg via ORAL
  Filled 2022-03-04: qty 2

## 2022-03-04 MED ORDER — KETOROLAC TROMETHAMINE 15 MG/ML IJ SOLN
15.0000 mg | Freq: Once | INTRAMUSCULAR | Status: AC
  Administered 2022-03-04: 15 mg via INTRAMUSCULAR
  Filled 2022-03-04: qty 1

## 2022-03-04 NOTE — Discharge Instructions (Addendum)
We evaluated you in the emergency department for your back pain.  Your examination did not show signs of any dangerous condition.  Your symptoms are likely caused by a muscle strain.  Please take Tylenol and Motrin for your symptoms at home.  You can take 1000 mg of Tylenol every 6 hours and 600 mg of ibuprofen every 6 hours as needed for your symptoms.  You can take these medicines together as needed, either at the same time, or alternating every 3 hours.  I have also prescribed you a muscle relaxant which you can take twice daily, as well as a lidocaine patch which you can apply to the area of pain once daily.  Please try some gentle back stretching and exercises.  We have attached some information about this.  You can also try lying on your back with a tennis ball under you in the area that hurts, and rolling up and down to help relieve tension.  Please follow-up with your primary doctor.  You may also benefit from physical therapy.  Please return if you develop any worsening symptoms, severe symptoms, fevers or chills, incontinence, numbness or tingling, weakness, trouble walking, or any other concerning symptoms.

## 2022-03-04 NOTE — ED Triage Notes (Signed)
Pt to ER with c/o lower back pain that started in early November.  Pt states pain has been intermittent and he has been able to control it with OTC tylenol and ibuprofen. States today the pain is worse and he is not able to tolerate.  States he has worn a back brace at work the last several days.  States pain is worse with movements and jarring.  No specific injury noted.

## 2022-03-04 NOTE — ED Provider Notes (Signed)
[facility]ERGENC[facility] DEPARTMENT Provider Note  CSN: [id number] Arrival date & time: 03/04/22 5397  Chief Complaint(s) Back Pain  HPI Richard Carroll is a 51 y.o. male with history of diabetes presenting to the emergency department with back pain for 2 weeks.  He reports that the pain is located in his left lower and mid back.  He has been taking Tylenol and ibuprofen which helps some but he continues to have pain.  He denies numbness, tingling, fevers, chills, night sweats, weight loss, bowel or bladder incontinence.  Denies any trauma to the back.  Does not remember any specific incident.   Past Medical History Past Medical History:  Diagnosis Date   Arthritis    Diabetes mellitus type II    DIABETES MELLITUS, TYPE II 08/18/2009   on meds   Hay fever    with allergies   Hyperlipidemia    HYPERLIPIDEMIA 08/18/2009   on meds   Seasonal allergies    Patient Active Problem List   Diagnosis Date Noted   Acute sinus infection 07/09/2014   Seborrheic dermatitis 06/16/2013   Obesity (BMI 30-39.9) 12/16/2012   Chronic eczema 12/15/2012   Allergic rhinitis 12/18/2010   Type 2 diabetes mellitus, uncontrolled ([place]) 08/18/2009   Hyperlipidemia 08/18/2009   Home Medication(s) Prior to Admission medications   Medication Sig Start Date End Date Taking? Authorizing Provider  lidocaine (HM LIDOCAINE PATCH) 4 % Place 1 patch onto the skin daily. 03/04/22  Yes Cristie Hem, MD  methocarbamol (ROBAXIN) 500 MG tablet Take 1 tablet (500 mg total) by mouth 2 (two) times daily. 03/04/22  Yes Cristie Hem, MD  Dulaglutide (TRULICITY) 4.5 QB/3.4LP SOPN Inject 4.5 mg as directed once a week. 01/05/22   Burchette, Alinda Sierras, MD  empagliflozin (JARDIANCE) 25 MG TABS tablet TAKE 1 TABLET BY MOUTH DAILY BEFORE BREAKFAST. 01/18/22   Burchette, Alinda Sierras, MD  glimepiride (AMARYL) 4 MG tablet TAKE 1 TABLET BY MOUTH EVERY DAY BEFORE BREAKFAST 12/29/21   Burchette, Alinda Sierras, MD   metFORMIN (GLUCOPHAGE) 500 MG tablet TAKE 2 TABLETS (1,000 MG TOTAL) BY MOUTH 2 (TWO) TIMES DAILY WITH A MEAL. 02/08/22   Burchette, Alinda Sierras, MD  Multiple Vitamins-Minerals (MULTIVITAMIN) LIQD Take 1 tablet by mouth daily at 6 (six) AM.    [provider]  sildenafil (VIAGRA) 100 MG tablet Take 0.5-1 tablets (50-100 mg total) by mouth daily as needed for erectile dysfunction. 01/19/19   Burchette, Alinda Sierras, MD  simvastatin (ZOCOR) 80 MG tablet TAKE 1/2 TABLET BY MOUTH EVERY DAY 05/18/21   Burchette, Alinda Sierras, MD                                                                                                                                    Past Surgical History Past Surgical History:  Procedure Laterality Date   FINGER SURGERY Right    ring finger   KNEE SURGERY Right  2005   Family History Family History  Problem Relation Age of Onset   Hyperlipidemia Mother    Stroke Mother    Hypertension Mother    Diabetes Mother    Prostate cancer Father 58       2004   Colon cancer Father 58       2013   Heart disease Father    Diabetes Maternal Grandmother    Alcohol abuse Maternal Grandfather    Colon cancer Paternal Uncle    Prostate cancer Paternal Uncle    Colon polyps Neg Hx    Esophageal cancer Neg Hx    Stomach cancer Neg Hx    Rectal cancer Neg Hx     Social History Social History   Tobacco Use   Smoking status: Never   Smokeless tobacco: Current    Types: Chew  Vaping Use   Vaping Use: Never used  Substance Use Topics   Alcohol use: Not Currently   Drug use: No   Allergies Patient has no known allergies.  Review of Systems Review of Systems  All other systems reviewed and are negative.   Physical Exam Vital Signs  I have reviewed the triage vital signs BP 137/88   Pulse 87   Temp 98.5 F (36.9 C)   Resp 18   Ht '5\' 10"'$  (1.778 m)   Wt 93 kg   SpO2 98%   BMI 29.41 kg/m  Physical Exam Vitals and nursing note reviewed.  Constitutional:       General: He is not in acute distress.    Appearance: Normal appearance.  HENT:     Head: Normocephalic and atraumatic.     Mouth/Throat:     Mouth: Mucous membranes are moist.  Eyes:     Conjunctiva/sclera: Conjunctivae normal.  Cardiovascular:     Rate and Rhythm: Normal rate and regular rhythm.  Pulmonary:     Effort: Pulmonary effort is normal. No respiratory distress.     Breath sounds: Normal breath sounds.  Abdominal:     General: Abdomen is flat.     Palpations: Abdomen is soft.     Tenderness: There is no abdominal tenderness. There is no right CVA tenderness or left CVA tenderness.  Musculoskeletal:     Right lower leg: No edema.     Left lower leg: No edema.     Comments: No midline C, T, L-spine tenderness.  Patient localizes pain to the left low thoracic/upper lumbar paraspinal muscles, no significant tenderness in this area  Skin:    General: Skin is warm and dry.     Capillary Refill: Capillary refill takes less than 2 seconds.  Neurological:     General: No focal deficit present.     Mental Status: He is alert and oriented to person, place, and time. Mental status is at baseline.     Comments: Strength 5 out of 5 in the bilateral lower extremities  Psychiatric:        Mood and Affect: Mood normal.        Behavior: Behavior normal.     ED Results and Treatments Labs (all labs ordered are listed, but only abnormal results are displayed) Labs Reviewed - No data to display  Radiology No results found.  Pertinent labs & imaging results that were available during my care of the patient were reviewed by me and considered in my medical decision making (see MDM for details).  Medications Ordered in ED Medications  ketorolac (TORADOL) 15 MG/ML injection 15 mg (has no administration in time range)  acetaminophen (TYLENOL) tablet 1,000 mg (has no  administration in time range)  methocarbamol (ROBAXIN) tablet 1,000 mg (has no administration in time range)                                                                                                                                     Procedures Procedures  (including critical care time)  Medical Decision Making / ED Course   MDM:  51 year old male presenting to the emergency department with back pain.  Exam reassuring.  No midline tenderness, normal strength.  Suspect low back sprain.  Discussed self-care for back pain.  Discussed self rehab exercises such as stretching and strengthening exercises.  Discussed home pain control.  Will prescribe muscle relaxant and lidocaine patches.  No red flags for spinal cord compression, occult fracture, occult infectious process.  No urinary symptoms or CVA tenderness to suggest urinary infection. Will discharge patient to home. All questions answered. Patient comfortable with plan of discharge. Return precautions discussed with patient and specified on the after visit summary.       Additional history obtained: -Additional history obtained from spouse -External records from outside source obtained and reviewed including: Chart review including previous notes, labs, imaging, consultation notes including office visit 01/05/22   Medicines ordered and prescription drug management: Meds ordered this encounter  Medications   ketorolac (TORADOL) 15 MG/ML injection 15 mg   acetaminophen (TYLENOL) tablet 1,000 mg   methocarbamol (ROBAXIN) tablet 1,000 mg   methocarbamol (ROBAXIN) 500 MG tablet    Sig: Take 1 tablet (500 mg total) by mouth 2 (two) times daily.    Dispense:  20 tablet    Refill:  0   lidocaine (HM LIDOCAINE PATCH) 4 %    Sig: Place 1 patch onto the skin daily.    Dispense:  15 patch    Refill:  0    -I have reviewed the patients home medicines and have made adjustments as needed  Social Determinants of Health:   Diagnosis or treatment significantly limited by social determinants of health: obesity   Reevaluation: After the interventions noted above, I reevaluated the patient and found that they have improved  Co morbidities that complicate the patient evaluation  Past Medical History:  Diagnosis Date   Arthritis    Diabetes mellitus type II    DIABETES MELLITUS, TYPE II 08/18/2009   on meds   Hay fever    with allergies   Hyperlipidemia    HYPERLIPIDEMIA 08/18/2009   on meds   Seasonal allergies       Dispostion: Disposition decision including need for  hospitalization was considered, and patient discharged from emergency department.    Final Clinical Impression(s) / ED Diagnoses Final diagnoses:  Back strain, initial encounter     This chart was dictated using voice recognition software.  Despite best efforts to proofread,  errors can occur which can change the documentation meaning.    Cristie Hem, MD 03/04/22 [phone]

## 2022-03-12 DIAGNOSIS — E119 Type 2 diabetes mellitus without complications: Secondary | ICD-10-CM | POA: Diagnosis not present

## 2022-03-12 LAB — HM DIABETES EYE EXAM

## 2022-03-15 ENCOUNTER — Encounter: Payer: Self-pay | Admitting: Family Medicine

## 2022-04-06 ENCOUNTER — Ambulatory Visit: Payer: BC Managed Care – PPO | Admitting: Family Medicine

## 2022-04-10 ENCOUNTER — Other Ambulatory Visit: Payer: Self-pay | Admitting: Family Medicine

## 2022-04-20 ENCOUNTER — Encounter: Payer: Self-pay | Admitting: Family Medicine

## 2022-04-20 ENCOUNTER — Ambulatory Visit (INDEPENDENT_AMBULATORY_CARE_PROVIDER_SITE_OTHER): Payer: BC Managed Care – PPO | Admitting: Family Medicine

## 2022-04-20 VITALS — BP 120/70 | HR 87 | Temp 98.9°F | Ht 70.0 in | Wt 204.5 lb

## 2022-04-20 DIAGNOSIS — E1165 Type 2 diabetes mellitus with hyperglycemia: Secondary | ICD-10-CM

## 2022-04-20 LAB — POCT GLYCOSYLATED HEMOGLOBIN (HGB A1C): Hemoglobin A1C: 7.1 % — AB (ref 4.0–5.6)

## 2022-04-20 NOTE — Progress Notes (Signed)
Established Patient Office Visit  Subjective   Patient ID: Richard Carroll, male    DOB: 12-14-1970  Age: 52 y.o. MRN: [id number]  Chief Complaint  Patient presents with   Follow-up    HPI   Richard Carroll is seen for medical follow-up.  Has type 2 diabetes with recent poor control.  Last A1c 7.8% and we increased Trulicity from 3 mg to 4.5 mg subcutaneous once weekly.  Tolerating well with no side effects.  He also remains on glimepiride, metformin, and Jardiance.  No recent hypoglycemic symptoms.  Weight relatively stable overall.  He has hyperlipidemia treated with simvastatin.  Recent lipids were reviewed and stable.  Past Medical History:  Diagnosis Date   Arthritis    Diabetes mellitus type II    DIABETES MELLITUS, TYPE II 08/18/2009   on meds   Hay fever    with allergies   Hyperlipidemia    HYPERLIPIDEMIA 08/18/2009   on meds   Seasonal allergies    Past Surgical History:  Procedure Laterality Date   FINGER SURGERY Right    ring finger   KNEE SURGERY Right 2005    reports that he has never smoked. His smokeless tobacco use includes chew. He reports that he does not currently use alcohol. He reports that he does not use drugs. family history includes Alcohol abuse in his maternal grandfather; Colon cancer in his paternal uncle; Colon cancer (age of onset: 77) in his father; Diabetes in his maternal grandmother and mother; Heart disease in his father; Hyperlipidemia in his mother; Hypertension in his mother; Prostate cancer in his paternal uncle; Prostate cancer (age of onset: 68) in his father; Stroke in his mother. No Known Allergies  Review of Systems  Constitutional:  Negative for malaise/fatigue.  Eyes:  Negative for blurred vision.  Respiratory:  Negative for shortness of breath.   Cardiovascular:  Negative for chest pain.  Neurological:  Negative for dizziness, weakness and headaches.      Objective:     BP 120/70 (BP Location: Left Arm, Patient Position:  Sitting, Cuff Size: Normal)   Pulse 87   Temp 98.9 F (37.2 C) (Oral)   Ht '5\' 10"'$  (1.778 m)   Wt 204 lb 8 oz (92.8 kg)   SpO2 97%   BMI 29.34 kg/m    Physical Exam Vitals reviewed.  Constitutional:      General: He is not in acute distress. Cardiovascular:     Rate and Rhythm: Normal rate and regular rhythm.  Pulmonary:     Effort: Pulmonary effort is normal.     Breath sounds: Normal breath sounds. No wheezing or rales.  Neurological:     Mental Status: He is alert.      Results for orders placed or performed in visit on 04/20/22  POCT glycosylated hemoglobin (Hb A1C)  Result Value Ref Range   Hemoglobin A1C 7.1 (A) 4.0 - 5.6 %   HbA1c POC (<> result, manual entry)     HbA1c, POC (prediabetic range)     HbA1c, POC (controlled diabetic range)        The 10-year ASCVD risk score (Arnett DK, et al., 2019) is: 3.8%    Assessment & Plan:   Problem List Items Addressed This Visit       Unprioritized   Type 2 diabetes mellitus, uncontrolled ([place]) - Primary   Relevant Orders   POCT glycosylated hemoglobin (Hb A1C) (Completed)  Improved with A1c 7.1%.  Continue Trulicity 4.5 mg subcutaneous once weekly, glimepiride  4 mg once daily, Jardiance 25 mg daily, and metformin 500 mg 2 tablets twice daily  -Continue yearly diabetic eye exam  -Set up 68-monthfollow-up  Return in about 6 months (around 10/19/2022).    BCarolann Littler MD

## 2022-04-20 NOTE — Patient Instructions (Signed)
A1C down to 7.1%      Continue with current medication doses  Let's plan on 6 month follow up.

## 2022-05-15 ENCOUNTER — Other Ambulatory Visit: Payer: Self-pay | Admitting: Family Medicine

## 2022-05-19 ENCOUNTER — Other Ambulatory Visit: Payer: Self-pay | Admitting: Family Medicine

## 2022-05-27 DIAGNOSIS — R509 Fever, unspecified: Secondary | ICD-10-CM | POA: Diagnosis not present

## 2022-05-27 DIAGNOSIS — E1165 Type 2 diabetes mellitus with hyperglycemia: Secondary | ICD-10-CM | POA: Diagnosis not present

## 2022-05-27 DIAGNOSIS — R0981 Nasal congestion: Secondary | ICD-10-CM | POA: Diagnosis not present

## 2022-05-27 DIAGNOSIS — R519 Headache, unspecified: Secondary | ICD-10-CM | POA: Diagnosis not present

## 2022-05-27 DIAGNOSIS — R059 Cough, unspecified: Secondary | ICD-10-CM | POA: Diagnosis not present

## 2022-05-27 DIAGNOSIS — R5381 Other malaise: Secondary | ICD-10-CM | POA: Diagnosis not present

## 2022-07-10 ENCOUNTER — Other Ambulatory Visit: Payer: Self-pay | Admitting: Family Medicine

## 2022-08-06 ENCOUNTER — Other Ambulatory Visit: Payer: Self-pay | Admitting: Family Medicine

## 2022-09-17 ENCOUNTER — Telehealth: Payer: Self-pay | Admitting: Family Medicine

## 2022-09-17 NOTE — Telephone Encounter (Signed)
Prescription Request  09/17/2022  LOV: 04/20/2022  says he has been out since March, wants to know if he should try something else that is more available  What is the name of the medication or equipment? Dulaglutide (TRULICITY) 4.5 MG/0.5ML SOPN   Have you contacted your pharmacy to request a refill? No   Which pharmacy would you like this sent to?   [id number] - [place], [place] - [address] [place] [address] Phone: [phone] Fax: [phone]    Patient notified that their request is being sent to the clinical staff for review and that they should receive a response within 2 business days.   Please advise at Mobile [phone] (mobile)

## 2022-09-20 NOTE — Telephone Encounter (Signed)
I spoke with the patient and he reported the pharmacy does not have current dosage of medication in stock and they have been out for quite some time.

## 2022-09-20 NOTE — Telephone Encounter (Signed)
Left a message for the patient to return my call.  

## 2022-09-22 NOTE — Telephone Encounter (Signed)
Patient informed of the message below and voiced understanding  

## 2022-09-23 ENCOUNTER — Other Ambulatory Visit: Payer: Self-pay | Admitting: Family Medicine

## 2022-10-13 ENCOUNTER — Encounter: Payer: Self-pay | Admitting: Family Medicine

## 2022-10-13 ENCOUNTER — Ambulatory Visit (INDEPENDENT_AMBULATORY_CARE_PROVIDER_SITE_OTHER): Payer: BC Managed Care – PPO | Admitting: Family Medicine

## 2022-10-13 VITALS — BP 126/80 | HR 102 | Temp 98.3°F | Ht 70.0 in | Wt 197.5 lb

## 2022-10-13 DIAGNOSIS — K625 Hemorrhage of anus and rectum: Secondary | ICD-10-CM

## 2022-10-13 DIAGNOSIS — E1165 Type 2 diabetes mellitus with hyperglycemia: Secondary | ICD-10-CM

## 2022-10-13 DIAGNOSIS — R197 Diarrhea, unspecified: Secondary | ICD-10-CM

## 2022-10-13 DIAGNOSIS — Z7984 Long term (current) use of oral hypoglycemic drugs: Secondary | ICD-10-CM

## 2022-10-13 MED ORDER — TRULICITY 0.75 MG/0.5ML ~~LOC~~ SOAJ: 0.7500 mg | SUBCUTANEOUS | 2 refills | Status: DC

## 2022-10-13 NOTE — Patient Instructions (Signed)
HOLD the Metformin for the next few days-- until the diarrhea resolves.

## 2022-10-13 NOTE — Progress Notes (Signed)
Established Patient Office Visit  Subjective   Patient ID: Richard Carroll, male    DOB: [birth year]  Age: 52 y.o. MRN: [id number]  Chief Complaint  Patient presents with   Blood In Stools    HPI   Richard Carroll is seen with some bright red blood per rectum with wiping past couple of days.  He relates about 1 week history of some intermittent diarrhea especially after eating.  Usually about 2 episodes per day.  Did not notice any blood till couple days ago.  Denies any recent constipation issues.  No perianal pain.  No nausea or vomiting.  Has taken some Imodium for the diarrhea symptoms occasionally.  He had colonoscopy 10/22 with multiple tubular adenomas with recommended 5-year follow-up.  He had some inflammatory changes but biopsies showed no evidence for inflammatory bowel disease.  Patient has been on no recent antibiotics.  Appetite stable.  Denies any abdominal pain at this time.  He has type 2 diabetes with history of recent poor control.  Has been unable to take Trulicity in recent months because of nonavailability.  He remains on metformin, Jardiance, glimepiride.  Was doing fairly well when taking Trulicity at higher dose.  He hopes to get back on this soon.  Past Medical History:  Diagnosis Date   Arthritis    Diabetes mellitus type II    DIABETES MELLITUS, TYPE II 08/18/2009   on meds   Hay fever    with allergies   Hyperlipidemia    HYPERLIPIDEMIA 08/18/2009   on meds   Seasonal allergies    Past Surgical History:  Procedure Laterality Date   FINGER SURGERY Right    ring finger   KNEE SURGERY Right 2005    reports that he has never smoked. His smokeless tobacco use includes chew. He reports that he does not currently use alcohol. He reports that he does not use drugs. family history includes Alcohol abuse in his maternal grandfather; Colon cancer in his paternal uncle; Colon cancer (age of onset: 69) in his father; Diabetes in his maternal grandmother and mother;  Heart disease in his father; Hyperlipidemia in his mother; Hypertension in his mother; Prostate cancer in his paternal uncle; Prostate cancer (age of onset: 73) in his father; Stroke in his mother. No Known Allergies  Review of Systems  Constitutional:  Negative for chills, fever and malaise/fatigue.  Respiratory:  Negative for shortness of breath.   Cardiovascular:  Negative for chest pain.  Gastrointestinal:  Positive for diarrhea. Negative for abdominal pain, constipation, melena, nausea and vomiting.  Genitourinary:  Negative for dysuria and hematuria.  Neurological:  Negative for dizziness.      Objective:     BP 126/80 (BP Location: Left Arm, Patient Position: Sitting, Cuff Size: Normal)   Pulse (!) 102   Temp 98.3 F (36.8 C) (Oral)   Ht 5\' 10"  (1.778 m)   Wt 197 lb 8 oz (89.6 kg)   SpO2 96%   BMI 28.34 kg/m  BP Readings from Last 3 Encounters:  10/13/22 126/80  04/20/22 120/70  03/04/22 126/83   Wt Readings from Last 3 Encounters:  10/13/22 197 lb 8 oz (89.6 kg)  04/20/22 204 lb 8 oz (92.8 kg)  03/04/22 205 lb (93 kg)      Physical Exam Vitals reviewed.  Constitutional:      Appearance: Normal appearance.  Cardiovascular:     Rate and Rhythm: Normal rate and regular rhythm.  Pulmonary:     Effort: Pulmonary effort  is normal.     Breath sounds: Normal breath sounds. No wheezing or rales.  Abdominal:     General: There is no distension.     Palpations: Abdomen is soft. There is no mass.     Tenderness: There is no abdominal tenderness. There is no guarding or rebound.  Genitourinary:    Comments: Rectal exam reveals no external hemorrhoids.  No obvious anal fissure.  Digital exam reveals no palpated mass.  Scant brown stool which is Hemoccult positive.  No gross blood. Neurological:     Mental Status: He is alert.      No results found for any visits on 10/13/22.  Last CBC Lab Results  Component Value Date   WBC 14.2 (H) 12/15/2015   HGB 15.7  12/15/2015   HCT 45.5 12/15/2015   MCV 88.9 12/15/2015   MCH 30.7 12/15/2015   RDW 12.5 12/15/2015   PLT 243 12/15/2015   Last metabolic panel Lab Results  Component Value Date   GLUCOSE 184 (H) 01/05/2022   NA 140 01/05/2022   K 4.4 01/05/2022   CL 104 01/05/2022   CO2 28 01/05/2022   BUN 18 01/05/2022   CREATININE 0.96 01/05/2022   GFR 92.57 01/05/2022   CALCIUM 9.9 01/05/2022   PROT 7.0 01/05/2022   ALBUMIN 4.4 01/05/2022   BILITOT 0.5 01/05/2022   ALKPHOS 53 01/05/2022   AST 16 01/05/2022   ALT 21 01/05/2022   ANIONGAP 8 12/15/2015   Last lipids Lab Results  Component Value Date   CHOL 142 01/05/2022   HDL 47.70 01/05/2022   LDLCALC 73 01/05/2022   LDLDIRECT 113.0 01/13/2018   TRIG 106.0 01/05/2022   CHOLHDL 3 01/05/2022   Last hemoglobin A1c Lab Results  Component Value Date   HGBA1C 7.1 (A) 04/20/2022      The 10-year ASCVD risk score (Arnett DK, et al., 2019) is: 4.2%    Assessment & Plan:   #1  Bright red blood per rectum.  Question related to internal hemorrhoids.  Did have internal hemorrhoids on last colonoscopy.  No obvious anal fissure.  No external hemorrhoids.  Bleeding has been relatively mild.  Does have Hemoccult positive test in office today.  Colonoscopy 10/22 as above.  Possibly irritated from recent diarrhea. Patient does not take any aspirin or anticoagulants.  Observe for now.  Be in touch for any worsening bleeding or persistent bleeding.  He is due for repeat colonoscopy 10/25  #2 diarrhea.  Question viral.  Diarrhea relatively mild in 1 week duration.  No recent antibiotics.  Hold metformin for now.  Handout on appropriate diet given for bland diet.  Avoid fatty foods and high glycemic foods.  May continue as needed Imodium.  Be in touch if diarrhea not resolving over the next week.  Also stressed adequate hydration and electrolyte replacement If diarrhea not resolving in a week or so contact GI  #3 type 2 diabetes.  Patient off  Trulicity for about 4 months secondary to availability issues.  He was contacted recently that his pharmacy has this back in stock.  Will need to start back titration at 0.75 mg once weekly and if tolerating well in 1 month increase to 1.5 mg.  We sent new prescription for 0.75 mg to his pharmacy.  He has follow-up in a couple weeks and we will plan to get some follow-up labs then  Evelena Peat, MD

## 2022-10-18 ENCOUNTER — Other Ambulatory Visit: Payer: Self-pay

## 2022-10-18 ENCOUNTER — Telehealth: Payer: Self-pay | Admitting: Family Medicine

## 2022-10-18 ENCOUNTER — Emergency Department (HOSPITAL_BASED_OUTPATIENT_CLINIC_OR_DEPARTMENT_OTHER)
Admission: EM | Admit: 2022-10-18 | Discharge: 2022-10-18 | Disposition: A | Discharge status: Home / Self Care | Payer: BC Managed Care – PPO | Attending: Emergency Medicine | Admitting: Emergency Medicine

## 2022-10-18 ENCOUNTER — Encounter (HOSPITAL_BASED_OUTPATIENT_CLINIC_OR_DEPARTMENT_OTHER): Payer: Self-pay

## 2022-10-18 DIAGNOSIS — K921 Melena: Secondary | ICD-10-CM | POA: Insufficient documentation

## 2022-10-18 DIAGNOSIS — K625 Hemorrhage of anus and rectum: Secondary | ICD-10-CM

## 2022-10-18 DIAGNOSIS — Z7984 Long term (current) use of oral hypoglycemic drugs: Secondary | ICD-10-CM | POA: Diagnosis not present

## 2022-10-18 DIAGNOSIS — R109 Unspecified abdominal pain: Secondary | ICD-10-CM | POA: Diagnosis not present

## 2022-10-18 LAB — CBC WITH DIFFERENTIAL/PLATELET
Abs Immature Granulocytes: 0.04 10*3/uL (ref 0.00–0.07)
Basophils Absolute: 0.1 10*3/uL (ref 0.0–0.1)
Basophils Relative: 1 %
Eosinophils Absolute: 0.7 10*3/uL — ABNORMAL HIGH (ref 0.0–0.5)
Eosinophils Relative: 7 %
HCT: 41.6 % (ref 39.0–52.0)
Hemoglobin: 14.2 g/dL (ref 13.0–17.0)
Immature Granulocytes: 1 %
Lymphocytes Relative: 13 %
Lymphs Abs: 1.2 10*3/uL (ref 0.7–4.0)
MCH: 30.7 pg (ref 26.0–34.0)
MCHC: 34.1 g/dL (ref 30.0–36.0)
MCV: 90 fL (ref 80.0–100.0)
Monocytes Absolute: 1.2 10*3/uL — ABNORMAL HIGH (ref 0.1–1.0)
Monocytes Relative: 13 %
Neutro Abs: 5.8 10*3/uL (ref 1.7–7.7)
Neutrophils Relative %: 65 %
Platelets: 280 10*3/uL (ref 150–400)
RBC: 4.62 MIL/uL (ref 4.22–5.81)
RDW: 12.5 % (ref 11.5–15.5)
WBC: 8.9 10*3/uL (ref 4.0–10.5)
nRBC: 0 % (ref 0.0–0.2)

## 2022-10-18 LAB — COMPREHENSIVE METABOLIC PANEL
ALT: 21 U/L (ref 0–44)
AST: 18 U/L (ref 15–41)
Albumin: 2.9 g/dL — ABNORMAL LOW (ref 3.5–5.0)
Alkaline Phosphatase: 84 U/L (ref 38–126)
Anion gap: 10 (ref 5–15)
BUN: 8 mg/dL (ref 6–20)
CO2: 24 mmol/L (ref 22–32)
Calcium: 8.3 mg/dL — ABNORMAL LOW (ref 8.9–10.3)
Chloride: 98 mmol/L (ref 98–111)
Creatinine, Ser: 0.93 mg/dL (ref 0.61–1.24)
GFR, Estimated: >60 mL/min (ref >60)
Glucose, Bld: 563 mg/dL (ref 70–99)
Potassium: 3.9 mmol/L (ref 3.5–5.1)
Sodium: 132 mmol/L — ABNORMAL LOW (ref 135–145)
Total Bilirubin: 0.5 mg/dL (ref 0.3–1.2)
Total Protein: 6.1 g/dL — ABNORMAL LOW (ref 6.5–8.1)

## 2022-10-18 LAB — C DIFFICILE QUICK SCREEN W PCR REFLEX
C Diff antigen: NEGATIVE
C Diff interpretation: NOT DETECTED
C Diff toxin: NEGATIVE

## 2022-10-18 LAB — CBG MONITORING, ED: Glucose-Capillary: 297 mg/dL — ABNORMAL HIGH (ref 70–99)

## 2022-10-18 LAB — LIPASE, BLOOD: Lipase: 26 U/L (ref 11–51)

## 2022-10-18 MED ORDER — ONDANSETRON HCL 4 MG/2ML IJ SOLN
4.0000 mg | Freq: Once | INTRAMUSCULAR | Status: AC
  Administered 2022-10-18: 4 mg via INTRAVENOUS
  Filled 2022-10-18: qty 2

## 2022-10-18 MED ORDER — SODIUM CHLORIDE 0.9 % IV BOLUS
1000.0000 mL | Freq: Once | INTRAVENOUS | Status: AC
  Administered 2022-10-18: 1000 mL via INTRAVENOUS

## 2022-10-18 MED ORDER — AZITHROMYCIN 250 MG PO TABS: ORAL_TABLET | ORAL | 0 refills | Status: DC

## 2022-10-18 MED ORDER — MORPHINE SULFATE (PF) 2 MG/ML IV SOLN
2.0000 mg | Freq: Once | INTRAVENOUS | Status: AC
  Administered 2022-10-18: 2 mg via INTRAVENOUS
  Filled 2022-10-18: qty 1

## 2022-10-18 NOTE — ED Triage Notes (Signed)
Pt reports to ED with complaints of blood in stool x 2 weeks.

## 2022-10-18 NOTE — ED Provider Notes (Signed)
[place] EMERGENCY DEPARTMENT AT [facility] Provider Note   CSN: [id number] Arrival date & time: 10/18/22  4098     History  Chief Complaint  Patient presents with   Blood In Stools    Richard Carroll is a 52 y.o. male.  52 yo M with a cc of BRBPR.  Going on for two weeks.  Started on the 4th of July.  Denies any fevers.  Has some cramping. BM after oral intake.  Denies suspicious food intake.  Denies recent travel.         Home Medications Prior to Admission medications   Medication Sig Start Date End Date Taking? Authorizing Provider  azithromycin (ZITHROMAX) 250 MG tablet Take first 2 tablets a day for three days 10/18/22  Yes Melene Plan, DO  Dulaglutide (TRULICITY) 0.75 MG/0.5ML SOPN Inject 0.75 mg into the skin once a week. 10/13/22   Burchette, Elberta Fortis, MD  glimepiride (AMARYL) 4 MG tablet TAKE 1 TABLET BY MOUTH EVERY DAY BEFORE BREAKFAST 07/12/22   Burchette, Elberta Fortis, MD  JARDIANCE 25 MG TABS tablet TAKE 1 TABLET BY MOUTH EVERY DAY BEFORE BREAKFAST 08/06/22   Burchette, Elberta Fortis, MD  lidocaine (HM LIDOCAINE PATCH) 4 % Place 1 patch onto the skin daily. 03/04/22   Lonell Grandchild, MD  metFORMIN (GLUCOPHAGE) 500 MG tablet TAKE 2 TABLETS (1,000 MG TOTAL) BY MOUTH 2 (TWO) TIMES DAILY WITH A MEAL. 09/24/22   Burchette, Elberta Fortis, MD  Multiple Vitamins-Minerals (MULTIVITAMIN) LIQD Take 1 tablet by mouth daily at 6 (six) AM.    [provider]  sildenafil (VIAGRA) 100 MG tablet Take 0.5-1 tablets (50-100 mg total) by mouth daily as needed for erectile dysfunction. 01/19/19   Burchette, Elberta Fortis, MD  simvastatin (ZOCOR) 80 MG tablet TAKE 1/2 TABLET BY MOUTH EVERY DAY 05/20/22   Burchette, Elberta Fortis, MD      Allergies    Patient has no known allergies.    Review of Systems   Review of Systems  Physical Exam Updated Vital Signs BP (!) 104/91   Pulse 81   Temp 97.9 F (36.6 C)   Resp 18   Ht 5\' 10"  (1.778 m)   Wt 88 kg   SpO2 97%   BMI 27.84 kg/m   Physical Exam Vitals and nursing note reviewed.  Constitutional:      Appearance: He is well-developed.  HENT:     Head: Normocephalic and atraumatic.  Eyes:     Pupils: Pupils are equal, round, and reactive to light.  Neck:     Vascular: No JVD.  Cardiovascular:     Rate and Rhythm: Normal rate and regular rhythm.     Heart sounds: No murmur heard.    No friction rub. No gallop.  Pulmonary:     Effort: No respiratory distress.     Breath sounds: No wheezing.  Abdominal:     General: There is no distension.     Tenderness: There is no abdominal tenderness. There is no guarding or rebound.  Musculoskeletal:        General: Normal range of motion.     Cervical back: Normal range of motion and neck supple.  Skin:    Coloration: Skin is not pale.     Findings: No rash.  Neurological:     Mental Status: He is alert and oriented to person, place, and time.  Psychiatric:        Behavior: Behavior normal.     ED  Results / Procedures / Treatments   Labs (all labs ordered are listed, but only abnormal results are displayed) Labs Reviewed  CBC WITH DIFFERENTIAL/PLATELET - Abnormal; Notable for the following components:      Result Value   Monocytes Absolute 1.2 (*)    Eosinophils Absolute 0.7 (*)    All other components within normal limits  COMPREHENSIVE METABOLIC PANEL - Abnormal; Notable for the following components:   Sodium 132 (*)    Glucose, Bld 563 (*)    Calcium 8.3 (*)    Total Protein 6.1 (*)    Albumin 2.9 (*)    All other components within normal limits  CBG MONITORING, ED - Abnormal; Notable for the following components:   Glucose-Capillary 297 (*)    All other components within normal limits  GASTROINTESTINAL PANEL BY PCR, STOOL (REPLACES STOOL CULTURE)  C DIFFICILE QUICK SCREEN W PCR REFLEX    LIPASE, BLOOD    EKG None  Radiology No results found.  Procedures Procedures    Medications Ordered in ED Medications  morphine (PF) 2 MG/ML  injection 2 mg (2 mg Intravenous Given 10/18/22 0944)  ondansetron (ZOFRAN) injection 4 mg (4 mg Intravenous Given 10/18/22 0944)  sodium chloride 0.9 % bolus 1,000 mL (0 mLs Intravenous Stopped 10/18/22 1054)  sodium chloride 0.9 % bolus 1,000 mL (1,000 mLs Intravenous New Bag/Given 10/18/22 1054)    ED Course/ Medical Decision Making/ A&P                             Medical Decision Making Amount and/or Complexity of Data Reviewed Labs: ordered.  Risk Prescription drug management.   52 yo M with a chief complaint of bright red blood per rectum.  This been going on for couple weeks.  He denies any suspicious food intake though [place] was [place] the beach on [place] when it started.  No fevers no abdominal pain with the exception of some mild cramping prior to a bowel movement.  He was seen by his doctor on my record review about 5 days ago with plans to follow-up with GI.  He thought that the blood was more profuse today and so came here to be evaluated.  Will obtain a laboratory evaluation to assess for acute anemia.  Likely needs outpatient GI follow-up.  No acute anemia, patient was significantly hyperglycemic.  On further discussion he was told by his PCP to stop his metformin with his diarrhea.  He also recently had a change of his Trulicity dose.  Given a bolus of IV fluids with improvement.  Will have him discuss this with his PCP.  Short course of azithromycin for possible infectious diarrhea.   1:07 PM:  I have discussed the diagnosis/risks/treatment options with the patient and family.  Evaluation and diagnostic testing in the emergency department does not suggest an emergent condition requiring admission or immediate intervention beyond what has been performed at this time.  They will follow up with PCP. We also discussed returning to the ED immediately if new or worsening sx occur. We discussed the sx which are most concerning (e.g., sudden worsening pain, fever, inability to tolerate by  mouth) that necessitate immediate return. Medications administered to the patient during their visit and any new prescriptions provided to the patient are listed below.  Medications given during this visit Medications  morphine (PF) 2 MG/ML injection 2 mg (2 mg Intravenous Given 10/18/22 0944)  ondansetron (ZOFRAN) injection 4  mg (4 mg Intravenous Given 10/18/22 0944)  sodium chloride 0.9 % bolus 1,000 mL (0 mLs Intravenous Stopped 10/18/22 1054)  sodium chloride 0.9 % bolus 1,000 mL (1,000 mLs Intravenous New Bag/Given 10/18/22 1054)     The patient appears reasonably screen and/or stabilized for discharge and I doubt any other medical condition or other [facility] requiring further screening, evaluation, or treatment in the ED at this time prior to discharge.           Final Clinical Impression(s) / ED Diagnoses Final diagnoses:  Bloody stool    Rx / DC Orders ED Discharge Orders          Ordered    azithromycin (ZITHROMAX) 250 MG tablet        10/18/22 1301              Melene Plan, DO 10/18/22 1307

## 2022-10-18 NOTE — ED Notes (Signed)
ED Provider at bedside. 

## 2022-10-18 NOTE — Telephone Encounter (Signed)
Referral placed and patient aware. Patient was informed of the message below and voiced understanding. Patient has f/u scheduled for 10/26/22

## 2022-10-18 NOTE — Telephone Encounter (Signed)
Pt went to the ED with blood in his stool.  Pt was referred to [facility], but they cannot see him until September.   Pt & spouse are asking if MD can provide any assistance//advice, at all?  Wife is very worried.   Also, FYI:   Pt's blood sugar was close to 600, upon arriving to the ED this morning.   They were able to get it down to around 297,  and sent Pt home.

## 2022-10-18 NOTE — ED Notes (Signed)
Discharge paperwork reviewed entirely with patient, including follow up care. Pain was under control. The patient received instruction and coaching on their prescriptions, and all follow-up questions were answered.  Pt verbalized understanding as well as all parties involved. No questions or concerns voiced at the time of discharge. No acute distress noted.   Pt ambulated out to [facility] without incident or assistance.  

## 2022-10-18 NOTE — Discharge Instructions (Addendum)
Talk with your family doctor about your blood sugar being high.  They may want to change her medications for home.  I have started you on a short course of antibiotics to treat you in case this is infectious diarrhea.  Please follow-up with your gastroenterologist in the office.

## 2022-10-19 ENCOUNTER — Emergency Department (HOSPITAL_COMMUNITY)
Admission: EM | Admit: 2022-10-19 | Discharge: 2022-10-19 | Disposition: A | Discharge status: Home / Self Care | Payer: BC Managed Care – PPO | Source: Home / Self Care | Attending: Emergency Medicine | Admitting: Emergency Medicine

## 2022-10-19 ENCOUNTER — Other Ambulatory Visit: Payer: Self-pay

## 2022-10-19 ENCOUNTER — Emergency Department (HOSPITAL_COMMUNITY): Payer: BC Managed Care – PPO

## 2022-10-19 ENCOUNTER — Telehealth: Payer: Self-pay | Admitting: Gastroenterology

## 2022-10-19 DIAGNOSIS — Z8249 Family history of ischemic heart disease and other diseases of the circulatory system: Secondary | ICD-10-CM | POA: Diagnosis not present

## 2022-10-19 DIAGNOSIS — R112 Nausea with vomiting, unspecified: Secondary | ICD-10-CM | POA: Diagnosis not present

## 2022-10-19 DIAGNOSIS — Z823 Family history of stroke: Secondary | ICD-10-CM | POA: Diagnosis not present

## 2022-10-19 DIAGNOSIS — K921 Melena: Secondary | ICD-10-CM | POA: Insufficient documentation

## 2022-10-19 DIAGNOSIS — Z79899 Other long term (current) drug therapy: Secondary | ICD-10-CM | POA: Diagnosis not present

## 2022-10-19 DIAGNOSIS — D72829 Elevated white blood cell count, unspecified: Secondary | ICD-10-CM | POA: Insufficient documentation

## 2022-10-19 DIAGNOSIS — Z83438 Family history of other disorder of lipoprotein metabolism and other lipidemia: Secondary | ICD-10-CM | POA: Diagnosis not present

## 2022-10-19 DIAGNOSIS — Z83719 Family history of colon polyps, unspecified: Secondary | ICD-10-CM | POA: Diagnosis not present

## 2022-10-19 DIAGNOSIS — E876 Hypokalemia: Secondary | ICD-10-CM | POA: Diagnosis not present

## 2022-10-19 DIAGNOSIS — Z800 Family history of malignant neoplasm of digestive organs: Secondary | ICD-10-CM | POA: Diagnosis not present

## 2022-10-19 DIAGNOSIS — K760 Fatty (change of) liver, not elsewhere classified: Secondary | ICD-10-CM | POA: Diagnosis not present

## 2022-10-19 DIAGNOSIS — Z7985 Long-term (current) use of injectable non-insulin antidiabetic drugs: Secondary | ICD-10-CM | POA: Diagnosis not present

## 2022-10-19 DIAGNOSIS — K50111 Crohn's disease of large intestine with rectal bleeding: Secondary | ICD-10-CM | POA: Diagnosis not present

## 2022-10-19 DIAGNOSIS — E119 Type 2 diabetes mellitus without complications: Secondary | ICD-10-CM | POA: Diagnosis not present

## 2022-10-19 DIAGNOSIS — R000 Tachycardia, unspecified: Secondary | ICD-10-CM | POA: Insufficient documentation

## 2022-10-19 DIAGNOSIS — Z8042 Family history of malignant neoplasm of prostate: Secondary | ICD-10-CM | POA: Diagnosis not present

## 2022-10-19 DIAGNOSIS — K5010 Crohn's disease of large intestine without complications: Secondary | ICD-10-CM | POA: Diagnosis not present

## 2022-10-19 DIAGNOSIS — R531 Weakness: Secondary | ICD-10-CM | POA: Diagnosis not present

## 2022-10-19 DIAGNOSIS — R109 Unspecified abdominal pain: Secondary | ICD-10-CM | POA: Diagnosis not present

## 2022-10-19 DIAGNOSIS — Z7984 Long term (current) use of oral hypoglycemic drugs: Secondary | ICD-10-CM | POA: Diagnosis not present

## 2022-10-19 DIAGNOSIS — E860 Dehydration: Secondary | ICD-10-CM | POA: Diagnosis not present

## 2022-10-19 DIAGNOSIS — R197 Diarrhea, unspecified: Secondary | ICD-10-CM | POA: Diagnosis not present

## 2022-10-19 DIAGNOSIS — E1169 Type 2 diabetes mellitus with other specified complication: Secondary | ICD-10-CM | POA: Diagnosis not present

## 2022-10-19 DIAGNOSIS — K5190 Ulcerative colitis, unspecified, without complications: Secondary | ICD-10-CM | POA: Diagnosis not present

## 2022-10-19 DIAGNOSIS — T380X5A Adverse effect of glucocorticoids and synthetic analogues, initial encounter: Secondary | ICD-10-CM | POA: Diagnosis not present

## 2022-10-19 DIAGNOSIS — R634 Abnormal weight loss: Secondary | ICD-10-CM | POA: Diagnosis not present

## 2022-10-19 DIAGNOSIS — K529 Noninfective gastroenteritis and colitis, unspecified: Secondary | ICD-10-CM | POA: Insufficient documentation

## 2022-10-19 DIAGNOSIS — M1990 Unspecified osteoarthritis, unspecified site: Secondary | ICD-10-CM | POA: Diagnosis present

## 2022-10-19 DIAGNOSIS — Z86010 Personal history of colonic polyps: Secondary | ICD-10-CM | POA: Diagnosis not present

## 2022-10-19 DIAGNOSIS — E785 Hyperlipidemia, unspecified: Secondary | ICD-10-CM | POA: Diagnosis not present

## 2022-10-19 DIAGNOSIS — E1110 Type 2 diabetes mellitus with ketoacidosis without coma: Secondary | ICD-10-CM | POA: Diagnosis not present

## 2022-10-19 DIAGNOSIS — E1165 Type 2 diabetes mellitus with hyperglycemia: Secondary | ICD-10-CM | POA: Diagnosis not present

## 2022-10-19 DIAGNOSIS — Z833 Family history of diabetes mellitus: Secondary | ICD-10-CM | POA: Diagnosis not present

## 2022-10-19 DIAGNOSIS — Z720 Tobacco use: Secondary | ICD-10-CM | POA: Diagnosis not present

## 2022-10-19 LAB — CBC WITH DIFFERENTIAL/PLATELET
Abs Immature Granulocytes: 0.6 10*3/uL — ABNORMAL HIGH (ref 0.00–0.07)
Band Neutrophils: 37 %
Basophils Absolute: 0 10*3/uL (ref 0.0–0.1)
Basophils Relative: 0 %
Eosinophils Absolute: 0.7 10*3/uL — ABNORMAL HIGH (ref 0.0–0.5)
Eosinophils Relative: 6 %
HCT: 41.7 % (ref 39.0–52.0)
Hemoglobin: 14 g/dL (ref 13.0–17.0)
Lymphocytes Relative: 18 %
Lymphs Abs: 2.2 10*3/uL (ref 0.7–4.0)
MCH: 30.8 pg (ref 26.0–34.0)
MCHC: 33.6 g/dL (ref 30.0–36.0)
MCV: 91.9 fL (ref 80.0–100.0)
Metamyelocytes Relative: 5 %
Monocytes Absolute: 0.4 10*3/uL (ref 0.1–1.0)
Monocytes Relative: 3 %
Neutro Abs: 8.3 10*3/uL — ABNORMAL HIGH (ref 1.7–7.7)
Neutrophils Relative %: 31 %
Platelets: 322 10*3/uL (ref 150–400)
RBC: 4.54 MIL/uL (ref 4.22–5.81)
RDW: 12.6 % (ref 11.5–15.5)
WBC: 12.2 10*3/uL — ABNORMAL HIGH (ref 4.0–10.5)
nRBC: 0 % (ref 0.0–0.2)

## 2022-10-19 LAB — URINALYSIS, ROUTINE W REFLEX MICROSCOPIC
Bacteria, UA: NONE SEEN
Bilirubin Urine: NEGATIVE
Glucose, UA: >=500 mg/dL — AB
Hgb urine dipstick: NEGATIVE
Ketones, ur: 80 mg/dL — AB
Leukocytes,Ua: NEGATIVE
Nitrite: NEGATIVE
Protein, ur: NEGATIVE mg/dL
Specific Gravity, Urine: 1.01 (ref 1.005–1.030)
pH: 5 (ref 5.0–8.0)

## 2022-10-19 LAB — COMPREHENSIVE METABOLIC PANEL
ALT: 14 U/L (ref 0–44)
AST: 11 U/L — ABNORMAL LOW (ref 15–41)
Albumin: 2.7 g/dL — ABNORMAL LOW (ref 3.5–5.0)
Alkaline Phosphatase: 74 U/L (ref 38–126)
Anion gap: 12 (ref 5–15)
BUN: 7 mg/dL (ref 6–20)
CO2: 21 mmol/L — ABNORMAL LOW (ref 22–32)
Calcium: 8 mg/dL — ABNORMAL LOW (ref 8.9–10.3)
Chloride: 105 mmol/L (ref 98–111)
Creatinine, Ser: 0.75 mg/dL (ref 0.61–1.24)
GFR, Estimated: >60 mL/min (ref >60)
Glucose, Bld: 100 mg/dL — ABNORMAL HIGH (ref 70–99)
Potassium: 3.3 mmol/L — ABNORMAL LOW (ref 3.5–5.1)
Sodium: 138 mmol/L (ref 135–145)
Total Bilirubin: 0.8 mg/dL (ref 0.3–1.2)
Total Protein: 6 g/dL — ABNORMAL LOW (ref 6.5–8.1)

## 2022-10-19 LAB — GASTROINTESTINAL PANEL BY PCR, STOOL (REPLACES STOOL CULTURE)

## 2022-10-19 LAB — LIPASE, BLOOD: Lipase: 23 U/L (ref 11–51)

## 2022-10-19 MED ORDER — POTASSIUM CHLORIDE CRYS ER 20 MEQ PO TBCR
20.0000 meq | EXTENDED_RELEASE_TABLET | Freq: Once | ORAL | Status: AC
  Administered 2022-10-19: 20 meq via ORAL
  Filled 2022-10-19: qty 2

## 2022-10-19 MED ORDER — IOHEXOL 300 MG/ML  SOLN
100.0000 mL | Freq: Once | INTRAMUSCULAR | Status: AC | PRN
  Administered 2022-10-19: 100 mL via INTRAVENOUS

## 2022-10-19 MED ORDER — CIPROFLOXACIN HCL 500 MG PO TABS
500.0000 mg | ORAL_TABLET | Freq: Once | ORAL | Status: AC
  Administered 2022-10-19: 500 mg via ORAL
  Filled 2022-10-19: qty 1

## 2022-10-19 MED ORDER — SODIUM CHLORIDE 0.9 % IV BOLUS
1000.0000 mL | Freq: Once | INTRAVENOUS | Status: AC
  Administered 2022-10-19: 1000 mL via INTRAVENOUS

## 2022-10-19 MED ORDER — METRONIDAZOLE 500 MG PO TABS: 500.0000 mg | ORAL_TABLET | Freq: Two times a day (BID) | ORAL | 0 refills | Status: DC

## 2022-10-19 MED ORDER — METRONIDAZOLE 500 MG PO TABS
500.0000 mg | ORAL_TABLET | Freq: Once | ORAL | Status: AC
  Administered 2022-10-19: 500 mg via ORAL
  Filled 2022-10-19: qty 1

## 2022-10-19 MED ORDER — CIPROFLOXACIN HCL 500 MG PO TABS: 500.0000 mg | ORAL_TABLET | Freq: Two times a day (BID) | ORAL | 0 refills | Status: DC

## 2022-10-19 NOTE — Telephone Encounter (Signed)
Called and left a detailed message on patient's voicemail to confirm this appointment

## 2022-10-19 NOTE — ED Provider Triage Note (Signed)
Emergency Medicine Provider Triage Evaluation Note  Richard Carroll , a 52 y.o. male  was evaluated in triage.  Pt complains of bloody diarrhea.  Review of Systems  Positive:  Negative:   Physical Exam  BP 139/81 (BP Location: Left Arm)   Pulse (!) 115   Temp 99.5 F (37.5 C) (Oral)   Resp 18   Ht 5\' 10"  (1.778 m)   Wt 88 kg   SpO2 99%   BMI 27.84 kg/m  Gen:   Awake, no distress   Resp:  Normal effort  MSK:   Moves extremities without difficulty  Other:    Medical Decision Making  Medically screening exam initiated at 4:12 PM.  Appropriate orders placed.  Richard Carroll was informed that the remainder of the evaluation will be completed by another provider, this initial triage assessment does not replace that evaluation, and the importance of remaining in the ED until their evaluation is complete.  Endorsing diarrhea x2 weeks that recently became bloody. Mild generalized abdominal pain. BM quickly after PO intake. Was seen in ED yesterday and discharged with ABX. Did not take ABX. Also endorsing fatigue.  Obtaining labs and CT imaging.   Dorthy Cooler, [place] 10/19/22 [phone]

## 2022-10-19 NOTE — Discharge Instructions (Addendum)
You were seen in the emergency department today for ongoing diarrhea.  As we discussed your lab work looked reassuring.  Your potassium level was slightly low, so we gave you some replacement.  I am changing your antibiotic course to 2 different antibiotics.  These will both be twice daily for a week.  Make sure that you are staying as well-hydrated as you can.  If you had difficulty keeping the medication down, have fever, or worsening pain, return to the ER immediately.  At that time we likely would admit you for IV antibiotics based on our evaluation.

## 2022-10-19 NOTE — ED Triage Notes (Signed)
Pt reports diarrhea x 2 weeks states is now having stools with blood.

## 2022-10-19 NOTE — Telephone Encounter (Signed)
Urgent referral in [place] from PCP for rectal bleeding and blood in stool.  Last saw Dr. Barron Alvine in 2022.  Please review and advise scheduling and urgency.  Thanks

## 2022-10-19 NOTE — ED Notes (Signed)
Patient transported to CT 

## 2022-10-19 NOTE — ED Provider Notes (Signed)
[place] EMERGENCY DEPARTMENT AT [facility] Provider Note   CSN: [id number] Arrival date & time: 10/19/22  1544     History  Chief Complaint  Patient presents with   Diarrhea    Richard Carroll is a 52 y.o. male with history of arthritis, type 2 diabetes, hyperlipidemia who presents the emergency department complaining of diarrhea for 2 weeks.  Was seen in the ER yesterday and discharged with antibiotics, reports he has not taken them.  Diarrhea has become bloody.  Also complaining of some mild abdominal pain.  States he is having diarrhea quickly after p.o. intake.  Associated fatigue.   Diarrhea Associated symptoms: abdominal pain and chills        Home Medications Prior to Admission medications   Medication Sig Start Date End Date Taking? Authorizing Provider  ciprofloxacin (CIPRO) 500 MG tablet Take 1 tablet (500 mg total) by mouth every 12 (twelve) hours. 10/19/22  Yes Kashis Penley T, PA-C  metroNIDAZOLE (FLAGYL) 500 MG tablet Take 1 tablet (500 mg total) by mouth 2 (two) times daily. 10/19/22  Yes Garnetta Fedrick T, PA-C  Dulaglutide (TRULICITY) 0.75 MG/0.5ML SOPN Inject 0.75 mg into the skin once a week. 10/13/22   Burchette, Elberta Fortis, MD  glimepiride (AMARYL) 4 MG tablet TAKE 1 TABLET BY MOUTH EVERY DAY BEFORE BREAKFAST 07/12/22   Burchette, Elberta Fortis, MD  JARDIANCE 25 MG TABS tablet TAKE 1 TABLET BY MOUTH EVERY DAY BEFORE BREAKFAST 08/06/22   Burchette, Elberta Fortis, MD  lidocaine (HM LIDOCAINE PATCH) 4 % Place 1 patch onto the skin daily. 03/04/22   Lonell Grandchild, MD  metFORMIN (GLUCOPHAGE) 500 MG tablet TAKE 2 TABLETS (1,000 MG TOTAL) BY MOUTH 2 (TWO) TIMES DAILY WITH A MEAL. 09/24/22   Burchette, Elberta Fortis, MD  Multiple Vitamins-Minerals (MULTIVITAMIN) LIQD Take 1 tablet by mouth daily at 6 (six) AM.    [provider]  sildenafil (VIAGRA) 100 MG tablet Take 0.5-1 tablets (50-100 mg total) by mouth daily as needed for erectile dysfunction. 01/19/19    Burchette, Elberta Fortis, MD  simvastatin (ZOCOR) 80 MG tablet TAKE 1/2 TABLET BY MOUTH EVERY DAY 05/20/22   Burchette, Elberta Fortis, MD      Allergies    Patient has no known allergies.    Review of Systems   Review of Systems  Constitutional:  Positive for chills.  Gastrointestinal:  Positive for abdominal pain, diarrhea and nausea.  All other systems reviewed and are negative.   Physical Exam Updated Vital Signs BP (!) 143/75 (BP Location: Right Arm)   Pulse 88   Temp 99.3 F (37.4 C) (Oral)   Resp 17   Ht 5\' 10"  (1.778 m)   Wt 88 kg   SpO2 96%   BMI 27.84 kg/m  Physical Exam Vitals and nursing note reviewed.  Constitutional:      Appearance: Normal appearance.  HENT:     Head: Normocephalic and atraumatic.  Eyes:     Conjunctiva/sclera: Conjunctivae normal.  Cardiovascular:     Rate and Rhythm: Regular rhythm. Tachycardia present.  Pulmonary:     Effort: Pulmonary effort is normal. No respiratory distress.     Breath sounds: Normal breath sounds.  Abdominal:     General: There is no distension.     Palpations: Abdomen is soft.     Tenderness: There is no abdominal tenderness.  Skin:    General: Skin is warm and dry.  Neurological:     General: No focal deficit present.  Mental Status: He is alert.     ED Results / Procedures / Treatments   Labs (all labs ordered are listed, but only abnormal results are displayed) Labs Reviewed  COMPREHENSIVE METABOLIC PANEL - Abnormal; Notable for the following components:      Result Value   Potassium 3.3 (*)    CO2 21 (*)    Glucose, Bld 100 (*)    Calcium 8.0 (*)    Total Protein 6.0 (*)    Albumin 2.7 (*)    AST 11 (*)    All other components within normal limits  CBC WITH DIFFERENTIAL/PLATELET - Abnormal; Notable for the following components:   WBC 12.2 (*)    Neutro Abs 8.3 (*)    Eosinophils Absolute 0.7 (*)    Abs Immature Granulocytes 0.60 (*)    All other components within normal limits  URINALYSIS, ROUTINE  W REFLEX MICROSCOPIC - Abnormal; Notable for the following components:   Glucose, UA >=500 (*)    Ketones, ur 80 (*)    All other components within normal limits  LIPASE, BLOOD  PATHOLOGIST SMEAR REVIEW    EKG None  Radiology CT ABDOMEN PELVIS W CONTRAST  Result Date: 10/19/2022 CLINICAL DATA:  Diverticulitis with complication suspected. Diarrhea for 2 weeks and now with bloody stools. EXAM: CT ABDOMEN AND PELVIS WITH CONTRAST TECHNIQUE: Multidetector CT imaging of the abdomen and pelvis was performed using the standard protocol following bolus administration of intravenous contrast. RADIATION DOSE REDUCTION: This exam was performed according to the departmental dose-optimization program which includes automated exposure control, adjustment of the mA and/or kV according to patient size and/or use of iterative reconstruction technique. CONTRAST:  OMNIPAQUE IOHEXOL 300 MG/ML  SOLN COMPARISON:  12/15/2015 FINDINGS: Lower chest: Mild dependent atelectasis in the lung bases. 3 mm subpleural nodule in the right lung base. No change since prior study. This is most likely a perifissural lymph node. In any event, by size criteria, no imaging follow-up is indicated. Hepatobiliary: No focal liver abnormality is seen. No gallstones, gallbladder wall thickening, or biliary dilatation. Pancreas: Unremarkable. No pancreatic ductal dilatation or surrounding inflammatory changes. Spleen: Normal in size without focal abnormality. Adrenals/Urinary Tract: Adrenal glands are unremarkable. Kidneys are normal, without renal calculi, focal lesion, or hydronephrosis. Bladder is unremarkable. Stomach/Bowel: Stomach and small bowel are normal in appearance. Colon is not abnormally distended. There is diffuse colonic wall thickening extending from the mid transverse colon through the descending colon and into the rectosigmoid colon. This is most likely represent infectious or inflammatory colitis. Mild pericolonic  stranding. Scattered diverticula. Appendix is normal. Vascular/Lymphatic: Aortic atherosclerosis. No enlarged abdominal or pelvic lymph nodes. Reproductive: Prostate is unremarkable. Other: Small amount of free fluid in the pelvis is likely reactive. No free air. Abdominal wall musculature appears intact. Musculoskeletal: Degenerative changes in the spine. No acute bony abnormalities. IMPRESSION: 1. Diffuse colonic wall thickening with mild pericolonic stranding extending from the transverse colon through the rectosigmoid colon most likely representing infectious or inflammatory colitis. 2. Small amount of free fluid in the pelvis is likely reactive. 3. Aortic atherosclerosis. Electronically Signed   By: Burman Nieves M.D.   On: 10/19/2022 17:11    Procedures Procedures    Medications Ordered in ED Medications  iohexol (OMNIPAQUE) 300 MG/ML solution 100 mL (100 mLs Intravenous Contrast Given 10/19/22 1632)  sodium chloride 0.9 % bolus 1,000 mL (1,000 mLs Intravenous New Bag/Given 10/19/22 1936)  ciprofloxacin (CIPRO) tablet 500 mg (500 mg Oral Given 10/19/22 2003)  metroNIDAZOLE (  FLAGYL) tablet 500 mg (500 mg Oral Given 10/19/22 2003)  potassium chloride SA (KLOR-CON M) CR tablet 20 mEq (20 mEq Oral Given 10/19/22 2036)    ED Course/ Medical Decision Making/ A&P                             Medical Decision Making This patient is a 52 y.o. male  who presents to the ED for concern of diarrhea x 2 weeks.   Differential diagnoses prior to evaluation: The emergent differential diagnosis includes, but is not limited to,  Infectious diarrhea (viral, bacterial), GI Bleed, Appendicitis, Mesenteric Ischemia, Diverticulitis, endocrine causes (adrenal, thyroid), Toxicologic, IBD. This is not an exhaustive differential.   Past Medical History / Co-morbidities / Social History: arthritis, type 2 diabetes, hyperlipidemia  Additional history: Chart reviewed. Pertinent results include: Reviewed ED visit  from yesterday in which patient had reassuring laboratory evaluation.  Was given bolus of IV fluids, and discharged with antibiotics for possible infectious diarrhea.  PCP had previously requested he stop his metformin with his diarrhea.  Per review of telephone conversation in chart, patient has follow-up GI appointment scheduled for 7/23. Reviewed stool studies from yesterday, negative c-diff and viral panel.   Physical Exam: Physical exam performed. The pertinent findings include: Initially tachycardic, afebrile.  Abdomen soft and nontender.  Lab Tests/Imaging studies: I personally interpreted labs/imaging and the pertinent results include: Leukocytosis of 12.2, normal hemoglobin.  CMP grossly unremarkable, unchanged from yesterday, except for improved glucose.  Urinalysis negative for hematuria or infection.  Normal lipase.  CT abdomen pelvis shows diffuse colonic wall thickening extending from transverse colon through the sigmoid colon, likely representing infectious or inflammatory colitis. I agree with the radiologist interpretation.  Medications: I ordered medication including IVF, potassium replacement, antibiotics.  I have reviewed the patients home medicines and have made adjustments as needed.  Tolerating p.o.   Disposition: After consideration of the diagnostic results and the patients response to treatment, I feel that emergency department workup does not suggest an emergent condition requiring admission or immediate intervention beyond what has been performed at this time. The plan is: Discharged home with change of p.o. antibiotics from azithromycin, to ciprofloxacin and Flagyl.  Not septic, otherwise clinically well-appearing.  Has follow-up appointment with GI next week.  Gave strict return precautions in the meantime.  The patient is safe for discharge and has been instructed to return immediately for worsening symptoms, change in symptoms or any other concerns.  I discussed this  case with my attending physician Dr. Renaye Rakers who cosigned this note including patient's presenting symptoms, physical exam, and planned diagnostics and interventions. Attending physician stated agreement with plan or made changes to plan which were implemented.   Final Clinical Impression(s) / ED Diagnoses Final diagnoses:  Colitis  Bloody diarrhea    Rx / DC Orders ED Discharge Orders          Ordered    ciprofloxacin (CIPRO) 500 MG tablet  Every 12 hours        10/19/22 2048    metroNIDAZOLE (FLAGYL) 500 MG tablet  2 times daily        10/19/22 2048           Portions of this report may have been transcribed using voice recognition software. Every effort was made to ensure accuracy; however, inadvertent computerized transcription errors may be present.    Jeanella Flattery 10/19/22 2053    Alvester Chou  J, MD 10/20/22 1413

## 2022-10-19 NOTE — Telephone Encounter (Signed)
Dr. Barron Alvine is out of the office this week. I have scheduled patient for an appt on Tuesday, 10/26/22 at 9:30 am with Hyacinth Meeker, PA-C. If pt can't make this appt we will need to send to MD for scheduling recommendations. Please notify patient of appt. Thanks

## 2022-10-22 ENCOUNTER — Emergency Department (HOSPITAL_COMMUNITY): Payer: BC Managed Care – PPO

## 2022-10-22 ENCOUNTER — Ambulatory Visit (INDEPENDENT_AMBULATORY_CARE_PROVIDER_SITE_OTHER): Payer: BC Managed Care – PPO | Admitting: Family Medicine

## 2022-10-22 ENCOUNTER — Inpatient Hospital Stay (HOSPITAL_COMMUNITY)
Admission: EM | Admit: 2022-10-22 | Discharge: 2022-10-27 | DRG: 385 | Disposition: A | Discharge status: Home / Self Care | Payer: BC Managed Care – PPO | Attending: Internal Medicine | Admitting: Internal Medicine

## 2022-10-22 ENCOUNTER — Other Ambulatory Visit: Payer: Self-pay

## 2022-10-22 ENCOUNTER — Encounter: Payer: Self-pay | Admitting: Family Medicine

## 2022-10-22 ENCOUNTER — Encounter (HOSPITAL_COMMUNITY): Payer: Self-pay

## 2022-10-22 DIAGNOSIS — Z823 Family history of stroke: Secondary | ICD-10-CM

## 2022-10-22 DIAGNOSIS — Z79899 Other long term (current) drug therapy: Secondary | ICD-10-CM | POA: Diagnosis not present

## 2022-10-22 DIAGNOSIS — R531 Weakness: Secondary | ICD-10-CM

## 2022-10-22 DIAGNOSIS — E1110 Type 2 diabetes mellitus with ketoacidosis without coma: Secondary | ICD-10-CM

## 2022-10-22 DIAGNOSIS — E1169 Type 2 diabetes mellitus with other specified complication: Secondary | ICD-10-CM | POA: Diagnosis present

## 2022-10-22 DIAGNOSIS — K529 Noninfective gastroenteritis and colitis, unspecified: Secondary | ICD-10-CM

## 2022-10-22 DIAGNOSIS — E876 Hypokalemia: Secondary | ICD-10-CM | POA: Diagnosis not present

## 2022-10-22 DIAGNOSIS — Z800 Family history of malignant neoplasm of digestive organs: Secondary | ICD-10-CM

## 2022-10-22 DIAGNOSIS — Z83719 Family history of colon polyps, unspecified: Secondary | ICD-10-CM

## 2022-10-22 DIAGNOSIS — R112 Nausea with vomiting, unspecified: Secondary | ICD-10-CM | POA: Diagnosis present

## 2022-10-22 DIAGNOSIS — Z83438 Family history of other disorder of lipoprotein metabolism and other lipidemia: Secondary | ICD-10-CM | POA: Diagnosis not present

## 2022-10-22 DIAGNOSIS — R197 Diarrhea, unspecified: Secondary | ICD-10-CM | POA: Diagnosis not present

## 2022-10-22 DIAGNOSIS — Z8042 Family history of malignant neoplasm of prostate: Secondary | ICD-10-CM

## 2022-10-22 DIAGNOSIS — Z8249 Family history of ischemic heart disease and other diseases of the circulatory system: Secondary | ICD-10-CM | POA: Diagnosis not present

## 2022-10-22 DIAGNOSIS — K760 Fatty (change of) liver, not elsewhere classified: Secondary | ICD-10-CM | POA: Diagnosis present

## 2022-10-22 DIAGNOSIS — K5010 Crohn's disease of large intestine without complications: Secondary | ICD-10-CM | POA: Diagnosis present

## 2022-10-22 DIAGNOSIS — K921 Melena: Secondary | ICD-10-CM | POA: Diagnosis present

## 2022-10-22 DIAGNOSIS — Z720 Tobacco use: Secondary | ICD-10-CM | POA: Diagnosis not present

## 2022-10-22 DIAGNOSIS — M1990 Unspecified osteoarthritis, unspecified site: Secondary | ICD-10-CM | POA: Diagnosis present

## 2022-10-22 DIAGNOSIS — Z7984 Long term (current) use of oral hypoglycemic drugs: Secondary | ICD-10-CM | POA: Diagnosis not present

## 2022-10-22 DIAGNOSIS — Z7985 Long-term (current) use of injectable non-insulin antidiabetic drugs: Secondary | ICD-10-CM

## 2022-10-22 DIAGNOSIS — Z833 Family history of diabetes mellitus: Secondary | ICD-10-CM | POA: Diagnosis not present

## 2022-10-22 DIAGNOSIS — R634 Abnormal weight loss: Secondary | ICD-10-CM | POA: Diagnosis not present

## 2022-10-22 DIAGNOSIS — K50111 Crohn's disease of large intestine with rectal bleeding: Secondary | ICD-10-CM | POA: Diagnosis not present

## 2022-10-22 DIAGNOSIS — E1165 Type 2 diabetes mellitus with hyperglycemia: Secondary | ICD-10-CM | POA: Diagnosis not present

## 2022-10-22 DIAGNOSIS — E860 Dehydration: Secondary | ICD-10-CM | POA: Diagnosis present

## 2022-10-22 DIAGNOSIS — Z86010 Personal history of colonic polyps: Secondary | ICD-10-CM | POA: Diagnosis not present

## 2022-10-22 DIAGNOSIS — D72829 Elevated white blood cell count, unspecified: Secondary | ICD-10-CM | POA: Diagnosis not present

## 2022-10-22 DIAGNOSIS — E119 Type 2 diabetes mellitus without complications: Secondary | ICD-10-CM | POA: Diagnosis not present

## 2022-10-22 DIAGNOSIS — E785 Hyperlipidemia, unspecified: Secondary | ICD-10-CM | POA: Diagnosis present

## 2022-10-22 DIAGNOSIS — T380X5A Adverse effect of glucocorticoids and synthetic analogues, initial encounter: Secondary | ICD-10-CM | POA: Diagnosis not present

## 2022-10-22 HISTORY — DX: Crohn's disease of large intestine with rectal bleeding: K50.111

## 2022-10-22 LAB — HEPATIC FUNCTION PANEL
ALT: 14 U/L (ref 0–44)
AST: 9 U/L — ABNORMAL LOW (ref 15–41)
Albumin: 2.8 g/dL — ABNORMAL LOW (ref 3.5–5.0)
Alkaline Phosphatase: 69 U/L (ref 38–126)
Bilirubin, Direct: <0.1 mg/dL (ref 0.0–0.2)
Total Bilirubin: 1.2 mg/dL (ref 0.3–1.2)
Total Protein: 6.5 g/dL (ref 6.5–8.1)

## 2022-10-22 LAB — BASIC METABOLIC PANEL
Anion gap: 16 — ABNORMAL HIGH (ref 5–15)
Anion gap: 11 (ref 5–15)
BUN: 12 mg/dL (ref 6–20)
BUN: 12 mg/dL (ref 6–20)
CO2: 11 mmol/L — ABNORMAL LOW (ref 22–32)
CO2: 15 mmol/L — ABNORMAL LOW (ref 22–32)
Calcium: 8.5 mg/dL — ABNORMAL LOW (ref 8.9–10.3)
Calcium: 8 mg/dL — ABNORMAL LOW (ref 8.9–10.3)
Chloride: 112 mmol/L — ABNORMAL HIGH (ref 98–111)
Chloride: 115 mmol/L — ABNORMAL HIGH (ref 98–111)
Creatinine, Ser: 1.05 mg/dL (ref 0.61–1.24)
Creatinine, Ser: 0.91 mg/dL (ref 0.61–1.24)
GFR, Estimated: >60 mL/min (ref >60)
GFR, Estimated: >60 mL/min (ref >60)
Glucose, Bld: 345 mg/dL — ABNORMAL HIGH (ref 70–99)
Glucose, Bld: 206 mg/dL — ABNORMAL HIGH (ref 70–99)
Potassium: 3.8 mmol/L (ref 3.5–5.1)
Potassium: 3.2 mmol/L — ABNORMAL LOW (ref 3.5–5.1)
Sodium: 139 mmol/L (ref 135–145)
Sodium: 141 mmol/L (ref 135–145)

## 2022-10-22 LAB — CBC WITH DIFFERENTIAL/PLATELET
Abs Immature Granulocytes: 0.15 10*3/uL — ABNORMAL HIGH (ref 0.00–0.07)
Basophils Absolute: 0.1 10*3/uL (ref 0.0–0.1)
Basophils Relative: 0 %
Eosinophils Absolute: 0.1 10*3/uL (ref 0.0–0.5)
Eosinophils Relative: 1 %
HCT: 51.1 % (ref 39.0–52.0)
Hemoglobin: 16.7 g/dL (ref 13.0–17.0)
Immature Granulocytes: 1 %
Lymphocytes Relative: 10 %
Lymphs Abs: 1.5 10*3/uL (ref 0.7–4.0)
MCH: 30.4 pg (ref 26.0–34.0)
MCHC: 32.7 g/dL (ref 30.0–36.0)
MCV: 92.9 fL (ref 80.0–100.0)
Monocytes Absolute: 2.6 10*3/uL — ABNORMAL HIGH (ref 0.1–1.0)
Monocytes Relative: 18 %
Neutro Abs: 10.4 10*3/uL — ABNORMAL HIGH (ref 1.7–7.7)
Neutrophils Relative %: 70 %
Platelets: 443 10*3/uL — ABNORMAL HIGH (ref 150–400)
RBC: 5.5 MIL/uL (ref 4.22–5.81)
RDW: 13.2 % (ref 11.5–15.5)
WBC: 14.8 10*3/uL — ABNORMAL HIGH (ref 4.0–10.5)
nRBC: 0 % (ref 0.0–0.2)

## 2022-10-22 LAB — CBG MONITORING, ED
Glucose-Capillary: 302 mg/dL — ABNORMAL HIGH (ref 70–99)
Glucose-Capillary: 263 mg/dL — ABNORMAL HIGH (ref 70–99)
Glucose-Capillary: 204 mg/dL — ABNORMAL HIGH (ref 70–99)

## 2022-10-22 LAB — URINALYSIS, ROUTINE W REFLEX MICROSCOPIC
Bacteria, UA: NONE SEEN
Bilirubin Urine: NEGATIVE
Glucose, UA: >=500 mg/dL — AB
Hgb urine dipstick: NEGATIVE
Ketones, ur: 80 mg/dL — AB
Leukocytes,Ua: NEGATIVE
Nitrite: NEGATIVE
Protein, ur: NEGATIVE mg/dL
Specific Gravity, Urine: 1.025 (ref 1.005–1.030)
pH: 5 (ref 5.0–8.0)

## 2022-10-22 LAB — BLOOD GAS, VENOUS
Acid-base deficit: 14.3 mmol/L — ABNORMAL HIGH (ref 0.0–2.0)
Bicarbonate: 13.1 mmol/L — ABNORMAL LOW (ref 20.0–28.0)
O2 Saturation: 44.7 %
Patient temperature: 36.1
pCO2, Ven: 34 mmHg — ABNORMAL LOW (ref 44–60)
pH, Ven: 7.19 — CL (ref 7.25–7.43)
pO2, Ven: <31 mmHg — CL (ref 32–45)

## 2022-10-22 LAB — LACTIC ACID, PLASMA
Lactic Acid, Venous: 2.1 mmol/L (ref 0.5–1.9)
Lactic Acid, Venous: 2.2 mmol/L (ref 0.5–1.9)

## 2022-10-22 LAB — LIPASE, BLOOD: Lipase: 22 U/L (ref 11–51)

## 2022-10-22 LAB — MRSA NEXT GEN BY PCR, NASAL: MRSA by PCR Next Gen: NOT DETECTED

## 2022-10-22 LAB — BETA-HYDROXYBUTYRIC ACID: Beta-Hydroxybutyric Acid: 6.44 mmol/L — ABNORMAL HIGH (ref 0.05–0.27)

## 2022-10-22 LAB — GLUCOSE, CAPILLARY
Glucose-Capillary: 166 mg/dL — ABNORMAL HIGH (ref 70–99)
Glucose-Capillary: 200 mg/dL — ABNORMAL HIGH (ref 70–99)

## 2022-10-22 MED ORDER — MORPHINE SULFATE (PF) 2 MG/ML IV SOLN: 1.0000 mg | INTRAVENOUS | Status: DC | PRN

## 2022-10-22 MED ORDER — ONDANSETRON HCL 4 MG/2ML IJ SOLN
4.0000 mg | Freq: Four times a day (QID) | INTRAMUSCULAR | Status: DC | PRN
  Administered 2022-10-23 – 2022-10-24 (×2): 4 mg via INTRAVENOUS
  Filled 2022-10-22 (×2): qty 2

## 2022-10-22 MED ORDER — POTASSIUM CHLORIDE 10 MEQ/100ML IV SOLN
10.0000 meq | INTRAVENOUS | Status: AC
  Administered 2022-10-22 (×2): 10 meq via INTRAVENOUS
  Filled 2022-10-22 (×2): qty 100

## 2022-10-22 MED ORDER — SODIUM CHLORIDE 0.9 % IV SOLN: 500.0000 mg | Freq: Once | INTRAVENOUS | Status: DC

## 2022-10-22 MED ORDER — CIPROFLOXACIN IN D5W 400 MG/200ML IV SOLN
400.0000 mg | Freq: Once | INTRAVENOUS | Status: AC
  Administered 2022-10-22: 400 mg via INTRAVENOUS
  Filled 2022-10-22: qty 200

## 2022-10-22 MED ORDER — SODIUM CHLORIDE 0.9 % IV SOLN
2.0000 g | INTRAVENOUS | Status: DC
  Administered 2022-10-23: 2 g via INTRAVENOUS
  Filled 2022-10-22: qty 20

## 2022-10-22 MED ORDER — CHLORHEXIDINE GLUCONATE CLOTH 2 % EX PADS
6.0000 | MEDICATED_PAD | Freq: Every day | CUTANEOUS | Status: DC
  Administered 2022-10-23 (×2): 6 via TOPICAL

## 2022-10-22 MED ORDER — ACETAMINOPHEN 325 MG PO TABS: 650.0000 mg | ORAL_TABLET | Freq: Four times a day (QID) | ORAL | Status: DC | PRN

## 2022-10-22 MED ORDER — SODIUM CHLORIDE 0.9 % IV BOLUS
1000.0000 mL | Freq: Once | INTRAVENOUS | Status: AC
  Administered 2022-10-22: 1000 mL via INTRAVENOUS

## 2022-10-22 MED ORDER — METRONIDAZOLE 500 MG/100ML IV SOLN
500.0000 mg | Freq: Once | INTRAVENOUS | Status: AC
  Administered 2022-10-22: 500 mg via INTRAVENOUS
  Filled 2022-10-22: qty 100

## 2022-10-22 MED ORDER — LACTATED RINGERS IV SOLN: INTRAVENOUS | Status: DC

## 2022-10-22 MED ORDER — DEXTROSE 50 % IV SOLN: 0.0000 mL | INTRAVENOUS | Status: DC | PRN

## 2022-10-22 MED ORDER — INSULIN REGULAR(HUMAN) IN NACL 100-0.9 UT/100ML-% IV SOLN
INTRAVENOUS | Status: DC
  Administered 2022-10-22: 18 [IU]/h via INTRAVENOUS
  Administered 2022-10-23: 2.4 [IU]/h via INTRAVENOUS
  Filled 2022-10-22 (×2): qty 100

## 2022-10-22 MED ORDER — METRONIDAZOLE 500 MG/100ML IV SOLN: 500.0000 mg | Freq: Two times a day (BID) | INTRAVENOUS | Status: DC

## 2022-10-22 MED ORDER — LACTATED RINGERS IV BOLUS
1000.0000 mL | Freq: Once | INTRAVENOUS | Status: AC
  Administered 2022-10-22: 1000 mL via INTRAVENOUS

## 2022-10-22 MED ORDER — IOHEXOL 300 MG/ML  SOLN
100.0000 mL | Freq: Once | INTRAMUSCULAR | Status: AC | PRN
  Administered 2022-10-22: 100 mL via INTRAVENOUS

## 2022-10-22 MED ORDER — DEXTROSE IN LACTATED RINGERS 5 % IV SOLN: INTRAVENOUS | Status: DC

## 2022-10-22 NOTE — H&P (Signed)
History and Physical    Richard Carroll OZH:[id number] DOB: 1971/01/26 DOA: 10/22/2022  PCP: Kristian Covey, MD  Patient coming from: Home  I have personally briefly reviewed patient's old medical records in [facility]  Chief Complaint: Diarrhea, nausea, vomiting  HPI: Richard Carroll is a 52 y.o. male with medical history significant for T2DM, HLD who presents to the ED for evaluation of diarrhea and vomiting.  Patient initially seen in the ED 7/15 for blood in his stool that began on 7/4.  C. difficile and GI pathogen panels were negative.  He was given IV fluids and short course of azithromycin is for possible infectious diarrhea.  He was seen again 7/16.  CT showed changes consistent with infectious or inflammatory colitis.  Antibiotics were changed to ciprofloxacin and Flagyl.  Patient states that he has not been able to keep down the antibiotics.  He says that every time that he eats or drinks runs through very quickly.  He has been having continued diarrhea.  He has been seeing some red blood mixed in the stool, per wife this is improving today.  He has had some abdominal pain.  He has been having nausea and dry heaves without episode of nonbloody emesis.  He has been feeling dehydrated and generally weak.  ED Course  Labs/Imaging on admission: I have personally reviewed following labs and imaging studies.  Initial vitals showed BP 118/86, pulse 120, RR 16, temp 97.5 F, SpO2 98% on room air.  Labs show sodium 139, potassium 3.8, bicarb 11, BUN 12, creatinine 1.05, serum glucose 345, anion gap 16, beta hydroxybutyrate 6.44, VBG pH 7.19 and pCO2 34, WBC 14.8, hemoglobin 16.7, platelets 443,000, lactic acid 2.1.  CT abdomen/pelvis with contrast showed diffuse colonic wall thickening/edema involving the transverse, descending, and rectosigmoid colon similar to prior study.  Changes likely to represent infectious or inflammatory colitis.  No evidence of bowel obstruction.   Fatty infiltration of the liver noted.  Patient was given 1 L normal saline, IV K 10 mEq x 2, IV ciprofloxacin, Flagyl, azithromycin.  He was started on insulin drip.  The hospitalist service was consulted to admit for further evaluation and management.  Review of Systems: All systems reviewed and are negative except as documented in history of present illness above.   Past Medical History:  Diagnosis Date   Arthritis    Diabetes mellitus type II    DIABETES MELLITUS, TYPE II 08/18/2009   on meds   Hay fever    with allergies   Hyperlipidemia    HYPERLIPIDEMIA 08/18/2009   on meds   Seasonal allergies     Past Surgical History:  Procedure Laterality Date   FINGER SURGERY Right    ring finger   KNEE SURGERY Right 2005    Social History:  reports that he has never smoked. His smokeless tobacco use includes chew. He reports that he does not currently use alcohol. He reports that he does not use drugs.  No Known Allergies  Family History  Problem Relation Age of Onset   Hyperlipidemia Mother    Stroke Mother    Hypertension Mother    Diabetes Mother    Prostate cancer Father 71       2004   Colon cancer Father 58       2013   Heart disease Father    Diabetes Maternal Grandmother    Alcohol abuse Maternal Grandfather    Colon cancer Paternal Uncle    Prostate  cancer Paternal Uncle    Colon polyps Neg Hx    Esophageal cancer Neg Hx    Stomach cancer Neg Hx    Rectal cancer Neg Hx      Prior to Admission medications   Medication Sig Start Date End Date Taking? Authorizing Provider  ciprofloxacin (CIPRO) 500 MG tablet Take 1 tablet (500 mg total) by mouth every 12 (twelve) hours. 10/19/22   Roemhildt, Lorin T, PA-C  Dulaglutide (TRULICITY) 0.75 MG/0.5ML SOPN Inject 0.75 mg into the skin once a week. 10/13/22   Burchette, Elberta Fortis, MD  glimepiride (AMARYL) 4 MG tablet TAKE 1 TABLET BY MOUTH EVERY DAY BEFORE BREAKFAST 07/12/22   Burchette, Elberta Fortis, MD  JARDIANCE 25 MG  TABS tablet TAKE 1 TABLET BY MOUTH EVERY DAY BEFORE BREAKFAST 08/06/22   Burchette, Elberta Fortis, MD  lidocaine (HM LIDOCAINE PATCH) 4 % Place 1 patch onto the skin daily. 03/04/22   Lonell Grandchild, MD  metFORMIN (GLUCOPHAGE) 500 MG tablet TAKE 2 TABLETS (1,000 MG TOTAL) BY MOUTH 2 (TWO) TIMES DAILY WITH A MEAL. 09/24/22   Burchette, Elberta Fortis, MD  metroNIDAZOLE (FLAGYL) 500 MG tablet Take 1 tablet (500 mg total) by mouth 2 (two) times daily. 10/19/22   Roemhildt, Lorin T, PA-C  Multiple Vitamins-Minerals (MULTIVITAMIN) LIQD Take 1 tablet by mouth daily at 6 (six) AM.    [provider]  sildenafil (VIAGRA) 100 MG tablet Take 0.5-1 tablets (50-100 mg total) by mouth daily as needed for erectile dysfunction. 01/19/19   Burchette, Elberta Fortis, MD  simvastatin (ZOCOR) 80 MG tablet TAKE 1/2 TABLET BY MOUTH EVERY DAY 05/20/22   Kristian Covey, MD    Physical Exam: Vitals:   10/22/22 2100 10/22/22 2128 10/22/22 2130 10/22/22 2153  BP: 120/80  119/76   Pulse: (!) 122 (!) 120 (!) 118 (!) 116  Resp: (!) 22 (!) 21 16 (!) 23  Temp:  97.7 F (36.5 C)    TempSrc:      SpO2: 99% 97% 98% 99%  Height:       Constitutional: Resting in bed with head elevated, appears fatigued , calm Eyes: EOMI, lids and conjunctivae normal ENMT: Mucous membranes are dry. Posterior pharynx clear of any exudate or lesions.Normal dentition.  Neck: normal, supple, no masses. Respiratory: clear to auscultation bilaterally, no wheezing, no crackles. Normal respiratory effort. No accessory muscle use.  Cardiovascular: Tachycardic, no murmurs / rubs / gallops. No extremity edema. 2+ pedal pulses. Abdomen: Mild epigastric tenderness, no masses palpated.  Musculoskeletal: no clubbing / cyanosis. No joint deformity upper and lower extremities. Good ROM, no contractures. Normal muscle tone.  Skin: no rashes, lesions, ulcers. No induration Neurologic: Sensation intact. Strength 5/5 in all 4.  Psychiatric: Normal judgment and  insight. Alert and oriented x 3. Normal mood.   EKG: Personally reviewed. Sinus tachycardia, rate 110, no acute ischemic changes.  Assessment/Plan Principal Problem:   Diabetic ketoacidosis associated with type 2 diabetes mellitus (HCC) Active Problems:   Colitis   Hyperlipidemia associated with type 2 diabetes mellitus (HCC)   Richard Carroll is a 52 y.o. male with medical history significant for T2DM, HLD who is admitted with DKA and colitis.  Assessment and Plan: * Diabetic ketoacidosis associated with type 2 diabetes mellitus (HCC) Likely triggered by colitis.  Holding home metformin, Jardiance, Amaryl, Trulicity. -Continue insulin infusion per protocol -Continue IV fluids as ordered -Serial BMET, transition to subcutaneous insulin when able  Colitis Infectious versus inflammatory colitis.  WBC elevated at  14.8.  Failed outpatient antibiotics.  C. difficile and GI pathogen panels were negative on 7/15. -Continue IV ceftriaxone and Flagyl -Continue IV fluid hydration  Hyperlipidemia associated with type 2 diabetes mellitus (HCC) Resume statin when tolerating orals.  DVT prophylaxis: SCDs Start: 10/22/22 2214 Code Status: Full code, confirmed with patient on admission Family Communication: Spouse and mother at bedside Disposition Plan: From home and likely discharge to home pending clinical progress Consults called: None Severity of Illness: The appropriate patient status for this patient is INPATIENT. Inpatient status is judged to be reasonable and necessary in order to provide the required intensity of service to ensure the patient's safety. The patient's presenting symptoms, physical exam findings, and initial radiographic and laboratory data in the context of their chronic comorbidities is felt to place them at high risk for further clinical deterioration. Furthermore, it is not anticipated that the patient will be medically stable for discharge from the hospital within 2  midnights of admission.   * I certify that at the point of admission it is my clinical judgment that the patient will require inpatient hospital care spanning beyond 2 midnights from the point of admission due to high intensity of service, high risk for further deterioration and high frequency of surveillance required.Darreld Mclean MD [facility]  If 7PM-7AM, please contact night-coverage [email or web address]  10/22/2022, 10:21 PM

## 2022-10-22 NOTE — Assessment & Plan Note (Signed)
Resume statin when tolerating orals.

## 2022-10-22 NOTE — Assessment & Plan Note (Signed)
Likely triggered by colitis.  Holding home metformin, Jardiance, Amaryl, Trulicity. -Continue insulin infusion per protocol -Continue IV fluids as ordered -Serial BMET, transition to subcutaneous insulin when able

## 2022-10-22 NOTE — Progress Notes (Unsigned)
Established Patient Office Visit  Subjective   Patient ID: Richard Carroll, male    DOB: 1970-05-28  Age: 52 y.o. MRN: [id number]  Chief Complaint  Patient presents with   Referral    HPI  {History (Optional):23778} Richard Carroll is seen accompanied by his wife with progressive weakness and ongoing bloody diarrhea.  He had multiple visits including here and twice in the ER this past week.    had been seen here July 10 with bright red blood per rectum and roughly 1 week history of relatively mild diarrhea.  We question viral etiology regarding the diarrhea and he did not appear weekend or dehydrated at that time.  Colonoscopy 10/22.  He has some ongoing bright red blood per rectum and went to the ER on the 15th and was having some abdominal cramping at that time.  No recent travels.  No fever.  We had recently stopped his metformin and he was recently started back on Trulicity.  Abs on the 15th included C. difficile screen, gastrointestinal pathogen panel, lipase, CMP, CBC.  C. difficile and GI panel negative.  Lipase normal.  White blood count 8.9 thousand with hemoglobin 14.2.  Sodium 132 with glucose of 563.  He then returned to the ER on the 16th with persistent diarrhea.  Repeat CBC white count 12.2 thousand with hemoglobin 14.0.  CMP significant for potassium 3.3 and albumin 2.7.  CT abdomen pelvis revealed diffuse colonic wall thickening and mild pericolonic stranding extending from transverse colon through the rectosigmoid colon most likely representing infectious or inflammatory colitis.  Patient was discharged home on Flagyl and Cipro but has had some progressive weakness and nausea and inability to take the antibiotics especially the metronidazole.  He also has been holding his Trulicity for fear of worsening nausea.  Feels extremely weak at this time and unable to keep down fluids.  Having ongoing bloody diarrhea.  He had tremendous weight loss of 17 pounds since he was here on the 10th  just 9 days ago  Past Medical History:  Diagnosis Date   Arthritis    Diabetes mellitus type II    DIABETES MELLITUS, TYPE II 08/18/2009   on meds   Hay fever    with allergies   Hyperlipidemia    HYPERLIPIDEMIA 08/18/2009   on meds   Seasonal allergies    Past Surgical History:  Procedure Laterality Date   FINGER SURGERY Right    ring finger   KNEE SURGERY Right 2005    reports that he has never smoked. His smokeless tobacco use includes chew. He reports that he does not currently use alcohol. He reports that he does not use drugs. family history includes Alcohol abuse in his maternal grandfather; Colon cancer in his paternal uncle; Colon cancer (age of onset: 65) in his father; Diabetes in his maternal grandmother and mother; Heart disease in his father; Hyperlipidemia in his mother; Hypertension in his mother; Prostate cancer in his paternal uncle; Prostate cancer (age of onset: 17) in his father; Stroke in his mother. No Known Allergies  Review of Systems  Constitutional:  Positive for weight loss. Negative for chills and fever.  Respiratory:  Negative for cough.   Cardiovascular:  Negative for chest pain.  Gastrointestinal:  Positive for abdominal pain, blood in stool, diarrhea and nausea. Negative for heartburn and melena.  Genitourinary:  Negative for dysuria and hematuria.      Objective:     BP 118/86 (BP Location: Left Arm, Patient Position: Sitting, Cuff  Size: Normal)   Pulse (!) 120   Temp (!) 97.5 F (36.4 C) (Oral)   Ht 5\' 10"  (1.778 m)   Wt 180 lb 4.8 oz (81.8 kg)   SpO2 98%   BMI 25.87 kg/m  {Vitals History (Optional):23777}  Physical Exam Vitals reviewed.  Constitutional:      Appearance: He is ill-appearing.     Comments: Weak and ill-appearing 52 year old male  HENT:     Mouth/Throat:     Comments: Pharynx is slightly dry. Cardiovascular:     Comments: Regular rhythm with rate increased about 120 Pulmonary:     Effort: Pulmonary effort is  normal.     Breath sounds: Normal breath sounds.  Abdominal:     General: Bowel sounds are normal. There is no distension.     Palpations: Abdomen is soft.     Comments: Mild tenderness left lower quadrant to deep palpation.  No guarding.  Musculoskeletal:     Right lower leg: No edema.     Left lower leg: No edema.      No results found for any visits on 10/22/22.  {Labs (Optional):23779}  The 10-year ASCVD risk score (Arnett DK, et al., 2019) is: 3.7%    Assessment & Plan:   52 year old male with history of type 2 diabetes and hyperlipidemia presenting with 2+ week history of diarrhea and bloody stools with CT abdomen and pelvis showing nonspecific colitis findings.  Patient has had some ongoing severe nausea, intermittent vomiting, inability keep down fluids.  Appears to be significantly dehydrated at this time.  -We discussed sending back to the ER for likely admission. -He has pending GI consult for next week -Will likely need IV fluids and probably IV antibiotics and GI consult hospital  Evelena Peat, MD

## 2022-10-22 NOTE — Patient Instructions (Signed)
Go straight to ER at [facility].

## 2022-10-22 NOTE — Hospital Course (Signed)
Richard Carroll is a 52 y.o. male with medical history significant for T2DM, HLD who is admitted with DKA and colitis.

## 2022-10-22 NOTE — ED Notes (Signed)
Patient transported to CT 

## 2022-10-22 NOTE — Assessment & Plan Note (Signed)
Infectious versus inflammatory colitis.  WBC elevated at 14.8.  Failed outpatient antibiotics.  C. difficile and GI pathogen panels were negative on 7/15. -Continue IV ceftriaxone and Flagyl -Continue IV fluid hydration

## 2022-10-22 NOTE — ED Triage Notes (Signed)
Pt arrived from pcp appt. States was sent for IV hydration and reevaluation. Patient states he was here a few days ago, placed on antibiotics  and d/c with colitis. States has not stop having bloody diarrhea and vomiting. Pt also reports intermittently feeling dizzy.Denies cp,sob, or any other symptoms at this time.

## 2022-10-22 NOTE — ED Provider Notes (Signed)
[place] EMERGENCY DEPARTMENT AT [facility] Provider Note   CSN: [id number] Arrival date & time: 10/22/22  1634    History Chief Complaint  Patient presents with   Vomiting   Weakness     Weakness  Richard Carroll is a 52 y.o. male presenting for vomiting and generalized weakness.  Patient was sent from PCPs office to the ED because he looks fatigued and needed some IV hydration and reevaluation. Patient was recently in the ED 10/19/2022, was diagnosed with colitis, discharged on antibiotic.  Feeling better. He is still reporting watery stools with clots in it. Vomit is nonbilious, nonbloody.  He reports mild abdominal pain, denies fevers cough, dyspnea.  Patient's recorded medical, surgical, social, medication list and allergies were reviewed in the Snapshot window as part of the initial history.   Review of Systems   Review of Systems  Neurological:  Positive for weakness.    Physical Exam Updated Vital Signs BP 134/76   Pulse 86   Temp 98 F (36.7 C) (Oral)   Resp 20   Ht 5\' 10"  (1.778 m)   Wt 83.2 kg   SpO2 97%   BMI 26.32 kg/m  Physical Exam General: Pleasant, well-appearing middle-age laying in bed. No acute distress. CV: RRR. No murmurs, rubs, or gallops. No LE edema Pulmonary: Lungs CTAB. Normal effort. No wheezing or rales. Abdominal: Soft, nontender, nondistended. Normal bowel sounds. Extremities: Palpable pulses. Normal ROM. Skin: Warm and dry. No obvious rash or lesions. Neuro: A&Ox3. Moves all extremities. Normal sensation. No focal deficit. Psych: Normal mood and affect   ED Course/ Medical Decision Making/ A&P    Procedures Procedures   Medications Ordered in ED Medications  lactated ringers infusion ( Intravenous Infusion Verify 10/24/22 1300)  dextrose 50 % solution 0-50 mL (has no administration in time range)  ondansetron (ZOFRAN) injection 4 mg (4 mg Intravenous Given 10/23/22 0121)  acetaminophen (TYLENOL) tablet 650  mg (has no administration in time range)  morphine (PF) 2 MG/ML injection 1 mg (has no administration in time range)  Oral care mouth rinse (has no administration in time range)  mesalamine (LIALDA) EC tablet 2.4 g (2.4 g Oral Given 10/24/22 0857)  Chlorhexidine Gluconate Cloth 2 % PADS 6 each (6 each Topical Given 10/24/22 1206)  methylPREDNISolone sodium succinate (SOLU-MEDROL) 40 mg/mL injection 40 mg (40 mg Intravenous Given 10/24/22 1205)  insulin glargine-yfgn (SEMGLEE) injection 10 Units (has no administration in time range)  insulin aspart (novoLOG) injection 0-5 Units (has no administration in time range)  insulin aspart (novoLOG) injection 0-20 Units (7 Units Subcutaneous Given 10/24/22 1205)  peg 3350 powder (MOVIPREP) kit 100 g (has no administration in time range)  peg 3350 powder (MOVIPREP) kit 100 g (has no administration in time range)  metoCLOPramide (REGLAN) injection 10 mg (has no administration in time range)  metoCLOPramide (REGLAN) injection 10 mg (has no administration in time range)  sodium chloride 0.9 % bolus 1,000 mL (0 mLs Intravenous Stopped 10/22/22 1914)  potassium chloride 10 mEq in 100 mL IVPB (0 mEq Intravenous Stopped 10/22/22 2149)  ciprofloxacin (CIPRO) IVPB 400 mg (0 mg Intravenous Stopped 10/22/22 2025)  metroNIDAZOLE (FLAGYL) IVPB 500 mg (0 mg Intravenous Stopped 10/22/22 2149)  iohexol (OMNIPAQUE) 300 MG/ML solution 100 mL (100 mLs Intravenous Contrast Given 10/22/22 1944)  lactated ringers bolus 1,000 mL (0 mLs Intravenous Stopped 10/23/22 0851)  potassium chloride SA (KLOR-CON M) CR tablet 40 mEq (40 mEq Oral Given 10/23/22 0434)  potassium chloride SA (  KLOR-CON M) CR tablet 40 mEq (40 mEq Oral Given 10/23/22 2156)  potassium chloride SA (KLOR-CON M) CR tablet 40 mEq (40 mEq Oral Given 10/24/22 0658)    Medical Decision Making:    Richard Carroll is a 52 y.o. male who presented to the ED today with generalized weakness and vomiting detailed above.      Patient placed on continuous vitals and telemetry monitoring while in ED which was reviewed periodically.   Complete initial physical exam performed, notably the patient  was stable.  He is afebrile, tachycardic. He feels fatigue with dry mucous membrane.  Abdomen soft and nontender. Reviewed and confirmed nursing documentation for past medical history, family history, social history.    Initial Assessment:   Potential differential diagnosis includes colitis versus DKA vs diverticulitis. He presented with similar symptoms on 10/19/2022.  CT abdomen pelvis at that time showed colonic wall thickening extending from transverse colon through the sigmoid colon likely representing infectious versus inflammatory colitis.  He is afebrile, tachycardic, with worsening leukocytosis on labs. We will need VBG and urine ketones to rule out DKA.  It is also possible that with an abdominal pain history in the context of colitis.  He reports not been able to tolerate the antibiotic due to ongoing vomiting.  Will also repeat CT of the abdomen and pelvis to rule out worsening colitis.  IV fluids VBG Screening labs including CBC and Metabolic panel to evaluate for infectious or metabolic etiology of disease.  Urinalysis with reflex culture ordered to evaluate for UTI or relevant urologic/nephrologic pathology.  EKG to evaluate for cardiac pathology. Objective evaluation as below reviewed with plan for close reassessment  Initial Study Results:   Laboratory  All laboratory results reviewed without evidence of clinically relevant pathology.   Exceptions include: WBC is 14.8  EKG EKG was reviewed independently. Rate, rhythm, axis, intervals all examined and without medically relevant abnormality. ST segments without concerns for elevations.    Radiology  All images reviewed independently. Agree with radiology report at this time.   No results found.   Consults:  Case discussed with hospitalist.    Reassessment and Plan:   Capillary blood 302.  VBG shows pH 7.19 bicarb 13 pCO2 34.  These findings are consistent with DKA.  DKA was treated with IV fluids, potassium replenishment and IV insulin.  Repeat CT ABD showed evidence of colitis unchanged from prior.    He was restarted on IV azithromycin, IV Cipro, IV Flagyl.  Clinical Impression:  1. Diabetic ketoacidosis without coma associated with type 2 diabetes mellitus (HCC)   2. Colitis      Admit   Final Clinical Impression(s) / ED Diagnoses Final diagnoses:  Diabetic ketoacidosis without coma associated with type 2 diabetes mellitus (HCC)  Colitis    Rx / DC Orders ED Discharge Orders     None         Laretta Bolster, MD 10/24/22 1427    Horton, Clabe Seal, DO 10/24/22 1526

## 2022-10-23 ENCOUNTER — Encounter (HOSPITAL_COMMUNITY): Payer: Self-pay | Admitting: Internal Medicine

## 2022-10-23 DIAGNOSIS — E119 Type 2 diabetes mellitus without complications: Secondary | ICD-10-CM

## 2022-10-23 DIAGNOSIS — R112 Nausea with vomiting, unspecified: Secondary | ICD-10-CM

## 2022-10-23 DIAGNOSIS — E1110 Type 2 diabetes mellitus with ketoacidosis without coma: Secondary | ICD-10-CM | POA: Diagnosis not present

## 2022-10-23 DIAGNOSIS — K529 Noninfective gastroenteritis and colitis, unspecified: Secondary | ICD-10-CM

## 2022-10-23 DIAGNOSIS — K921 Melena: Secondary | ICD-10-CM

## 2022-10-23 LAB — GLUCOSE, CAPILLARY
Glucose-Capillary: 134 mg/dL — ABNORMAL HIGH (ref 70–99)
Glucose-Capillary: 142 mg/dL — ABNORMAL HIGH (ref 70–99)
Glucose-Capillary: 144 mg/dL — ABNORMAL HIGH (ref 70–99)
Glucose-Capillary: 150 mg/dL — ABNORMAL HIGH (ref 70–99)
Glucose-Capillary: 155 mg/dL — ABNORMAL HIGH (ref 70–99)
Glucose-Capillary: 154 mg/dL — ABNORMAL HIGH (ref 70–99)
Glucose-Capillary: 149 mg/dL — ABNORMAL HIGH (ref 70–99)
Glucose-Capillary: 131 mg/dL — ABNORMAL HIGH (ref 70–99)
Glucose-Capillary: 154 mg/dL — ABNORMAL HIGH (ref 70–99)
Glucose-Capillary: 121 mg/dL — ABNORMAL HIGH (ref 70–99)
Glucose-Capillary: 138 mg/dL — ABNORMAL HIGH (ref 70–99)
Glucose-Capillary: 108 mg/dL — ABNORMAL HIGH (ref 70–99)
Glucose-Capillary: 169 mg/dL — ABNORMAL HIGH (ref 70–99)
Glucose-Capillary: 162 mg/dL — ABNORMAL HIGH (ref 70–99)

## 2022-10-23 LAB — BASIC METABOLIC PANEL
Anion gap: 7 (ref 5–15)
Anion gap: 5 (ref 5–15)
Anion gap: 5 (ref 5–15)
Anion gap: 7 (ref 5–15)
BUN: 11 mg/dL (ref 6–20)
BUN: 11 mg/dL (ref 6–20)
BUN: 11 mg/dL (ref 6–20)
BUN: 10 mg/dL (ref 6–20)
CO2: 18 mmol/L — ABNORMAL LOW (ref 22–32)
CO2: 18 mmol/L — ABNORMAL LOW (ref 22–32)
CO2: 16 mmol/L — ABNORMAL LOW (ref 22–32)
CO2: 19 mmol/L — ABNORMAL LOW (ref 22–32)
Calcium: 7.8 mg/dL — ABNORMAL LOW (ref 8.9–10.3)
Calcium: 7.7 mg/dL — ABNORMAL LOW (ref 8.9–10.3)
Calcium: 7.6 mg/dL — ABNORMAL LOW (ref 8.9–10.3)
Calcium: 7.7 mg/dL — ABNORMAL LOW (ref 8.9–10.3)
Chloride: 115 mmol/L — ABNORMAL HIGH (ref 98–111)
Chloride: 116 mmol/L — ABNORMAL HIGH (ref 98–111)
Chloride: 116 mmol/L — ABNORMAL HIGH (ref 98–111)
Chloride: 113 mmol/L — ABNORMAL HIGH (ref 98–111)
Creatinine, Ser: 0.75 mg/dL (ref 0.61–1.24)
Creatinine, Ser: 0.5 mg/dL — ABNORMAL LOW (ref 0.61–1.24)
Creatinine, Ser: 0.71 mg/dL (ref 0.61–1.24)
Creatinine, Ser: 0.68 mg/dL (ref 0.61–1.24)
GFR, Estimated: >60 mL/min (ref >60)
GFR, Estimated: >60 mL/min (ref >60)
GFR, Estimated: >60 mL/min (ref >60)
GFR, Estimated: >60 mL/min (ref >60)
Glucose, Bld: 157 mg/dL — ABNORMAL HIGH (ref 70–99)
Glucose, Bld: 150 mg/dL — ABNORMAL HIGH (ref 70–99)
Glucose, Bld: 141 mg/dL — ABNORMAL HIGH (ref 70–99)
Glucose, Bld: 177 mg/dL — ABNORMAL HIGH (ref 70–99)
Potassium: 3.1 mmol/L — ABNORMAL LOW (ref 3.5–5.1)
Potassium: 3 mmol/L — ABNORMAL LOW (ref 3.5–5.1)
Potassium: 3.4 mmol/L — ABNORMAL LOW (ref 3.5–5.1)
Potassium: 3.2 mmol/L — ABNORMAL LOW (ref 3.5–5.1)
Sodium: 140 mmol/L (ref 135–145)
Sodium: 139 mmol/L (ref 135–145)
Sodium: 137 mmol/L (ref 135–145)
Sodium: 139 mmol/L (ref 135–145)

## 2022-10-23 LAB — MAGNESIUM: Magnesium: 1.8 mg/dL (ref 1.7–2.4)

## 2022-10-23 LAB — BETA-HYDROXYBUTYRIC ACID
Beta-Hydroxybutyric Acid: 1.19 mmol/L — ABNORMAL HIGH (ref 0.05–0.27)
Beta-Hydroxybutyric Acid: 0.31 mmol/L — ABNORMAL HIGH (ref 0.05–0.27)

## 2022-10-23 LAB — HIV ANTIBODY (ROUTINE TESTING W REFLEX): HIV Screen 4th Generation wRfx: NONREACTIVE

## 2022-10-23 LAB — CBC
HCT: 42.9 % (ref 39.0–52.0)
Hemoglobin: 14.1 g/dL (ref 13.0–17.0)
MCH: 30.3 pg (ref 26.0–34.0)
MCHC: 32.9 g/dL (ref 30.0–36.0)
MCV: 92.3 fL (ref 80.0–100.0)
Platelets: 419 10*3/uL — ABNORMAL HIGH (ref 150–400)
RBC: 4.65 MIL/uL (ref 4.22–5.81)
RDW: 13.2 % (ref 11.5–15.5)
WBC: 13.1 10*3/uL — ABNORMAL HIGH (ref 4.0–10.5)
nRBC: 0 % (ref 0.0–0.2)

## 2022-10-23 LAB — HEMOGLOBIN A1C
Hgb A1c MFr Bld: 12.1 % — ABNORMAL HIGH (ref 4.8–5.6)
Mean Plasma Glucose: 300.57 mg/dL

## 2022-10-23 MED ORDER — INSULIN ASPART 100 UNIT/ML IJ SOLN
0.0000 [IU] | Freq: Three times a day (TID) | INTRAMUSCULAR | Status: DC
  Administered 2022-10-23 – 2022-10-24 (×2): 2 [IU] via SUBCUTANEOUS

## 2022-10-23 MED ORDER — ORAL CARE MOUTH RINSE: 15.0000 mL | OROMUCOSAL | Status: DC | PRN

## 2022-10-23 MED ORDER — MESALAMINE 1.2 G PO TBEC
2.4000 g | DELAYED_RELEASE_TABLET | Freq: Two times a day (BID) | ORAL | Status: DC
  Administered 2022-10-23 – 2022-10-27 (×8): 2.4 g via ORAL
  Filled 2022-10-23 (×9): qty 2

## 2022-10-23 MED ORDER — CHLORHEXIDINE GLUCONATE CLOTH 2 % EX PADS
6.0000 | MEDICATED_PAD | Freq: Every day | CUTANEOUS | Status: DC
  Administered 2022-10-24 – 2022-10-26 (×3): 6 via TOPICAL

## 2022-10-23 MED ORDER — METRONIDAZOLE 500 MG/100ML IV SOLN: 500.0000 mg | Freq: Two times a day (BID) | INTRAVENOUS | Status: DC

## 2022-10-23 MED ORDER — INSULIN GLARGINE-YFGN 100 UNIT/ML ~~LOC~~ SOLN
10.0000 [IU] | Freq: Every day | SUBCUTANEOUS | Status: DC
  Administered 2022-10-23 – 2022-10-24 (×2): 10 [IU] via SUBCUTANEOUS
  Filled 2022-10-23 (×2): qty 0.1

## 2022-10-23 MED ORDER — POTASSIUM CHLORIDE CRYS ER 20 MEQ PO TBCR
40.0000 meq | EXTENDED_RELEASE_TABLET | Freq: Once | ORAL | Status: AC
  Administered 2022-10-23: 40 meq via ORAL
  Filled 2022-10-23: qty 2

## 2022-10-23 MED ORDER — METRONIDAZOLE 500 MG/100ML IV SOLN
INTRAVENOUS | Status: AC
  Administered 2022-10-23: 500 mg via INTRAVENOUS
  Filled 2022-10-23: qty 100

## 2022-10-23 NOTE — Progress Notes (Signed)
[facility]  PROGRESS NOTE  Richard Carroll ZOX:[id number] DOB: May 02, 1970 DOA: 10/22/2022 PCP: Kristian Covey, MD   Brief HPI:    52 y.o. male with medical history significant for T2DM, HLD who presents to the ED for evaluation of diarrhea and vomiting.  Initially seen in the ED 7/15 for blood in his stool that began on 7/4.  C. difficile and GI pathogen panels were negative.  He was given IV fluids and short course of azithromycin is for possible infectious diarrhea.  He was seen again 7/16.  CT showed changes consistent with infectious or inflammatory colitis.  Antibiotics were changed to ciprofloxacin and Flagyl. He has been having continued diarrhea. He has been seeing some red blood mixed in the stool.   Assessment/Plan:    Diabetic ketoacidosis -Resolved, likely in setting of diarrhea; patient is also on Jardiance -Trulicity, Jardiance, Amaryl and metformin are on hold -DKA resolved, anion gap closed -Bicarb still low but has high chloride; beta-hydroxybutyrate down to 0.31 -Will discontinue IV insulin, start sliding scale insulin NovoLog -Started Lantus 10 units subcu daily  Bloody diarrhea -CT abdomen/pelvis shows diffuse colitis -Ischemic colitis versus inflammatory bowel disease -Will consult gastroenterology -Stool for C. difficile PCR and GI pathogen panel obtained on 7/15 are negative  Hyperlipidemia -Statin on hold   Medications     Chlorhexidine Gluconate Cloth  6 each Topical Q0600   insulin aspart  0-9 Units Subcutaneous TID WC   insulin glargine-yfgn  10 Units Subcutaneous Daily   mesalamine  2.4 g Oral BID WC     Data Reviewed:   CBG:  Recent Labs  Lab 10/23/22 1126 10/23/22 1229 10/23/22 1311 10/23/22 1406 10/23/22 1600  GLUCAP 154* 138* 121* 108* 169*    SpO2: 99 %    Vitals:   10/23/22 1200 10/23/22 1300 10/23/22 1400 10/23/22 1500  BP: 133/76 126/70 (!) 144/69 125/75  Pulse: 87 93 85 (!) 125  Resp: 20 20 16  (!) 22   Temp:      TempSrc:      SpO2: 97% 99% 97% 99%  Weight:      Height:          Data Reviewed:  Basic Metabolic Panel: Recent Labs  Lab 10/22/22 1730 10/22/22 2118 10/23/22 0221 10/23/22 0619 10/23/22 0927  NA 139 141 140 139 137  K 3.8 3.2* 3.1* 3.0* 3.4*  CL 112* 115* 115* 116* 116*  CO2 11* 15* 18* 18* 16*  GLUCOSE 345* 206* 157* 150* 141*  BUN 12 12 11 11 11   CREATININE 1.05 0.91 0.75 0.50* 0.71  CALCIUM 8.5* 8.0* 7.8* 7.7* 7.6*  MG  --   --   --  1.8  --     CBC: Recent Labs  Lab 10/18/22 0938 10/19/22 1702 10/22/22 1730 10/23/22 0221  WBC 8.9 12.2* 14.8* 13.1*  NEUTROABS 5.8 8.3* 10.4*  --   HGB 14.2 14.0 16.7 14.1  HCT 41.6 41.7 51.1 42.9  MCV 90.0 91.9 92.9 92.3  PLT 280 322 443* 419*    LFT Recent Labs  Lab 10/18/22 0938 10/19/22 1702 10/22/22 1736  AST 18 11* 9*  ALT 21 14 14   ALKPHOS 84 74 69  BILITOT 0.5 0.8 1.2  PROT 6.1* 6.0* 6.5  ALBUMIN 2.9* 2.7* 2.8*     Antibiotics: Anti-infectives (From admission, onward)    Start     Dose/Rate Route Frequency Ordered Stop   10/23/22 0900  metroNIDAZOLE (FLAGYL) IVPB 500 mg  Status:  Discontinued  500 mg 100 mL/hr over 60 Minutes Intravenous Every 12 hours 10/22/22 2213 10/23/22 0846   10/23/22 0900  metroNIDAZOLE (FLAGYL) IVPB 500 mg  Status:  Discontinued        500 mg 100 mL/hr over 60 Minutes Intravenous 2 times daily 10/23/22 0846 10/23/22 1501   10/22/22 2300  cefTRIAXone (ROCEPHIN) 2 g in sodium chloride 0.9 % 100 mL IVPB  Status:  Discontinued        2 g 200 mL/hr over 30 Minutes Intravenous Every 24 hours 10/22/22 2213 10/23/22 1501   10/22/22 1915  azithromycin (ZITHROMAX) 500 mg in sodium chloride 0.9 % 250 mL IVPB  Status:  Discontinued        500 mg 250 mL/hr over 60 Minutes Intravenous  Once 10/22/22 1906 10/22/22 2213   10/22/22 1915  ciprofloxacin (CIPRO) IVPB 400 mg        400 mg 200 mL/hr over 60 Minutes Intravenous  Once 10/22/22 1906 10/22/22 2025   10/22/22  1915  metroNIDAZOLE (FLAGYL) IVPB 500 mg        500 mg 100 mL/hr over 60 Minutes Intravenous  Once 10/22/22 1906 10/22/22 2149        DVT prophylaxis: Lantus  Code Status: Full code  Family Communication: Discussed with wife at bedside   CONSULTS gastroenterology   Subjective   Still had 3 bloody bowel movements this morning.   Objective    Physical Examination:   General-appears in no acute distress Heart-S1-S2, regular, no murmur auscultated Lungs-clear to auscultation bilaterally, no wheezing or crackles auscultated Abdomen-soft, nontender, no organomegaly Extremities-no edema in the lower extremities Neuro-alert, oriented x3, no focal deficit noted  Status is: Inpatient:             Meredeth Ide   [facility] If 7PM-7AM, please contact night-coverage at [email or web address], Office  [phone]   10/23/2022, 4:16 PM  LOS: 1 day

## 2022-10-23 NOTE — Consult Note (Addendum)
Consultation  Referring Provider:     [facility] Primary Care Physician:  Kristian Covey, MD Primary Gastroenterologist:      Barron Alvine   Reason for Consultation:     Colitis     Impression / Plan:   Bloody diarrhea and CT changes of colitis - sxs x 3 weeks - C diff and GI PCR panel negative - 2022 colonoscopy showed inflammation ? Crohn's disease - observation recommended  So this looks like he has symptomatic Crohn's at this time   Nausea and vomiting - medication side effects (Abx) - improved  DM Type 2 w/ ketosis, dehydration - improving  FHx CRCA and personal hx colon polyps  --------------------------------------------------------------------------------------------------------  Start mesalamine 2.4 g bid for Crohn's colitis - working dx  Consider colonoscopy - though given 2022 findings may not be necessary  Try to avoid steroids given DM but depends upon clinical course  ESR, CRP, fecal calprotectin  The risks and benefits as well as alternatives of endoscopic procedure(s) have been discussed and reviewed. All questions answered. The patient agrees to proceed.  I have stopped Abx  Will f/u tomorrow  Iva Boop, MD, [facility] See Loretha Stapler on call - gastroenterology for best contact person 10/23/2022 2:52 PM         HPI:   Richard Carroll is a 52 y.o. male w/ T2DM who developed diarrhea early July during after trip to [facility]. No sick contacts, no seafood, no clear cause. Bloody diarrhea persisted and he has been evaluated at hospital and by PCP  Saw PCP 7/10  7/15 neg GI profile and C diff - ED visit 7/16 ED visit again - CT - Cipro and Flagyl empiric IMPRESSION: 1. Diffuse colonic wall thickening with mild pericolonic stranding extending from the transverse colon through the rectosigmoid colon most likely representing infectious or inflammatory colitis. 2. Small amount of free fluid in the pelvis is likely reactive. 3.  Aortic atherosclerosis. 7/19 PCP nausea and vomiting since Abx 17# wgt loss - back to ED and admitted  CT again 7/19 IMPRESSION: 1. Diffuse colonic wall thickening/edema involving the transverse, descending, and rectosigmoid colon, similar to prior study. Changes likely to represent infectious or inflammatory colitis. 2. No evidence of bowel obstruction. 3. Fatty infiltration of the liver. 4. Mild aortic atherosclerosis.  He continues w/ bloody diarrhea, has ketosis - improving. Taking clears, no vomiting. Has not had much pain. No prior issues like this - 10/22 colonoscopy - screening -   IMPRESSION: 1. Diffuse colonic wall thickening/edema involving the transverse, descending, and rectosigmoid colon, similar to prior study. Changes likely to represent infectious or inflammatory colitis. 2. No evidence of bowel obstruction. 3. Fatty infiltration of the liver. 4. Mild aortic atherosclerosis.  1. Surgical [P], right colon - PATCHY MILDLY ACTIVE COLITIS. SEE NOTE - NEGATIVE FOR FEATURES OF CHRONICITY OR GRANULOMAS 2. Surgical [P], colon, ascending, polyp (2) - TUBULAR ADENOMA WITHOUT HIGH-GRADE DYSPLASIA OR MALIGNANCY - OTHER FRAGMENT OF POLYPOID COLONIC MUCOSA WITH NO SPECIFIC HISTOPATHOLOGIC CHANGES 3. Surgical [P], transverse colon biopsy's - PATCHY ACUTE/SUBACUTE COLITIS. SEE NOTE - NEGATIVE FOR GRANULOMAS 4. Surgical [P], colon, descending, sigmoid, polyp (3) - TUBULAR ADENOMA(S) WITHOUT HIGH-GRADE DYSPLASIA OR MALIGNANCY 5. Surgical [P], colon, sigmoid - PATCHY MILDLY ACTIVE COLITIS - NEGATIVE FOR CHILLS OF CHRONICITY OR GRANULOMAS 6. Surgical [P], colon, rectum - HYPERPLASTIC POLYP(S) Diagnosis Note 1. , 3 and 5. The biopsies show patchy cryptitis and scattered crypt abscesses. 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 Though definite features of chronicity are not identified, evolving chronicity cannot be ruled out. Histologic  features of inflammatory bowel disease are not present in the submitted biopsies. Differential diagnosis includes infection, drug-effect and less likely, evolving inflammatory bowel disease. Clinical and radiologic correlation is suggested.  Dr. Barron Alvine recommended GI f/u to discuss ?? IBD but not proven then He had NL ESR and CRP so observation was recommended  Past Medical History:  Diagnosis Date   Arthritis    Diabetes mellitus type II    DIABETES MELLITUS, TYPE II 08/18/2009   on meds   Hay fever    with allergies   Hyperlipidemia    HYPERLIPIDEMIA 08/18/2009   on meds   Seasonal allergies     Past Surgical History:  Procedure Laterality Date   COLONOSCOPY  01/2021   FINGER SURGERY Right    ring finger   KNEE SURGERY Right 2005    Family History  Problem Relation Age of Onset   Hyperlipidemia Mother    Stroke Mother    Hypertension Mother    Diabetes Mother    Prostate cancer Father 73       2004   Colon cancer Father 58       2013   Heart disease Father    Diabetes Maternal Grandmother    Alcohol abuse Maternal Grandfather    Colon cancer Paternal Uncle    Prostate cancer Paternal Uncle    Colon polyps Neg Hx    Esophageal cancer Neg Hx    Stomach cancer Neg Hx    Rectal cancer Neg Hx     Social History   Tobacco Use   Smoking status: Never   Smokeless tobacco: Current    Types: Chew  Vaping Use   Vaping status: Never Used  Substance Use Topics   Alcohol use: Not Currently   Drug use: No    Prior to Admission medications   Medication Sig Start Date End Date Taking? Authorizing Provider  ciprofloxacin (CIPRO) 500 MG tablet Take 1 tablet (500 mg total) by mouth every 12 (twelve) hours. 10/19/22  Yes Roemhildt, Lorin T, PA-C  Dulaglutide (TRULICITY) 0.75 MG/0.5ML SOPN Inject 0.75 mg into the skin once a week. 10/13/22  Yes Burchette, Elberta Fortis, MD  glimepiride (AMARYL) 4 MG tablet TAKE 1 TABLET BY MOUTH EVERY DAY BEFORE BREAKFAST 07/12/22  Yes  Burchette, Elberta Fortis, MD  JARDIANCE 25 MG TABS tablet TAKE 1 TABLET BY MOUTH EVERY DAY BEFORE BREAKFAST 08/06/22  Yes Burchette, Elberta Fortis, MD  metFORMIN (GLUCOPHAGE) 500 MG tablet TAKE 2 TABLETS (1,000 MG TOTAL) BY MOUTH 2 (TWO) TIMES DAILY WITH A MEAL. 09/24/22  Yes Burchette, Elberta Fortis, MD  metroNIDAZOLE (FLAGYL) 500 MG tablet Take 1 tablet (500 mg total) by mouth 2 (two) times daily. 10/19/22  Yes Roemhildt, Lorin T, PA-C  Multiple Vitamin (ONE-A-DAY MENS PO) Take 1 tablet by mouth daily.   Yes [provider]  sildenafil (VIAGRA) 100 MG tablet Take 0.5-1 tablets (50-100 mg total) by mouth daily as needed for erectile dysfunction. 01/19/19  Yes Burchette, Elberta Fortis, MD  simvastatin (ZOCOR) 80 MG tablet TAKE 1/2 TABLET BY MOUTH EVERY DAY 05/20/22  Yes Burchette, Elberta Fortis, MD    Current Facility-Administered Medications  Medication Dose Route Frequency Provider Last Rate Last Admin   acetaminophen (TYLENOL) tablet 650 mg  650 mg Oral Q6H PRN Darreld Mclean R, MD       cefTRIAXone (ROCEPHIN) 2 g in sodium chloride  0.9 % 100 mL IVPB  2 g Intravenous Q24H Charlsie Quest, MD   Stopped at 10/23/22 0207   Chlorhexidine Gluconate Cloth 2 % PADS 6 each  6 each Topical Q0600 Charlsie Quest, MD   6 each at 10/23/22 0509   dextrose 50 % solution 0-50 mL  0-50 mL Intravenous PRN Horton, Kristie M, DO       insulin aspart (novoLOG) injection 0-9 Units  0-9 Units Subcutaneous TID WC Sharl Ma, Sarina Ill, MD       insulin glargine-yfgn (SEMGLEE) injection 10 Units  10 Units Subcutaneous Daily Meredeth Ide, MD   10 Units at 10/23/22 1144   insulin regular, human (MYXREDLIN) 100 units/ 100 mL infusion   Intravenous Continuous Horton, Kristie M, DO 1 mL/hr at 10/23/22 1313 1 Units/hr at 10/23/22 1313   lactated ringers infusion   Intravenous Continuous Meredeth Ide, MD 75 mL/hr at 10/23/22 1313 Infusion Verify at 10/23/22 1313   metroNIDAZOLE (FLAGYL) IVPB 500 mg  500 mg Intravenous BID Meredeth Ide, MD   Stopped at  10/23/22 1050   morphine (PF) 2 MG/ML injection 1 mg  1 mg Intravenous Q3H PRN Charlsie Quest, MD       ondansetron (ZOFRAN) injection 4 mg  4 mg Intravenous Q6H PRN Charlsie Quest, MD   4 mg at 10/23/22 0121   Oral care mouth rinse  15 mL Mouth Rinse PRN Meredeth Ide, MD        Allergies as of 10/22/2022   (No Known Allergies)     Review of Systems:    This is positive for those things mentioned in the HPI, also positive for weakness. All other review of systems are negative.       Physical Exam:  Vital signs in last 24 hours: Temp:  [97.5 F (36.4 C)-98 F (36.7 C)] 98 F (36.7 C) (07/20 0823) Pulse Rate:  [87-127] 93 (07/20 1300) Resp:  [14-23] 20 (07/20 1300) BP: (112-149)/(60-98) 126/70 (07/20 1300) SpO2:  [95 %-100 %] 99 % (07/20 1300) Weight:  [81.8 kg-83.2 kg] 83.2 kg (07/19 2240) Last BM Date : 10/23/22  General:  Well-developed, well-nourished and in no acute distress Eyes:  anicteric. ENT:   Mouth and posterior pharynx free of lesions.  Lungs: Clear to auscultation bilaterally. Heart:  S1S2, no rubs, murmurs, gallops. Abdomen:  soft, non-tender, no hepatosplenomegaly, hernia, or mass and BS+.  Lymph:  no cervical or supraclavicular adenopathy. Extremities:   no edema Skin   no rash. Neuro:  A&O x 3.  Psych:  appropriate mood and  Affect.   Data Reviewed:   LAB RESULTS: Recent Labs    10/22/22 1730 10/23/22 0221  WBC 14.8* 13.1*  HGB 16.7 14.1  HCT 51.1 42.9  PLT 443* 419*   BMET Recent Labs    10/23/22 0221 10/23/22 0619 10/23/22 0927  NA 140 139 137  K 3.1* 3.0* 3.4*  CL 115* 116* 116*  CO2 18* 18* 16*  GLUCOSE 157* 150* 141*  BUN 11 11 11   CREATININE 0.75 0.50* 0.71  CALCIUM 7.8* 7.7* 7.6*   LFT Recent Labs    10/22/22 1736  PROT 6.5  ALBUMIN 2.8*  AST 9*  ALT 14  ALKPHOS 69  BILITOT 1.2  BILIDIR <0.1  IBILI NOT CALCULATED    STUDIES: CT ABDOMEN PELVIS W CONTRAST  Result Date: 10/22/2022 CLINICAL DATA:  Acute  nonlocalized abdominal pain. Diarrhea with rectal bleeding for 3 weeks. Nausea, fatigue, weakness,  and abdominal pain. Recent weight loss. EXAM: CT ABDOMEN AND PELVIS WITH CONTRAST TECHNIQUE: Multidetector CT imaging of the abdomen and pelvis was performed using the standard protocol following bolus administration of intravenous contrast. RADIATION DOSE REDUCTION: This exam was performed according to the departmental dose-optimization program which includes automated exposure control, adjustment of the mA and/or kV according to patient size and/or use of iterative reconstruction technique. CONTRAST:  OMNIPAQUE IOHEXOL 300 MG/ML  SOLN COMPARISON:  10/19/2022 and 12/15/2015 FINDINGS: Lower chest: Lung bases are clear. Hepatobiliary: Mild diffuse fatty infiltration of the liver. No focal lesions. Gallbladder and bile ducts are unremarkable. Pancreas: Unremarkable. No pancreatic ductal dilatation or surrounding inflammatory changes. Spleen: Normal in size without focal abnormality. Adrenals/Urinary Tract: No adrenal gland nodules. Kidneys are symmetrical. Nephrograms are homogeneous. No hydronephrosis or hydroureter. Bladder is normal. Stomach/Bowel: Stomach and small bowel are mostly decompressed. As seen previously, there is diffuse wall thickening throughout the colon beginning at the transverse colon and extending through the descending colon, sigmoid colon, and rectum. Changes are most likely represent infectious or inflammatory colitis. Consider Crohn's disease or pseudomembranous colitis versus infectious colitis. Scattered stool in the colon. Appendix is normal. Vascular/Lymphatic: Mild aortic atherosclerosis. No enlarged abdominal or pelvic lymph nodes. Reproductive: Prostate is unremarkable. Other: Minimal residual fluid in the pelvis, resolving since prior study. No free air. Musculoskeletal: Degenerative changes in the spine. No acute bony abnormalities. IMPRESSION: 1. Diffuse colonic wall  thickening/edema involving the transverse, descending, and rectosigmoid colon, similar to prior study. Changes likely to represent infectious or inflammatory colitis. 2. No evidence of bowel obstruction. 3. Fatty infiltration of the liver. 4. Mild aortic atherosclerosis. Electronically Signed   By: Burman Nieves M.D.   On: 10/22/2022 20:15        Thanks   LOS: 1 day   @Richard Carroll  Sena Slate, MD, [facility] @  10/23/2022, 2:52 PM

## 2022-10-23 NOTE — Inpatient Diabetes Management (Signed)
Inpatient Diabetes Program Recommendations  AACE/ADA: New Consensus Statement on Inpatient Glycemic Control  Target Ranges:  Prepandial:   less than 140 mg/dL      Peak postprandial:   less than 180 mg/dL (1-2 hours)      Critically ill patients:  140 - 180 mg/dL    Latest Reference Range & Units 10/22/22 19:31 10/22/22 20:32 10/22/22 21:32 10/22/22 22:33 10/22/22 23:32  Glucose-Capillary 70 - 99 mg/dL 161 (H) 096 (H) 045 (H) 166 (H) 200 (H)    Latest Reference Range & Units 10/22/22 17:30 10/23/22 06:19  CO2 22 - 32 mmol/L 11 (L) 18  Glucose 70 - 99 mg/dL 409 (H) 811  Anion gap 5 - 15  16 (H) 5    Latest Reference Range & Units 04/20/22 09:31 10/23/22 02:21  Hemoglobin A1C 4.8 - 5.6 % 7.1 ! 12.1 (H)    Review of Glycemic Control  Diabetes history: DM2 Outpatient Diabetes medications: Amaryl 4 mg QAM, Jardiance 25 mg QAM, Metformin 1000 mg BID, Trulicity 0.75 mg Qweek Current orders for Inpatient glycemic control: IV insulin  Inpatient Diabetes Program Recommendations:    Insulin: Once acidosis is completely cleared and provider is ready to transition from IV to SQ insulin, please consider ordering Semglee 12 units Q24H (based on 83.2 kg x 0.15 units), CBGs Q4H, Novolog 0-15 units Q4H.  NOTE: Noted consult for Diabetes Coordinator. Diabetes Coordinator is not on campus over the weekend but available by pager from 8am to 5pm for questions or concerns. Chart reviewed. Patient admitted with DKA and colitis. Initial lab glucose 345 mg/dl on 12/17/76 and patient started on IV insulin. Noted patient seen PCP on 10/13/22 with blood in stools; per office note patient was taking Amaryl, Jardiance, and Metformin for DM but had not taken Trulicity in 4 months due to not being available at pharmacy; was sent in new Rx for Trulicity at 0.75 mg Qweek. Patient was seen in [facility] on 10/18/22 for bloody stool and dx with colitis. Patient seen PCP again on 10/22/22 and was advised to go back to ED for likely  admission.   Thanks, Orlando Penner, RN, MSN, CDCES Diabetes Coordinator Inpatient Diabetes Program [phone] (Team Pager from 8am to 5pm)

## 2022-10-23 NOTE — Progress Notes (Signed)
Transition of Care [facility]) - Inpatient Brief Assessment   Patient Details  Name: Richard Carroll MRN: [id number] Date of Birth: [birth year]  [facility]) CM/SW Contact:    Adrian Prows, RN Phone Number: 10/23/2022, 1:47 PM   Clinical Narrative: Pt idenified POC wife Richard Carroll [phone]; Brief Assessment completed.   Transition of Care Asessment: Insurance and Status: Insurance coverage has been reviewed Patient has primary care physician: Yes Home environment has been reviewed: yes Prior level of function:: independent Prior/Current Home Services: No current home services Social Determinants of Health Reivew: SDOH reviewed no interventions necessary Readmission risk has been reviewed: Yes Transition of care needs: no transition of care needs at this time

## 2022-10-24 ENCOUNTER — Encounter (HOSPITAL_COMMUNITY): Payer: Self-pay | Admitting: Internal Medicine

## 2022-10-24 DIAGNOSIS — R112 Nausea with vomiting, unspecified: Secondary | ICD-10-CM | POA: Diagnosis not present

## 2022-10-24 DIAGNOSIS — E1110 Type 2 diabetes mellitus with ketoacidosis without coma: Secondary | ICD-10-CM | POA: Diagnosis not present

## 2022-10-24 DIAGNOSIS — K529 Noninfective gastroenteritis and colitis, unspecified: Secondary | ICD-10-CM | POA: Diagnosis not present

## 2022-10-24 DIAGNOSIS — E119 Type 2 diabetes mellitus without complications: Secondary | ICD-10-CM | POA: Diagnosis not present

## 2022-10-24 DIAGNOSIS — K921 Melena: Secondary | ICD-10-CM | POA: Diagnosis not present

## 2022-10-24 DIAGNOSIS — K50111 Crohn's disease of large intestine with rectal bleeding: Secondary | ICD-10-CM | POA: Diagnosis not present

## 2022-10-24 LAB — GLUCOSE, CAPILLARY
Glucose-Capillary: 180 mg/dL — ABNORMAL HIGH (ref 70–99)
Glucose-Capillary: 221 mg/dL — ABNORMAL HIGH (ref 70–99)
Glucose-Capillary: 170 mg/dL — ABNORMAL HIGH (ref 70–99)
Glucose-Capillary: 197 mg/dL — ABNORMAL HIGH (ref 70–99)

## 2022-10-24 LAB — BASIC METABOLIC PANEL
Anion gap: 10 (ref 5–15)
BUN: 10 mg/dL (ref 6–20)
CO2: 18 mmol/L — ABNORMAL LOW (ref 22–32)
Calcium: 7.6 mg/dL — ABNORMAL LOW (ref 8.9–10.3)
Chloride: 109 mmol/L (ref 98–111)
Creatinine, Ser: 0.7 mg/dL (ref 0.61–1.24)
GFR, Estimated: >60 mL/min (ref >60)
Glucose, Bld: 182 mg/dL — ABNORMAL HIGH (ref 70–99)
Potassium: 3.3 mmol/L — ABNORMAL LOW (ref 3.5–5.1)
Sodium: 137 mmol/L (ref 135–145)

## 2022-10-24 LAB — SEDIMENTATION RATE: Sed Rate: 12 mm/hr (ref 0–16)

## 2022-10-24 LAB — MAGNESIUM: Magnesium: 1.8 mg/dL (ref 1.7–2.4)

## 2022-10-24 LAB — C-REACTIVE PROTEIN: CRP: 6.9 mg/dL — ABNORMAL HIGH (ref <1.0)

## 2022-10-24 MED ORDER — INSULIN GLARGINE-YFGN 100 UNIT/ML ~~LOC~~ SOLN
10.0000 [IU] | Freq: Two times a day (BID) | SUBCUTANEOUS | Status: DC
  Administered 2022-10-24 – 2022-10-27 (×6): 10 [IU] via SUBCUTANEOUS
  Filled 2022-10-24 (×7): qty 0.1

## 2022-10-24 MED ORDER — INSULIN ASPART 100 UNIT/ML IJ SOLN
0.0000 [IU] | Freq: Every day | INTRAMUSCULAR | Status: DC
  Administered 2022-10-25: 3 [IU] via SUBCUTANEOUS
  Administered 2022-10-26: 2 [IU] via SUBCUTANEOUS

## 2022-10-24 MED ORDER — INSULIN ASPART 100 UNIT/ML IJ SOLN
0.0000 [IU] | Freq: Three times a day (TID) | INTRAMUSCULAR | Status: DC
  Administered 2022-10-24: 4 [IU] via SUBCUTANEOUS
  Administered 2022-10-24: 7 [IU] via SUBCUTANEOUS
  Administered 2022-10-25 (×2): 3 [IU] via SUBCUTANEOUS
  Administered 2022-10-26: 11 [IU] via SUBCUTANEOUS
  Administered 2022-10-26: 7 [IU] via SUBCUTANEOUS
  Administered 2022-10-27: 11 [IU] via SUBCUTANEOUS

## 2022-10-24 MED ORDER — PEG-KCL-NACL-NASULF-NA ASC-C 100 G PO SOLR
0.5000 | Freq: Once | ORAL | Status: AC
  Administered 2022-10-24: 100 g via ORAL
  Filled 2022-10-24: qty 1

## 2022-10-24 MED ORDER — INSULIN ASPART 100 UNIT/ML IJ SOLN: 0.0000 [IU] | Freq: Three times a day (TID) | INTRAMUSCULAR | Status: DC

## 2022-10-24 MED ORDER — METOCLOPRAMIDE HCL 5 MG/ML IJ SOLN
10.0000 mg | Freq: Once | INTRAMUSCULAR | Status: AC
  Administered 2022-10-24: 10 mg via INTRAVENOUS
  Filled 2022-10-24: qty 2

## 2022-10-24 MED ORDER — METHYLPREDNISOLONE SODIUM SUCC 40 MG IJ SOLR
40.0000 mg | Freq: Every day | INTRAMUSCULAR | Status: DC
  Administered 2022-10-24 – 2022-10-27 (×4): 40 mg via INTRAVENOUS
  Filled 2022-10-24 (×4): qty 1

## 2022-10-24 MED ORDER — POTASSIUM CHLORIDE CRYS ER 20 MEQ PO TBCR
40.0000 meq | EXTENDED_RELEASE_TABLET | Freq: Once | ORAL | Status: AC
  Administered 2022-10-24: 40 meq via ORAL
  Filled 2022-10-24: qty 2

## 2022-10-24 NOTE — Plan of Care (Signed)

## 2022-10-24 NOTE — H&P (View-Only) (Signed)
   Patient Name: Richard Carroll Date of Encounter: 10/24/2022, 10:52 AM     Assessment and Plan  Bloody diarrhea and CT changes of colitis - sxs x 3 weeks - C diff and GI PCR panel negative - 2022 colonoscopy showed inflammation ? Crohn's disease - observation recommended   So this looks like he has symptomatic Crohn's at this time    Nausea and vomiting - medication side effects (Abx) - improved   DM Type 2 w/ ketosis, dehydration - improving   FHx CRCA and personal hx colon polyps  --------------------------------------------------------------------------------------------------------- Add solumedrol  Colonoscopy tomorrow to get endoscopic assessment The risks and benefits as well as alternatives of endoscopic procedure(s) have been discussed and reviewed. All questions answered. The patient agrees to proceed.  Continue mesalamine  Await calprotectin (ordered to serve as a reference to monitor tx in future)  Quantiferon TB amd Hep B studes tomorrow in anticipation of biologic vs small molecule therapy - if available through insurance I think Rinvoq would be a good idea given rapid response often seen and need to get off steroids as soon as possible w/ DM  SCD's - IBD flare higher risk DVT/PE - consider Lovenox later - not necessarily contraindicated here  I have discussed w/ Dr. Sharl Ma of hospitalists - complex patient with high level of medical decision making   Subjective  Still w/ runny bloody stools - numerous    Objective  BP 134/81   Pulse 93   Temp 98 F (36.7 C) (Oral)   Resp (!) 21   Ht 5\' 10"  (1.778 m)   Wt 83.2 kg   SpO2 97%   BMI 26.32 kg/m  Abd is soft and sl tender epigastric area, BS + Lungs cta ant NL Cor  Recent Labs  Lab 10/19/22 1702 10/22/22 1730 10/23/22 0221  HGB 14.0 16.7 14.1  HCT 41.7 51.1 42.9  WBC 12.2* 14.8* 13.1*  PLT 322 443* 419*   Recent Labs  Lab 10/23/22 0221 10/23/22 0619 10/23/22 0927 10/23/22 1646  10/24/22 0320  NA 140 139 137 139 137  K 3.1* 3.0* 3.4* 3.2* 3.3*  CL 115* 116* 116* 113* 109  CO2 18* 18* 16* 19* 18*  GLUCOSE 157* 150* 141* 177* 182*  BUN 11 11 11 10 10   CREATININE 0.75 0.50* 0.71 0.68 0.70  CALCIUM 7.8* 7.7* 7.6* 7.7* 7.6*  MG  --  1.8  --   --  1.8   Recent Labs  Lab 10/18/22 0938 10/19/22 1702 10/22/22 1736  AST 18 11* 9*  ALT 21 14 14   ALKPHOS 84 74 69  BILITOT 0.5 0.8 1.2  PROT 6.1* 6.0* 6.5  ALBUMIN 2.9* 2.7* 2.8*   Lab Results  Component Value Date   CRP 6.9 (H) 10/24/2022   Lab Results  Component Value Date   ESRSEDRATE 12 10/24/2022         Iva Boop, MD, [facility] See AMION on call - gastroenterology for best contact person 10/24/2022 10:52 AM

## 2022-10-24 NOTE — Progress Notes (Signed)
[facility]  PROGRESS NOTE  Richard Carroll:[id number] DOB: 1970-12-14 DOA: 10/22/2022 PCP: Kristian Covey, MD   Brief HPI:    52 y.o. male with medical history significant for T2DM, HLD who presents to the ED for evaluation of diarrhea and vomiting.  Initially seen in the ED 7/15 for blood in his stool that began on 7/4.  C. difficile and GI pathogen panels were negative.  He was given IV fluids and short course of azithromycin is for possible infectious diarrhea.  He was seen again 7/16.  CT showed changes consistent with infectious or inflammatory colitis.  Antibiotics were changed to ciprofloxacin and Flagyl. He has been having continued diarrhea. He has been seeing some red blood mixed in the stool.   Assessment/Plan:    Diabetic ketoacidosis -Resolved, likely in setting of diarrhea; patient is also on Jardiance -Trulicity, Jardiance, Amaryl and metformin are on hold -DKA resolved, anion gap closed -Bicarb still low but has high chloride; beta-hydroxybutyrate down to 0.31 -IV insulin was discontinued and patient started on Lantus 10 units subcu daily along with sliding scale insulin with NovoLog -He has been started on Solu-Medrol, will change sliding scale to resistant, increased Lantus to 10 units subcu twice daily -Continue to monitor blood glucose closely  Bloody diarrhea -CT abdomen/pelvis shows diffuse colitis -Ischemic colitis versus inflammatory bowel disease -[facility]ology  consulted, plan for colonoscopy in a.m. -Stool for C. difficile PCR and GI pathogen panel obtained on 7/15 are negative  Hypokalemia -Potassium is 3.3 -Replace potassium and follow BMP in am  Hyperlipidemia -Statin on hold   Medications     Chlorhexidine Gluconate Cloth  6 each Topical Daily   insulin aspart  0-9 Units Subcutaneous TID WC   insulin glargine-yfgn  10 Units Subcutaneous Daily   mesalamine  2.4 g Oral BID WC     Data Reviewed:   CBG:  Recent Labs   Lab 10/23/22 1229 10/23/22 1311 10/23/22 1406 10/23/22 1600 10/23/22 2123  GLUCAP 138* 121* 108* 169* 162*    SpO2: 97 %    Vitals:   10/24/22 0300 10/24/22 0400 10/24/22 0500 10/24/22 0600  BP: (!) 145/97 135/67 (!) 146/64 129/74  Pulse: (!) 113 85 90 90  Resp: 14 15 17 14   Temp:  98.6 F (37 C)    TempSrc:  Oral    SpO2: 99% 96% 95% 97%  Weight:      Height:          Data Reviewed:  Basic Metabolic Panel: Recent Labs  Lab 10/23/22 0221 10/23/22 0619 10/23/22 0927 10/23/22 1646 10/24/22 0320  NA 140 139 137 139 137  K 3.1* 3.0* 3.4* 3.2* 3.3*  CL 115* 116* 116* 113* 109  CO2 18* 18* 16* 19* 18*  GLUCOSE 157* 150* 141* 177* 182*  BUN 11 11 11 10 10   CREATININE 0.75 0.50* 0.71 0.68 0.70  CALCIUM 7.8* 7.7* 7.6* 7.7* 7.6*  MG  --  1.8  --   --  1.8    CBC: Recent Labs  Lab 10/18/22 0938 10/19/22 1702 10/22/22 1730 10/23/22 0221  WBC 8.9 12.2* 14.8* 13.1*  NEUTROABS 5.8 8.3* 10.4*  --   HGB 14.2 14.0 16.7 14.1  HCT 41.6 41.7 51.1 42.9  MCV 90.0 91.9 92.9 92.3  PLT 280 322 443* 419*    LFT Recent Labs  Lab 10/18/22 0938 10/19/22 1702 10/22/22 1736  AST 18 11* 9*  ALT 21 14 14   ALKPHOS 84 74 69  BILITOT  0.5 0.8 1.2  PROT 6.1* 6.0* 6.5  ALBUMIN 2.9* 2.7* 2.8*     Antibiotics: Anti-infectives (From admission, onward)    Start     Dose/Rate Route Frequency Ordered Stop   10/23/22 0900  metroNIDAZOLE (FLAGYL) IVPB 500 mg  Status:  Discontinued        500 mg 100 mL/hr over 60 Minutes Intravenous Every 12 hours 10/22/22 2213 10/23/22 0846   10/23/22 0900  metroNIDAZOLE (FLAGYL) IVPB 500 mg  Status:  Discontinued        500 mg 100 mL/hr over 60 Minutes Intravenous 2 times daily 10/23/22 0846 10/23/22 1501   10/22/22 2300  cefTRIAXone (ROCEPHIN) 2 g in sodium chloride 0.9 % 100 mL IVPB  Status:  Discontinued        2 g 200 mL/hr over 30 Minutes Intravenous Every 24 hours 10/22/22 2213 10/23/22 1501   10/22/22 1915  azithromycin (ZITHROMAX)  500 mg in sodium chloride 0.9 % 250 mL IVPB  Status:  Discontinued        500 mg 250 mL/hr over 60 Minutes Intravenous  Once 10/22/22 1906 10/22/22 2213   10/22/22 1915  ciprofloxacin (CIPRO) IVPB 400 mg        400 mg 200 mL/hr over 60 Minutes Intravenous  Once 10/22/22 1906 10/22/22 2025   10/22/22 1915  metroNIDAZOLE (FLAGYL) IVPB 500 mg        500 mg 100 mL/hr over 60 Minutes Intravenous  Once 10/22/22 1906 10/22/22 2149        DVT prophylaxis: Lantus  Code Status: Full code  Family Communication: Discussed with wife at bedside   CONSULTS gastroenterology   Subjective   Continues to have bloody diarrhea   Objective    Physical Examination:   Appears in no acute distress S1-S2, regular, no murmur auscultated Lungs clear to auscultation bilaterally Abdomen is soft, mild generalized tenderness to palpation  Status is: Inpatient:             Meredeth Ide   [facility] If 7PM-7AM, please contact night-coverage at [email or web address], Office  [phone]   10/24/2022, 7:49 AM  LOS: 2 days

## 2022-10-24 NOTE — Anesthesia Preprocedure Evaluation (Signed)
Anesthesia Evaluation  Patient identified by MRN, date of birth, ID band Patient awake    Reviewed: Allergy & Precautions, NPO status , Patient's Chart, lab work & pertinent test results  Airway Mallampati: II  TM Distance: >3 FB Neck ROM: Full    Dental  (+) Teeth Intact, Dental Advisory Given   Pulmonary neg pulmonary ROS   Pulmonary exam normal breath sounds clear to auscultation       Cardiovascular negative cardio ROS Normal cardiovascular exam Rhythm:Regular Rate:Normal     Neuro/Psych negative neurological ROS  negative psych ROS   GI/Hepatic Neg liver ROS,,,Crohns colitis- Bloody diarrhea and CT changes of colitis x 3wks   Endo/Other  diabetes, Well Controlled, Type 2  FS 110@ 1200  Renal/GU negative Renal ROS  negative genitourinary   Musculoskeletal  (+) Arthritis , Osteoarthritis,    Abdominal   Peds  Hematology negative hematology ROS (+) Hb 14.1, plt 419   Anesthesia Other Findings Trulicity LD: march   Reproductive/Obstetrics negative OB ROS                             Anesthesia Physical Anesthesia Plan  ASA: 2  Anesthesia Plan: MAC   Post-op Pain Management:    Induction:   PONV Risk Score and Plan: 2 and Propofol infusion and TIVA  Airway Management Planned: Natural Airway and Simple Face Mask  Additional Equipment: None  Intra-op Plan:   Post-operative Plan:   Informed Consent: I have reviewed the patients History and Physical, chart, labs and discussed the procedure including the risks, benefits and alternatives for the proposed anesthesia with the patient or authorized representative who has indicated his/her understanding and acceptance.       Plan Discussed with: CRNA  Anesthesia Plan Comments:        Anesthesia Quick Evaluation

## 2022-10-24 NOTE — Progress Notes (Addendum)
   Patient Name: Richard Carroll Date of Encounter: 10/24/2022, 10:52 AM     Assessment and Plan  Bloody diarrhea and CT changes of colitis - sxs x 3 weeks - C diff and GI PCR panel negative - 2022 colonoscopy showed inflammation ? Crohn's disease - observation recommended   So this looks like he has symptomatic Crohn's at this time    Nausea and vomiting - medication side effects (Abx) - improved   DM Type 2 w/ ketosis, dehydration - improving   FHx CRCA and personal hx colon polyps  --------------------------------------------------------------------------------------------------------- Add solumedrol  Colonoscopy tomorrow to get endoscopic assessment The risks and benefits as well as alternatives of endoscopic procedure(s) have been discussed and reviewed. All questions answered. The patient agrees to proceed.  Continue mesalamine  Await calprotectin (ordered to serve as a reference to monitor tx in future)  Quantiferon TB amd Hep B studes tomorrow in anticipation of biologic vs small molecule therapy - if available through insurance I think Rinvoq would be a good idea given rapid response often seen and need to get off steroids as soon as possible w/ DM  SCD's - IBD flare higher risk DVT/PE - consider Lovenox later - not necessarily contraindicated here  I have discussed w/ Dr. Sharl Ma of hospitalists - complex patient with high level of medical decision making   Subjective  Still w/ runny bloody stools - numerous    Objective  BP 134/81   Pulse 93   Temp 98 F (36.7 C) (Oral)   Resp (!) 21   Ht 5\' 10"  (1.778 m)   Wt 83.2 kg   SpO2 97%   BMI 26.32 kg/m  Abd is soft and sl tender epigastric area, BS + Lungs cta ant NL Cor  Recent Labs  Lab 10/19/22 1702 10/22/22 1730 10/23/22 0221  HGB 14.0 16.7 14.1  HCT 41.7 51.1 42.9  WBC 12.2* 14.8* 13.1*  PLT 322 443* 419*   Recent Labs  Lab 10/23/22 0221 10/23/22 0619 10/23/22 0927 10/23/22 1646  10/24/22 0320  NA 140 139 137 139 137  K 3.1* 3.0* 3.4* 3.2* 3.3*  CL 115* 116* 116* 113* 109  CO2 18* 18* 16* 19* 18*  GLUCOSE 157* 150* 141* 177* 182*  BUN 11 11 11 10 10   CREATININE 0.75 0.50* 0.71 0.68 0.70  CALCIUM 7.8* 7.7* 7.6* 7.7* 7.6*  MG  --  1.8  --   --  1.8   Recent Labs  Lab 10/18/22 0938 10/19/22 1702 10/22/22 1736  AST 18 11* 9*  ALT 21 14 14   ALKPHOS 84 74 69  BILITOT 0.5 0.8 1.2  PROT 6.1* 6.0* 6.5  ALBUMIN 2.9* 2.7* 2.8*   Lab Results  Component Value Date   CRP 6.9 (H) 10/24/2022   Lab Results  Component Value Date   ESRSEDRATE 12 10/24/2022         Iva Boop, MD, [facility] See AMION on call - gastroenterology for best contact person 10/24/2022 10:52 AM

## 2022-10-25 ENCOUNTER — Encounter (HOSPITAL_COMMUNITY)
Admission: EM | Disposition: A | Discharge status: Home / Self Care | Payer: Self-pay | Source: Home / Self Care | Attending: Family Medicine

## 2022-10-25 ENCOUNTER — Inpatient Hospital Stay (HOSPITAL_COMMUNITY): Payer: BC Managed Care – PPO | Admitting: Anesthesiology

## 2022-10-25 ENCOUNTER — Encounter (HOSPITAL_COMMUNITY): Payer: Self-pay | Admitting: Internal Medicine

## 2022-10-25 DIAGNOSIS — E785 Hyperlipidemia, unspecified: Secondary | ICD-10-CM | POA: Diagnosis not present

## 2022-10-25 DIAGNOSIS — E1169 Type 2 diabetes mellitus with other specified complication: Secondary | ICD-10-CM

## 2022-10-25 DIAGNOSIS — K921 Melena: Secondary | ICD-10-CM | POA: Diagnosis not present

## 2022-10-25 DIAGNOSIS — K5010 Crohn's disease of large intestine without complications: Secondary | ICD-10-CM

## 2022-10-25 DIAGNOSIS — E1110 Type 2 diabetes mellitus with ketoacidosis without coma: Secondary | ICD-10-CM | POA: Diagnosis not present

## 2022-10-25 DIAGNOSIS — K50111 Crohn's disease of large intestine with rectal bleeding: Secondary | ICD-10-CM | POA: Diagnosis not present

## 2022-10-25 DIAGNOSIS — R197 Diarrhea, unspecified: Secondary | ICD-10-CM

## 2022-10-25 HISTORY — PX: BIOPSY: SHX5522

## 2022-10-25 HISTORY — PX: COLONOSCOPY WITH PROPOFOL: SHX5780

## 2022-10-25 LAB — GLUCOSE, CAPILLARY
Glucose-Capillary: 115 mg/dL — ABNORMAL HIGH (ref 70–99)
Glucose-Capillary: 261 mg/dL — ABNORMAL HIGH (ref 70–99)

## 2022-10-25 LAB — CBC
HCT: 39.1 % (ref 39.0–52.0)
Hemoglobin: 13.2 g/dL (ref 13.0–17.0)
MCH: 30.6 pg (ref 26.0–34.0)
MCHC: 33.8 g/dL (ref 30.0–36.0)
MCV: 90.7 fL (ref 80.0–100.0)
Platelets: 360 10*3/uL (ref 150–400)
RBC: 4.31 MIL/uL (ref 4.22–5.81)
RDW: 13.2 % (ref 11.5–15.5)
WBC: 11.2 10*3/uL — ABNORMAL HIGH (ref 4.0–10.5)
nRBC: 0 % (ref 0.0–0.2)

## 2022-10-25 LAB — COMPREHENSIVE METABOLIC PANEL
ALT: 12 U/L (ref 0–44)
AST: 19 U/L (ref 15–41)
Albumin: 2.1 g/dL — ABNORMAL LOW (ref 3.5–5.0)
Alkaline Phosphatase: 52 U/L (ref 38–126)
Anion gap: 9 (ref 5–15)
BUN: 10 mg/dL (ref 6–20)
CO2: 21 mmol/L — ABNORMAL LOW (ref 22–32)
Calcium: 7.6 mg/dL — ABNORMAL LOW (ref 8.9–10.3)
Chloride: 106 mmol/L (ref 98–111)
Creatinine, Ser: 0.63 mg/dL (ref 0.61–1.24)
GFR, Estimated: >60 mL/min (ref >60)
Glucose, Bld: 174 mg/dL — ABNORMAL HIGH (ref 70–99)
Potassium: 3.2 mmol/L — ABNORMAL LOW (ref 3.5–5.1)
Sodium: 136 mmol/L (ref 135–145)
Total Bilirubin: 0.6 mg/dL (ref 0.3–1.2)
Total Protein: 5.3 g/dL — ABNORMAL LOW (ref 6.5–8.1)

## 2022-10-25 LAB — HEPATITIS B SURFACE ANTIBODY,QUALITATIVE: Hep B S Ab: NONREACTIVE

## 2022-10-25 LAB — HEPATITIS B CORE ANTIBODY, TOTAL: Hep B Core Total Ab: NONREACTIVE

## 2022-10-25 LAB — HEPATITIS B SURFACE ANTIGEN: Hepatitis B Surface Ag: NONREACTIVE

## 2022-10-25 SURGERY — COLONOSCOPY WITH PROPOFOL
Anesthesia: Monitor Anesthesia Care

## 2022-10-25 MED ORDER — POTASSIUM CHLORIDE CRYS ER 10 MEQ PO TBCR
40.0000 meq | EXTENDED_RELEASE_TABLET | Freq: Once | ORAL | Status: DC
  Filled 2022-10-25: qty 4

## 2022-10-25 MED ORDER — POTASSIUM CHLORIDE 10 MEQ/100ML IV SOLN
10.0000 meq | INTRAVENOUS | Status: DC
Start: 2022-10-25 — End: 2022-10-25

## 2022-10-25 MED ORDER — PROPOFOL 10 MG/ML IV BOLUS
INTRAVENOUS | Status: DC | PRN
  Administered 2022-10-25: 20 mg via INTRAVENOUS

## 2022-10-25 MED ORDER — PROPOFOL 1000 MG/100ML IV EMUL
INTRAVENOUS | Status: AC
  Filled 2022-10-25: qty 100

## 2022-10-25 MED ORDER — POTASSIUM CHLORIDE CRYS ER 20 MEQ PO TBCR
40.0000 meq | EXTENDED_RELEASE_TABLET | Freq: Once | ORAL | Status: DC
  Administered 2022-10-25: 40 meq via ORAL
  Filled 2022-10-25: qty 2

## 2022-10-25 MED ORDER — POTASSIUM CHLORIDE CRYS ER 20 MEQ PO TBCR: 40.0000 meq | EXTENDED_RELEASE_TABLET | Freq: Once | ORAL | Status: DC

## 2022-10-25 MED ORDER — PROPOFOL 500 MG/50ML IV EMUL
INTRAVENOUS | Status: DC | PRN
  Administered 2022-10-25: 140 ug/kg/min via INTRAVENOUS

## 2022-10-25 MED ORDER — FAMOTIDINE 20 MG PO TABS
20.0000 mg | ORAL_TABLET | Freq: Two times a day (BID) | ORAL | Status: DC
  Administered 2022-10-25 – 2022-10-27 (×5): 20 mg via ORAL
  Filled 2022-10-25 (×6): qty 1

## 2022-10-25 MED ORDER — LIDOCAINE 2% (20 MG/ML) 5 ML SYRINGE
INTRAMUSCULAR | Status: DC | PRN
  Administered 2022-10-25: 60 mg via INTRAVENOUS

## 2022-10-25 MED ORDER — DEXMEDETOMIDINE HCL IN NACL 80 MCG/20ML IV SOLN
INTRAVENOUS | Status: DC | PRN
  Administered 2022-10-25: 8 ug via INTRAVENOUS

## 2022-10-25 SURGICAL SUPPLY — 22 items

## 2022-10-25 NOTE — Transfer of Care (Signed)
Immediate Anesthesia Transfer of Care Note  Patient: RANEN DOOLIN  Procedure(s) Performed: COLONOSCOPY WITH PROPOFOL BIOPSY  Patient Location: PACU and Endoscopy Unit  Anesthesia Type:MAC  Level of Consciousness: awake, alert , oriented, and patient cooperative  Airway & Oxygen Therapy: Patient Spontanous Breathing and Patient connected to face mask oxygen  Post-op Assessment: Report given to RN, Post -op Vital signs reviewed and stable, and Patient moving all extremities  Post vital signs: Reviewed and stable  Last Vitals:  Vitals Value Taken Time  BP    Temp    Pulse    Resp    SpO2      Last Pain:  Vitals:   10/25/22 1220  TempSrc: Temporal  PainSc: 0-No pain         Complications: No notable events documented.

## 2022-10-25 NOTE — Inpatient Diabetes Management (Signed)
Inpatient Diabetes Program Recommendations  AACE/ADA: New Consensus Statement on Inpatient Glycemic Control (2015)  Target Ranges:  Prepandial:   less than 140 mg/dL      Peak postprandial:   less than 180 mg/dL (1-2 hours)      Critically ill patients:  140 - 180 mg/dL   Lab Results  Component Value Date   GLUCAP 197 (H) 10/24/2022   HGBA1C 12.1 (H) 10/23/2022    Review of Glycemic Control  Diabetes history: DM2 Outpatient Diabetes medications: amaryl 4 mg QAM, Jardiance 25 QAM, metformin 1000 mg BID, Trulicity 0.75 mg Q weekly Current orders for Inpatient glycemic control: Semglee 10 BID, Novolog 0-20 TID with meals and 0-5 HS On Solumedrol 40 every day  CBGs 7/21: 170-221 174 this am.  Inpatient Diabetes Program Recommendations:    Pt in Endo. Will see pt after Endo today or in am.   Thank you. Ailene Ards, RD, LDN, CDCES Inpatient Diabetes Coordinator [phone]

## 2022-10-25 NOTE — Progress Notes (Signed)
[facility]  PROGRESS NOTE  Richard Carroll UUV:[id number] DOB: 27-Sep-1970 DOA: 10/22/2022 PCP: Kristian Covey, MD   Brief HPI:    51 y.o. male with medical history significant for T2DM, HLD who presents to the ED for evaluation of diarrhea and vomiting.  Initially seen in the ED 7/15 for blood in his stool that began on 7/4.  C. difficile and GI pathogen panels were negative.  He was given IV fluids and short course of azithromycin is for possible infectious diarrhea.  He was seen again 7/16.  CT showed changes consistent with infectious or inflammatory colitis.  Antibiotics were changed to ciprofloxacin and Flagyl. He has been having continued diarrhea. He has been seeing some red blood mixed in the stool.   Assessment/Plan:    Diabetic ketoacidosis -Resolved, likely in setting of diarrhea; patient is also on Jardiance -Trulicity, Jardiance, Amaryl and metformin are on hold -DKA resolved, anion gap closed ; beta-hydroxybutyrate down to 0.31 -IV insulin was discontinued and patient started on Lantus 10 units subcu twice daily, -Started on resistant sliding scale as patient is currently on Solu-Medrol   Bloody diarrhea -CT abdomen/pelvis showed diffuse colitis -Ischemic colitis versus inflammatory bowel disease -Started on Solu-Medrol, mesalamine -Antibiotics were discontinued -[facility] gastro[facility]ology  consulted, plan for colonoscopy  -Stool for C. difficile PCR and GI pathogen panel obtained on 7/15 are negative  Hypokalemia -Potassium is 3.2 -Replace potassium and follow BMP in am  Hyperlipidemia -Statin on hold   Medications     Chlorhexidine Gluconate Cloth  6 each Topical Daily   insulin aspart  0-20 Units Subcutaneous TID WC   insulin aspart  0-5 Units Subcutaneous QHS   insulin glargine-yfgn  10 Units Subcutaneous BID   mesalamine  2.4 g Oral BID WC   methylPREDNISolone (SOLU-MEDROL) injection  40 mg Intravenous Daily     Data Reviewed:    CBG:  Recent Labs  Lab 10/23/22 2123 10/24/22 0803 10/24/22 1131 10/24/22 1610 10/24/22 2202  GLUCAP 162* 180* 221* 170* 197*    SpO2: 96 %    Vitals:   10/24/22 2000 10/25/22 0000 10/25/22 0400 10/25/22 0705  BP: (!) 146/74 (!) 162/80 126/75   Pulse:  75 73   Resp: 20 18 16    Temp:  97.9 F (36.6 C) 97.7 F (36.5 C) 98 F (36.7 C)  TempSrc:  Oral Oral Oral  SpO2:  96% 96%   Weight:      Height:          Data Reviewed:  Basic Metabolic Panel: Recent Labs  Lab 10/23/22 0619 10/23/22 0927 10/23/22 1646 10/24/22 0320 10/25/22 0300  NA 139 137 139 137 136  K 3.0* 3.4* 3.2* 3.3* 3.2*  CL 116* 116* 113* 109 106  CO2 18* 16* 19* 18* 21*  GLUCOSE 150* 141* 177* 182* 174*  BUN 11 11 10 10 10   CREATININE 0.50* 0.71 0.68 0.70 0.63  CALCIUM 7.7* 7.6* 7.7* 7.6* 7.6*  MG 1.8  --   --  1.8  --     CBC: Recent Labs  Lab 10/18/22 0938 10/19/22 1702 10/22/22 1730 10/23/22 0221 10/25/22 0300  WBC 8.9 12.2* 14.8* 13.1* 11.2*  NEUTROABS 5.8 8.3* 10.4*  --   --   HGB 14.2 14.0 16.7 14.1 13.2  HCT 41.6 41.7 51.1 42.9 39.1  MCV 90.0 91.9 92.9 92.3 90.7  PLT 280 322 443* 419* 360    LFT Recent Labs  Lab 10/18/22 0938 10/19/22 1702 10/22/22 1736 10/25/22 0300  AST 18 11* 9* 19  ALT 21 14 14 12   ALKPHOS 84 74 69 52  BILITOT 0.5 0.8 1.2 0.6  PROT 6.1* 6.0* 6.5 5.3*  ALBUMIN 2.9* 2.7* 2.8* 2.1*     Antibiotics: Anti-infectives (From admission, onward)    Start     Dose/Rate Route Frequency Ordered Stop   10/23/22 0900  metroNIDAZOLE (FLAGYL) IVPB 500 mg  Status:  Discontinued        500 mg 100 mL/hr over 60 Minutes Intravenous Every 12 hours 10/22/22 2213 10/23/22 0846   10/23/22 0900  metroNIDAZOLE (FLAGYL) IVPB 500 mg  Status:  Discontinued        500 mg 100 mL/hr over 60 Minutes Intravenous 2 times daily 10/23/22 0846 10/23/22 1501   10/22/22 2300  cefTRIAXone (ROCEPHIN) 2 g in sodium chloride 0.9 % 100 mL IVPB  Status:  Discontinued        2  g 200 mL/hr over 30 Minutes Intravenous Every 24 hours 10/22/22 2213 10/23/22 1501   10/22/22 1915  azithromycin (ZITHROMAX) 500 mg in sodium chloride 0.9 % 250 mL IVPB  Status:  Discontinued        500 mg 250 mL/hr over 60 Minutes Intravenous  Once 10/22/22 1906 10/22/22 2213   10/22/22 1915  ciprofloxacin (CIPRO) IVPB 400 mg        400 mg 200 mL/hr over 60 Minutes Intravenous  Once 10/22/22 1906 10/22/22 2025   10/22/22 1915  metroNIDAZOLE (FLAGYL) IVPB 500 mg        500 mg 100 mL/hr over 60 Minutes Intravenous  Once 10/22/22 1906 10/22/22 2149        DVT prophylaxis: Lantus  Code Status: Full code  Family Communication: Discussed with wife at bedside   CONSULTS gastroenterology   Subjective    Diarrhea has improved  Objective    Physical Examination:   Appears in no acute distress S1-S2, regular Abdomen soft, nontender  Status is: Inpatient:             Meredeth Ide   [facility] If 7PM-7AM, please contact night-coverage at [email or web address], Office  [phone]   10/25/2022, 8:09 AM  LOS: 3 days

## 2022-10-25 NOTE — Op Note (Addendum)
[facility] Patient Name: Richard Carroll Procedure Date: 10/25/2022 MRN: [id number] Attending MD: Meryl Dare , MD, [phone] Date of Birth: 30-Oct-1970 CSN: [id number] Age: 52 Admit Type: Inpatient Procedure:                Colonoscopy Indications:              Diarrhea, Hematochezia, Suspected Crohn's disease                            of the colon Providers:                Venita Lick. Russella Dar, MD, Adin Hector, RN, Rogue Jury, RN, Marja Kays, Technician,                            Kandice Robinsons, Technician, Lauree Chandler [place],                            CRNA Referring MD:             [facility] Medicines:                Monitored Anesthesia Care Complications:            No immediate complications. Estimated blood loss:                            None. Estimated Blood Loss:     Estimated blood loss: none. Procedure:                Pre-Anesthesia Assessment:                           - Prior to the procedure, a History and Physical                            was performed, and patient medications and                            allergies were reviewed. The patient's tolerance of                            previous anesthesia was also reviewed. The risks                            and benefits of the procedure and the sedation                            options and risks were discussed with the patient.                            All questions were answered, and informed consent                            was obtained. Prior Anticoagulants: The patient has  taken no anticoagulant or antiplatelet agents. ASA                            Grade Assessment: II - A patient with mild systemic                            disease. After reviewing the risks and benefits,                            the patient was deemed in satisfactory condition to                            undergo the procedure.                            After obtaining informed consent, the colonoscope                            was passed under direct vision. Throughout the                            procedure, the patient's blood pressure, pulse, and                            oxygen saturations were monitored continuously. The                            CF-HQ190L ([id number]) Olympus colonoscope was                            introduced through the anus and advanced to the the                            terminal ileum, with identification of the                            appendiceal orifice and IC valve. The terminal                            ileum, ileocecal valve, appendiceal orifice, and                            rectum were photographed. The quality of the bowel                            preparation was fair. The colonoscopy was performed                            without difficulty. The patient tolerated the                            procedure well. Scope In: 1:19:18 PM Scope Out: 1:38:32 PM Scope Withdrawal Time: 0 hours 16 minutes 13 seconds  Total Procedure Duration: 0 hours 19 minutes  14 seconds  Findings:      The perianal and digital rectal examinations were normal.      The terminal ileum appeared normal. Biopsies were taken with a cold       forceps for histology.      Inflammation characterized by altered vascularity, erythema, friability,       granularity, loss of vascularity and shallow ulcerations was found in a       continuous and circumferential pattern from the rectum to the hepatic       flexure and at the IC valve. The mid ascending colon and the cecum were       spared. The inflammation was severe, and when compared to previous       examinations, the findings are worsened. Biopsies were taken with a cold       forceps for histology.      The exam was otherwise without abnormality on direct and retroflexion       views. Impression:               - Preparation of the colon was fair.                            - The examined portion of the ileum was normal.                            Biopsied.                           - Crohn's disease with colonic involvement.                            Inflammation was found from the rectum to the                            hepatic flexure and at the IC valve. This was                            severe, worsened compared to previous examinations.                            Biopsied.                           - The examination was otherwise normal on direct                            and retroflexion views. Moderate Sedation:      Not Applicable - Patient had care per Anesthesia. Recommendation:           - Repeat colonoscopy after studies are complete for                            surveillance based on pathology results per Dr.                            Barron Alvine.                           -  Patient has a contact number available for                            emergencies. The signs and symptoms of potential                            delayed complications were discussed with the                            patient. Return to normal activities tomorrow.                            Written discharge instructions were provided to the                            patient.                           - Resume previous diet.                           - Continue present medications.                           - Consider Remicade, Rinvoq and other options.                           - Await pathology results. Procedure Code(s):        --- Professional ---                           [phone], Colonoscopy, flexible; with biopsy, single                            or multiple Diagnosis Code(s):        --- Professional ---                           K50.10, Crohn's disease of large intestine without                            complications                           R19.7, Diarrhea, unspecified                           K92.1, Melena (includes Hematochezia) CPT copyright  2022 [employer]. All rights reserved. The codes documented in this report are preliminary and upon coder review may  be revised to meet current compliance requirements. Meryl Dare, MD 10/25/2022 1:46:42 PM This report has been signed electronically. Number of Addenda: 0

## 2022-10-25 NOTE — Interval H&P Note (Signed)
History and Physical Interval Note:  10/25/2022 1:07 PM  Richard Carroll  has presented today for surgery, with the diagnosis of Crohn's colitis.  The various methods of treatment have been discussed with the patient and family. After consideration of risks, benefits and other options for treatment, the patient has consented to  Procedure(s): COLONOSCOPY WITH PROPOFOL (N/A) as a surgical intervention.  The patient's history has been reviewed, patient examined, no change in status, stable for surgery.  I have reviewed the patient's chart and labs.  Questions were answered to the patient's satisfaction.     Venita Lick. Russella Dar

## 2022-10-25 NOTE — Anesthesia Procedure Notes (Signed)
Procedure Name: MAC Date/Time: 10/25/2022 1:10 PM  Performed by: Elisabeth Cara, CRNAPre-anesthesia Checklist: Patient identified, Emergency Drugs available, Suction available, Patient being monitored and Timeout performed Patient Re-evaluated:Patient Re-evaluated prior to induction Oxygen Delivery Method: Simple face mask Placement Confirmation: positive ETCO2 Dental Injury: Teeth and Oropharynx as per pre-operative assessment

## 2022-10-26 ENCOUNTER — Telehealth: Payer: Self-pay | Admitting: Pharmacy Technician

## 2022-10-26 ENCOUNTER — Other Ambulatory Visit (HOSPITAL_COMMUNITY): Payer: Self-pay

## 2022-10-26 ENCOUNTER — Encounter (HOSPITAL_COMMUNITY): Payer: Self-pay | Admitting: Gastroenterology

## 2022-10-26 ENCOUNTER — Telehealth: Payer: Self-pay

## 2022-10-26 ENCOUNTER — Ambulatory Visit: Payer: BC Managed Care – PPO | Admitting: Family Medicine

## 2022-10-26 ENCOUNTER — Ambulatory Visit: Payer: BC Managed Care – PPO | Admitting: Physician Assistant

## 2022-10-26 DIAGNOSIS — K50111 Crohn's disease of large intestine with rectal bleeding: Secondary | ICD-10-CM | POA: Diagnosis not present

## 2022-10-26 DIAGNOSIS — E1110 Type 2 diabetes mellitus with ketoacidosis without coma: Secondary | ICD-10-CM | POA: Diagnosis not present

## 2022-10-26 DIAGNOSIS — R197 Diarrhea, unspecified: Secondary | ICD-10-CM

## 2022-10-26 DIAGNOSIS — K5010 Crohn's disease of large intestine without complications: Secondary | ICD-10-CM | POA: Diagnosis not present

## 2022-10-26 DIAGNOSIS — E876 Hypokalemia: Secondary | ICD-10-CM | POA: Diagnosis not present

## 2022-10-26 DIAGNOSIS — K529 Noninfective gastroenteritis and colitis, unspecified: Secondary | ICD-10-CM | POA: Diagnosis not present

## 2022-10-26 DIAGNOSIS — K921 Melena: Secondary | ICD-10-CM | POA: Diagnosis not present

## 2022-10-26 LAB — GLUCOSE, CAPILLARY
Glucose-Capillary: 129 mg/dL — ABNORMAL HIGH (ref 70–99)
Glucose-Capillary: 110 mg/dL — ABNORMAL HIGH (ref 70–99)
Glucose-Capillary: 139 mg/dL — ABNORMAL HIGH (ref 70–99)
Glucose-Capillary: 107 mg/dL — ABNORMAL HIGH (ref 70–99)
Glucose-Capillary: 201 mg/dL — ABNORMAL HIGH (ref 70–99)
Glucose-Capillary: 261 mg/dL — ABNORMAL HIGH (ref 70–99)

## 2022-10-26 LAB — CBC
HCT: 37.3 % — ABNORMAL LOW (ref 39.0–52.0)
Hemoglobin: 12.7 g/dL — ABNORMAL LOW (ref 13.0–17.0)
MCH: 30.8 pg (ref 26.0–34.0)
MCHC: 34 g/dL (ref 30.0–36.0)
MCV: 90.3 fL (ref 80.0–100.0)
Platelets: 364 10*3/uL (ref 150–400)
RBC: 4.13 MIL/uL — ABNORMAL LOW (ref 4.22–5.81)
RDW: 13.1 % (ref 11.5–15.5)
WBC: 13.5 10*3/uL — ABNORMAL HIGH (ref 4.0–10.5)
nRBC: 0 % (ref 0.0–0.2)

## 2022-10-26 LAB — BASIC METABOLIC PANEL
Anion gap: 7 (ref 5–15)
BUN: 9 mg/dL (ref 6–20)
CO2: 26 mmol/L (ref 22–32)
Calcium: 7.5 mg/dL — ABNORMAL LOW (ref 8.9–10.3)
Chloride: 104 mmol/L (ref 98–111)
Creatinine, Ser: 0.69 mg/dL (ref 0.61–1.24)
GFR, Estimated: >60 mL/min (ref >60)
Glucose, Bld: 216 mg/dL — ABNORMAL HIGH (ref 70–99)
Potassium: 3.2 mmol/L — ABNORMAL LOW (ref 3.5–5.1)
Sodium: 137 mmol/L (ref 135–145)

## 2022-10-26 MED ORDER — POTASSIUM CHLORIDE 20 MEQ PO PACK
40.0000 meq | PACK | ORAL | Status: AC
  Administered 2022-10-26 (×2): 40 meq via ORAL
  Filled 2022-10-26 (×2): qty 2

## 2022-10-26 NOTE — Telephone Encounter (Signed)
PA has been submitted and separate telephone encounter has been created

## 2022-10-26 NOTE — Anesthesia Postprocedure Evaluation (Signed)
Anesthesia Post Note  Patient: Richard Carroll  Procedure(s) Performed: COLONOSCOPY WITH PROPOFOL BIOPSY     Patient location during evaluation: PACU Anesthesia Type: MAC Level of consciousness: awake and alert Pain management: pain level controlled Vital Signs Assessment: post-procedure vital signs reviewed and stable Respiratory status: spontaneous breathing, nonlabored ventilation and respiratory function stable Cardiovascular status: blood pressure returned to baseline and stable Postop Assessment: no apparent nausea or vomiting Anesthetic complications: no   No notable events documented.  Last Vitals:  Vitals:   10/26/22 0000 10/26/22 0400  BP: 125/71 120/61  Pulse: 67 (!) 58  Resp: (!) 8 14  Temp:  36.8 C  SpO2: 97% 97%    Last Pain:  Vitals:   10/26/22 0729  TempSrc:   PainSc: 0-No pain   Pain Goal:                   Lannie Fields

## 2022-10-26 NOTE — Progress Notes (Signed)
[facility]  PROGRESS NOTE  Richard Carroll FBP:[id number] DOB: 31-Dec-1970 DOA: 10/22/2022 PCP: Kristian Covey, MD   Brief HPI:    52 y.o. male with medical history significant for T2DM, HLD who presents to the ED for evaluation of diarrhea and vomiting.  Initially seen in the ED 7/15 for blood in his stool that began on 7/4.  C. difficile and GI pathogen panels were negative.  He was given IV fluids and short course of azithromycin is for possible infectious diarrhea.  He was seen again 7/16.  CT showed changes consistent with infectious or inflammatory colitis.  Antibiotics were changed to ciprofloxacin and Flagyl. He has been having continued diarrhea. He has been seeing some red blood mixed in the stool.   Assessment/Plan:    Diabetic ketoacidosis/diabetes mellitus type 2 -Resolved, likely in setting of diarrhea; patient is also on Jardiance -Trulicity, Jardiance, Amaryl and metformin are on hold -DKA resolved, anion gap closed ; beta-hydroxybutyrate down to 0.31 -IV insulin was discontinued and patient started on Lantus 10 units subcu twice daily, -Started on resistant sliding scale as patient is currently on Solu-Medrol  -CBG well-controlled  Bloody diarrhea/?  Crohn's colitis -CT abdomen/pelvis showed diffuse colitis; underwent colonoscopy yesterday.  Findings suspicious for Crohn's, biopsy obtained -Follow-up biopsy results -Started on Solu-Medrol, mesalamine -Antibiotics were discontinued -[facility] gastroenterology  consulted -Stool for C. difficile PCR and GI pathogen panel obtained on 7/15 are negative  Hypokalemia -Potassium is 3.2 -Replace potassium and follow BMP in am  Hyperlipidemia -Statin on hold   Medications     Chlorhexidine Gluconate Cloth  6 each Topical Daily   famotidine  20 mg Oral BID   insulin aspart  0-20 Units Subcutaneous TID WC   insulin aspart  0-5 Units Subcutaneous QHS   insulin glargine-yfgn  10 Units Subcutaneous BID    mesalamine  2.4 g Oral BID WC   methylPREDNISolone (SOLU-MEDROL) injection  40 mg Intravenous Daily   potassium chloride  40 mEq Oral Once     Data Reviewed:   CBG:  Recent Labs  Lab 10/25/22 1200 10/25/22 1355 10/25/22 1635 10/25/22 2143 10/26/22 0731  GLUCAP 110* 115* 139* 261* 107*    SpO2: 97 % O2 Flow Rate (L/min): 8 L/min    Vitals:   10/25/22 2000 10/25/22 2359 10/26/22 0000 10/26/22 0400  BP: 123/83  125/71 120/61  Pulse: 73  67 (!) 58  Resp: 10  (!) 8 14  Temp: 98.6 F (37 C) 98.5 F (36.9 C)  98.3 F (36.8 C)  TempSrc: Oral Oral  Oral  SpO2: 98%  97% 97%  Weight:      Height:          Data Reviewed:  Basic Metabolic Panel: Recent Labs  Lab 10/23/22 0619 10/23/22 0927 10/23/22 1646 10/24/22 0320 10/25/22 0300  NA 139 137 139 137 136  K 3.0* 3.4* 3.2* 3.3* 3.2*  CL 116* 116* 113* 109 106  CO2 18* 16* 19* 18* 21*  GLUCOSE 150* 141* 177* 182* 174*  BUN 11 11 10 10 10   CREATININE 0.50* 0.71 0.68 0.70 0.63  CALCIUM 7.7* 7.6* 7.7* 7.6* 7.6*  MG 1.8  --   --  1.8  --     CBC: Recent Labs  Lab 10/19/22 1702 10/22/22 1730 10/23/22 0221 10/25/22 0300  WBC 12.2* 14.8* 13.1* 11.2*  NEUTROABS 8.3* 10.4*  --   --   HGB 14.0 16.7 14.1 13.2  HCT 41.7 51.1 42.9 39.1  MCV 91.9  92.9 92.3 90.7  PLT 322 443* 419* 360    LFT Recent Labs  Lab 10/19/22 1702 10/22/22 1736 10/25/22 0300  AST 11* 9* 19  ALT 14 14 12   ALKPHOS 74 69 52  BILITOT 0.8 1.2 0.6  PROT 6.0* 6.5 5.3*  ALBUMIN 2.7* 2.8* 2.1*     Antibiotics: Anti-infectives (From admission, onward)    Start     Dose/Rate Route Frequency Ordered Stop   10/23/22 0900  metroNIDAZOLE (FLAGYL) IVPB 500 mg  Status:  Discontinued        500 mg 100 mL/hr over 60 Minutes Intravenous Every 12 hours 10/22/22 2213 10/23/22 0846   10/23/22 0900  metroNIDAZOLE (FLAGYL) IVPB 500 mg  Status:  Discontinued        500 mg 100 mL/hr over 60 Minutes Intravenous 2 times daily 10/23/22 0846 10/23/22  1501   10/22/22 2300  cefTRIAXone (ROCEPHIN) 2 g in sodium chloride 0.9 % 100 mL IVPB  Status:  Discontinued        2 g 200 mL/hr over 30 Minutes Intravenous Every 24 hours 10/22/22 2213 10/23/22 1501   10/22/22 1915  azithromycin (ZITHROMAX) 500 mg in sodium chloride 0.9 % 250 mL IVPB  Status:  Discontinued        500 mg 250 mL/hr over 60 Minutes Intravenous  Once 10/22/22 1906 10/22/22 2213   10/22/22 1915  ciprofloxacin (CIPRO) IVPB 400 mg        400 mg 200 mL/hr over 60 Minutes Intravenous  Once 10/22/22 1906 10/22/22 2025   10/22/22 1915  metroNIDAZOLE (FLAGYL) IVPB 500 mg        500 mg 100 mL/hr over 60 Minutes Intravenous  Once 10/22/22 1906 10/22/22 2149        DVT prophylaxis: SCDs  Code Status: Full code  Family Communication: Discussed with wife at bedside   CONSULTS gastroenterology   Subjective   Diarrhea is slowing down.  Less bloody   Objective    Physical Examination:  General-appears in no acute distress  Status is: Inpatient:             Meredeth Ide   [facility] If 7PM-7AM, please contact night-coverage at [email or web address], Office  [phone]   10/26/2022, 8:15 AM  LOS: 4 days

## 2022-10-26 NOTE — Plan of Care (Signed)

## 2022-10-26 NOTE — Inpatient Diabetes Management (Signed)
Inpatient Diabetes Program Recommendations  AACE/ADA: New Consensus Statement on Inpatient Glycemic Control (2015)  Target Ranges:  Prepandial:   less than 140 mg/dL      Peak postprandial:   less than 180 mg/dL (1-2 hours)      Critically ill patients:  140 - 180 mg/dL   Lab Results  Component Value Date   GLUCAP 107 (H) 10/26/2022   HGBA1C 12.1 (H) 10/23/2022    Review of Glycemic Control  Diabetes history: DM2 Outpatient Diabetes medications: Amaryl 4 mg every day, Jardiance 25 mg QAM, metformin 500 mg BID Current orders for Inpatient glycemic control: Semglee 10 BID, Novolog 0-20 TID with meals and 0-5 HS  HgbA1C - 12.1%, 7.1% on 04/20/2022 107 mg/dL this am.  CBGs trending well, in setting of steroids. Previously on Trulicity, although has not taken in the past 2-3 months d/t supply issues.  Inpatient Diabetes Program Recommendations:    Agree with orders.  Spoke with pt at bedside regarding his diabetes and HgbA1C of 12.1% (average blood sugar of 301 mg/dL). Pt states his usual HgbA1C is between 7-8%. Discussed glucose and HgbA1C goals. Monitors blood sugars infrequently, usually in the am. Have asked Pharmacy about CGM coverage with his insurance. Pt states he drinks a regular soda each morning and small glass of OJ. We discussed healthier options with 0 CHOs. Has lost approximately 20 pounds in the last 2-3 weeks. States he tries to eat healthy most of the time. Stressed importance of getting HgbA1C down to reduce risks of both long and short-term complications. Discussed impact of nutrition, exercise, stress, sickness, and medications on diabetes control. Per Pharmacy, Josephine Igo is not covered and Dexcom requires PA. Answered all questions and spoke with wife while she was on phone with pt. Will likely be discharged on Trulicity at lower dose, since has not taken in a while.  May need small amount of insulin at discharge until pt is able to resume Trulicity and  metformin.  Continue to follow.  Thank you. Ailene Ards, RD, LDN, CDCES Inpatient Diabetes Coordinator [phone]

## 2022-10-26 NOTE — Telephone Encounter (Signed)
Pharmacy Patient Advocate Encounter   Received notification from Pt Calls Messages that prior authorization for RINVOQ 45MG  is required/requested.   Insurance verification completed.   The patient is insured through [facility] .   Per test claim: PA submitted to [facility] via CoverMyMeds Key/confirmation #/EOC [id number] Status is pending

## 2022-10-26 NOTE — TOC Benefit Eligibility Note (Signed)
Pharmacy Patient Advocate Encounter  Insurance verification completed.    The patient is insured through [employer]   Ran test claim for [employer] and Requires Prior Authorization  Ran test claim for [employer] and Product Not Covered  This test claim was processed through [employer]- copay amounts may vary at other pharmacies due to [employer], or as the patient moves through the different stages of their insurance plan.    Roland Earl, CPHT Pharmacy Patient Advocate Specialist [facility] Patient Advocate Team Direct Number: [phone]  Fax: [phone]

## 2022-10-26 NOTE — Telephone Encounter (Signed)
PA team, will you all please initiate an URGENT PA for Rinvoq 45 mg for 8 weeks then Rinvoq 30 mg daily (maintenance dose).   I will send prescription once we have a response from insurance company.

## 2022-10-26 NOTE — Progress Notes (Addendum)
Daily Progress Note  DOA: 10/22/2022 Hospital Day: 5 Chief Complaint: suspected Crohn's disease   Assessment and Plan:    Brief Narrative:  Richard Carroll is a 52 y.o. year old male with a history of diabetes, diverticulosis, colon polyps and FMH of CRC. In 2022 he had active inflammatory changes in colon on colonoscopy , no chronic changes and inflammatory markers were normal. Based on lack of symptoms, normal inflammatory markers, therapy not initiated. Currently admitted with bloody diarrhea and colitis on CT scan. Elevated CRP. C-diff negative,      Blood diarrhea / colitis on CT scan and findings suspicious for severe Crohn's colitis on colonoscopy 7/22 -Continue Mesalamine 2.4 grams BID -Quantiferon gold pending -Hepatitis B serology negative -Continue solumedrol 40 mg daily. Given DM the goal is to get him off steroids as soon as possible. Will need Remicade, Rinvoq or other IBD treatment if biopsies positive for IBD.  -No currently wearing SCDs. Advised to wear SCDs when in bed as he is at increased risk for DVTs. -Feeling better. Tolerated all of clear liquid breakfast and wants to advance. Will try soft diet.      Attending Physician Note   I have taken an interval history, reviewed the chart and examined the patient. I performed a substantive portion of this encounter, including complete performance of at least one of the key components, in conjunction with the APP. I agree with the APP's note, impression and recommendations with my edits. My additional impressions and recommendations are as follows.   Severe colitis c/w Crohn's colitis - improving on mesalamine and SoluMedrol. Advanced to soft diet today. Pathology and Juanetta Gosling are pending. Rinvoq, Remicade or other IBD therapy pending path review and availability on his insurance plan.   DKA resolved, DM2 with mgmt complicated by Solumedrol  Hypokalemia   Claudette Head, MD [facility] See AMION, [facility],  for our on call provider     Subjective / New Events:   Feeling better. Less frequent BMs, less blood. No significant abdominal pain. Interested in advancing from clear liquids.   10/25/22 colonoscopy  Fair prep Preparation of the colon was fair. - The examined portion of the ileum was normal. Biopsied. - Crohn's disease with colonic involvement. Inflammation was found from the rectum to the hepatic flexure and at the IC valve. This was severe, worsened compared to previous examinations. Biopsied. - The examination was otherwise normal on direct and retroflexion views.  Objective:   Recent Labs    10/25/22 0300  WBC 11.2*  HGB 13.2  HCT 39.1  PLT 360   BMET Recent Labs    10/23/22 1646 10/24/22 0320 10/25/22 0300  NA 139 137 136  K 3.2* 3.3* 3.2*  CL 113* 109 106  CO2 19* 18* 21*  GLUCOSE 177* 182* 174*  BUN 10 10 10   CREATININE 0.68 0.70 0.63  CALCIUM 7.7* 7.6* 7.6*   LFT Recent Labs    10/25/22 0300  PROT 5.3*  ALBUMIN 2.1*  AST 19  ALT 12  ALKPHOS 52  BILITOT 0.6    CRP 6.9 Fecal calprotectin pending  Imaging:  CT ABDOMEN PELVIS W CONTRAST CLINICAL DATA:  Acute nonlocalized abdominal pain. Diarrhea with rectal bleeding for 3 weeks. Nausea, fatigue, weakness, and abdominal pain. Recent weight loss.  EXAM: CT ABDOMEN AND PELVIS WITH CONTRAST  TECHNIQUE: Multidetector CT imaging of the abdomen and pelvis was performed using the standard protocol following bolus administration of intravenous contrast.  RADIATION DOSE REDUCTION:  This exam was performed according to the departmental dose-optimization program which includes automated exposure control, adjustment of the mA and/or kV according to patient size and/or use of iterative reconstruction technique.  CONTRAST:  OMNIPAQUE IOHEXOL 300 MG/ML  SOLN  COMPARISON:  10/19/2022 and 12/15/2015  FINDINGS: Lower chest: Lung bases are clear.  Hepatobiliary: Mild diffuse fatty infiltration of the  liver. No focal lesions. Gallbladder and bile ducts are unremarkable.  Pancreas: Unremarkable. No pancreatic ductal dilatation or surrounding inflammatory changes.  Spleen: Normal in size without focal abnormality.  Adrenals/Urinary Tract: No adrenal gland nodules. Kidneys are symmetrical. Nephrograms are homogeneous. No hydronephrosis or hydroureter. Bladder is normal.  Stomach/Bowel: Stomach and small bowel are mostly decompressed. As seen previously, there is diffuse wall thickening throughout the colon beginning at the transverse colon and extending through the descending colon, sigmoid colon, and rectum. Changes are most likely represent infectious or inflammatory colitis. Consider Crohn's disease or pseudomembranous colitis versus infectious colitis. Scattered stool in the colon. Appendix is normal.  Vascular/Lymphatic: Mild aortic atherosclerosis. No enlarged abdominal or pelvic lymph nodes.  Reproductive: Prostate is unremarkable.  Other: Minimal residual fluid in the pelvis, resolving since prior study. No free air.  Musculoskeletal: Degenerative changes in the spine. No acute bony abnormalities.  IMPRESSION: 1. Diffuse colonic wall thickening/edema involving the transverse, descending, and rectosigmoid colon, similar to prior study. Changes likely to represent infectious or inflammatory colitis. 2. No evidence of bowel obstruction. 3. Fatty infiltration of the liver. 4. Mild aortic atherosclerosis.  Electronically Signed   By: Burman Nieves M.D.   On: 10/22/2022 20:15    Scheduled inpatient medications:   Chlorhexidine Gluconate Cloth  6 each Topical Daily   famotidine  20 mg Oral BID   insulin aspart  0-20 Units Subcutaneous TID WC   insulin aspart  0-5 Units Subcutaneous QHS   insulin glargine-yfgn  10 Units Subcutaneous BID   mesalamine  2.4 g Oral BID WC   methylPREDNISolone (SOLU-MEDROL) injection  40 mg Intravenous Daily   potassium chloride  40  mEq Oral Once   Continuous inpatient infusions:  PRN inpatient medications: acetaminophen, dextrose, morphine injection, ondansetron (ZOFRAN) IV, mouth rinse  Vital signs in last 24 hours: Temp:  [97.7 F (36.5 C)-98.6 F (37 C)] 98.3 F (36.8 C) (07/23 0400) Pulse Rate:  [58-82] 58 (07/23 0400) Resp:  [8-22] 14 (07/23 0400) BP: (104-153)/(61-86) 120/61 (07/23 0400) SpO2:  [96 %-100 %] 97 % (07/23 0400) Last BM Date : 10/26/22  Intake/Output Summary (Last 24 hours) at 10/26/2022 0853 Last data filed at 10/25/2022 1345 Gross per 24 hour  Intake 827.57 ml  Output --  Net 827.57 ml    Intake/Output from previous day: 07/22 0701 - 07/23 0700 In: 1004.9 [I.V.:1004.9] Out: -  Intake/Output this shift: No intake/output data recorded.   Physical Exam:  General: Alert male in NAD Heart:  Regular rate and rhythm.  Pulmonary: Normal respiratory effort Abdomen: Soft, nondistended, nontender. Normal bowel sounds. Extremities: No lower extremity edema  Neurologic: Alert and oriented Psych: Pleasant. Cooperative. Insight appears normal.     LOS: 4 days   Willette Cluster ,NP 10/26/2022, 8:53 AM

## 2022-10-27 ENCOUNTER — Other Ambulatory Visit (HOSPITAL_COMMUNITY): Payer: Self-pay

## 2022-10-27 ENCOUNTER — Telehealth (HOSPITAL_COMMUNITY): Payer: Self-pay | Admitting: Pharmacy Technician

## 2022-10-27 DIAGNOSIS — K921 Melena: Secondary | ICD-10-CM | POA: Diagnosis not present

## 2022-10-27 DIAGNOSIS — K5010 Crohn's disease of large intestine without complications: Secondary | ICD-10-CM | POA: Diagnosis not present

## 2022-10-27 DIAGNOSIS — D72829 Elevated white blood cell count, unspecified: Secondary | ICD-10-CM

## 2022-10-27 DIAGNOSIS — E876 Hypokalemia: Secondary | ICD-10-CM | POA: Diagnosis not present

## 2022-10-27 DIAGNOSIS — E1110 Type 2 diabetes mellitus with ketoacidosis without coma: Secondary | ICD-10-CM | POA: Diagnosis not present

## 2022-10-27 DIAGNOSIS — K529 Noninfective gastroenteritis and colitis, unspecified: Secondary | ICD-10-CM | POA: Diagnosis not present

## 2022-10-27 LAB — MAGNESIUM: Magnesium: 2 mg/dL (ref 1.7–2.4)

## 2022-10-27 LAB — BASIC METABOLIC PANEL
Anion gap: 12 (ref 5–15)
BUN: 9 mg/dL (ref 6–20)
CO2: 24 mmol/L (ref 22–32)
Calcium: 7.8 mg/dL — ABNORMAL LOW (ref 8.9–10.3)
Chloride: 103 mmol/L (ref 98–111)
Creatinine, Ser: 0.64 mg/dL (ref 0.61–1.24)
GFR, Estimated: >60 mL/min (ref >60)
Glucose, Bld: 83 mg/dL (ref 70–99)
Potassium: 3.2 mmol/L — ABNORMAL LOW (ref 3.5–5.1)
Sodium: 139 mmol/L (ref 135–145)

## 2022-10-27 LAB — QUANTIFERON-TB GOLD PLUS (RQFGPL)
QuantiFERON Mitogen Value: 3.85 IU/mL
QuantiFERON Nil Value: 0.05 IU/mL
QuantiFERON TB1 Ag Value: 0.06 IU/mL
QuantiFERON TB2 Ag Value: 0.07 IU/mL

## 2022-10-27 LAB — QUANTIFERON-TB GOLD PLUS: QuantiFERON-TB Gold Plus: NEGATIVE

## 2022-10-27 LAB — GLUCOSE, CAPILLARY
Glucose-Capillary: 202 mg/dL — ABNORMAL HIGH (ref 70–99)
Glucose-Capillary: 79 mg/dL (ref 70–99)
Glucose-Capillary: 262 mg/dL — ABNORMAL HIGH (ref 70–99)

## 2022-10-27 LAB — PHOSPHORUS: Phosphorus: 3.1 mg/dL (ref 2.5–4.6)

## 2022-10-27 LAB — CALPROTECTIN, FECAL: Calprotectin, Fecal: 2610 ug/g — ABNORMAL HIGH (ref 0–120)

## 2022-10-27 LAB — SURGICAL PATHOLOGY

## 2022-10-27 MED ORDER — CONTOUR TEST VI STRP: ORAL_STRIP | 0 refills | Status: AC

## 2022-10-27 MED ORDER — LANCETS MISC: 1.0000 | Freq: Three times a day (TID) | 0 refills | Status: AC

## 2022-10-27 MED ORDER — POTASSIUM CHLORIDE CRYS ER 20 MEQ PO TBCR: 40.0000 meq | EXTENDED_RELEASE_TABLET | Freq: Once | ORAL | Status: DC

## 2022-10-27 MED ORDER — BASAGLAR KWIKPEN 100 UNIT/ML ~~LOC~~ SOPN: 8.0000 [IU] | PEN_INJECTOR | Freq: Two times a day (BID) | SUBCUTANEOUS | 0 refills | Status: DC

## 2022-10-27 MED ORDER — PREDNISONE 20 MG PO TABS: 40.0000 mg | ORAL_TABLET | Freq: Every day | ORAL | 0 refills | Status: AC

## 2022-10-27 MED ORDER — LANCET DEVICE MISC: 1.0000 | Freq: Three times a day (TID) | 0 refills | Status: AC

## 2022-10-27 MED ORDER — DEXCOM G7 SENSOR MISC: 0 refills | Status: DC

## 2022-10-27 MED ORDER — INSULIN ASPART 100 UNIT/ML FLEXPEN: 0.0000 [IU] | PEN_INJECTOR | Freq: Three times a day (TID) | SUBCUTANEOUS | 0 refills | Status: DC

## 2022-10-27 MED ORDER — PEN NEEDLES 31G X 5 MM MISC: 1.0000 | Freq: Three times a day (TID) | 0 refills | Status: DC

## 2022-10-27 MED ORDER — MESALAMINE 1.2 G PO TBEC: 2.4000 g | DELAYED_RELEASE_TABLET | Freq: Two times a day (BID) | ORAL | 0 refills | Status: DC

## 2022-10-27 MED ORDER — BLOOD GLUCOSE MONITORING SUPPL DEVI: 1.0000 | Freq: Three times a day (TID) | 0 refills | Status: AC

## 2022-10-27 NOTE — Inpatient Diabetes Management (Addendum)
Inpatient Diabetes Program Recommendations  AACE/ADA: New Consensus Statement on Inpatient Glycemic Control (2015)  Target Ranges:  Prepandial:   less than 140 mg/dL      Peak postprandial:   less than 180 mg/dL (1-2 hours)      Critically ill patients:  140 - 180 mg/dL    Latest Reference Range & Units 04/20/22 09:31 10/23/22 02:21  Hemoglobin A1C 4.8 - 5.6 % 7.1 ! 12.1 (H)  300 mg/dl  !: Data is abnormal (H): Data is abnormally high  Latest Reference Range & Units 10/26/22 07:31 10/26/22 11:40 10/26/22 16:37 10/26/22 21:20  Glucose-Capillary 70 - 99 mg/dL 604 (H) 540 (H)  7 units Novolog  10 units Semglee 261 (H)  11 units Novolog  202 (H)  2 units Novolog  10 units Semglee  (H): Data is abnormally high  Latest Reference Range & Units 10/27/22 08:04  Glucose-Capillary 70 - 99 mg/dL 79     Admit with:  DKA Blood diarrhea / colitis on CT scan and findings suspicious for severe Crohn's colitis on colonoscopy 7/22   History: DM2  Home DM Meds: Amaryl 4 mg QAM    Jardiance 25 mg QAM    Metformin 1000 mg BID    Trulicity 0.75 mg Qweek   Current Orders: Semglee 10 units BID      Novolog Resistant Correction Scale/ SSI (0-20 units) TID AC + HS    Getting Solumedrol 40 mg Daily  Patient admitted with DKA and colitis.  Noted patient saw PCP on 10/13/22 with blood in stools; per office note patient was taking Amaryl, Jardiance, and Metformin for DM but had not taken Trulicity in 4 months due to not being available at pharmacy New Rx sent for Trulicity at 0.75 mg Qweek  May need Insulin for home?  MD- Note CBG 79 this AM.  CBGs yest afternoon >200 and pt allowed PO diet.   Please consider:  1. Reduce the Semglee slightly to 8 units BID  2. Start Novolog Meal Coverage: Novolog 3 units TID with meals HOLD if pt NPO HOLD if pt eats <50% meals    Addendum 12:15pm--Met w/ pt at bedside (teenage daughter present and wife also present for conversation on phone).  We  talked about his A1c and how he may need insulin for home at least temporarily to control CBGs while he is unable to take the Trulicity just yet.  Pt open and willing to take insulin.  Educated patient on insulin pen use at home.  Reviewed all steps of insulin pen including attachment of needle, 2-unit air shot, dialing up dose, giving injection, rotation of injection sites, removing needle, disposal of sharps, storage of unused insulin, disposal of insulin etc.  Patient able to provide successful return demonstration.  Reviewed troubleshooting with insulin pen.  Also reviewed Signs/Symptoms of Hypoglycemia with patient and how to treat Hypoglycemia at home.  Wife told me she has familiarity with Ozempic pen which is similar to Insulin pen.  Reminded pt and family that Insulin pens are multi-use and that pt will need to attach and de-attach pen needle with each injection and that pt will need to dial dose every time.  Pt had questions about dose--Discussed with pt that he will not have to guess at doses and that will be determined for d/c.  Have attached QR code on insulin pen use at home to AVS and told pt and wife about the QR code and how to use QR code.  Also discussed  using Dexcom G7 GCM at home.  I have told pt I will have Pharmacy complete the Prior Auth in case pt and his wife decide they want the Dexcom when he goes home.      --Will follow patient during hospitalization--  Ambrose Finland RN, MSN, CDCES Diabetes Coordinator Inpatient Glycemic Control Team Team Pager: [phone] (8a-5p)

## 2022-10-27 NOTE — Telephone Encounter (Signed)
Pharmacy Patient Advocate Encounter  Received notification from [facility] that Prior Authorization for Dexcom G7 Sensor has been APPROVED from 10/27/2022 to 10/27/2023. Ran test claim, Copay is $50.00.  PA #/Case ID/Reference #: [id number]

## 2022-10-27 NOTE — Discharge Summary (Signed)
Physician Discharge Summary  Richard Carroll:[id number] DOB: 1970-10-25 DOA: 10/22/2022  PCP: Kristian Covey, MD  Admit date: 10/22/2022 Discharge date: 10/27/2022  Admitted From: Home Discharge disposition: Home  Recommendations at discharge:  Continue mesalamine, prednisone as instructed.  Follow-up with GI as an outpatient. You been started on insulin regimen.  Once you are tapered off prednisone, your insulin requirement will be less and now. Monitor blood sugar level at home.  Follow-up with PCP for further adjustment.     Brief narrative: Richard Carroll is a 52 y.o. male with PMH significant for DM2, HLD, Crohn's disease Initially seen in the ED 7/15 for blood in stool starting 7/4.  C. difficile and GI pathogen panels were negative.  Given IV fluid and short course of azithromycin for possible infection. 7/16, seen again in the ED. CT showed changes consistent with infectious or inflammatory colitis.  Antibiotics were changed to ciprofloxacin and Flagyl. 7/19, patient returned back to ED with persistent symptoms.  Also reported some blood mixed in the stool.  Repeat CT scan showed findings similar to prior CT from 7/16 He was also noted to be in DKA with a low pH of 7.19, bicarb of 13.1, glucose over 300 and beta-hydroxybutyrate acid 6.44 Admitted to [facility] GI was consulted.  Started on IV steroids. 7/22, underwent colonoscopy-findings suspicious for severe Crohn's colitis  Subjective: Patient was seen and examined this morning.  Pleasant middle-aged Caucasian male.  Lying on bed.  Daughter at bedside. Last BM this morning.  Soft.  With some streaks of blood.  No abdominal pain.  Feels ready for discharge today. Remains hemodynamically stable, breathing on room air  Assessment and plan: Severe Crohn's colitis H/o Crohn's disease Presented with bloody diarrhea.  CT abdomen findings as above 7/22 colonoscopy showed severe Crohn's colitis.   GI started on  mesalamine 2.4 g twice daily Currently on Solu-Medrol 40 mg IV daily Colonoscopy biopsy pending GI planning for further treatments of IBD as well if biopsy positive.   Diarrhea much improved. Per GI recommendation, okay to discharge today.  Will continue mesalamine 2.4 g twice daily, prednisone 40 mg daily until patient is able to start Rinvoq.  GI to taper abdominal him off the prednisone after that.  To continue soft diet.  Diabetic ketoacidosis On admission, he was also noted to be in DKA with a low pH of 7.19, bicarb of 13.1, glucose over 300 and beta-hydroxybutyrate acid 6.44 Resolved with management per DKA protocol with insulin drip, IV fluid, electrolyte monitoring Currently on subcutaneous insulin as below.  Uncontrolled type 2 diabetes mellitus Hyperglycemia due to steroids. A1c 12.1 on 10/23/2022 PTA meds-Trulicity weekly (but not taken in a while), Amaryl 4 mg every day, Jardiance 25 mg QAM, metformin 500 mg BID  Currently on Lantus 10 units twice daily and SSI/Accu-Cheks Blood sugar level currently running elevated over 200 mostly due to IV steroids Diabetes care coordinator consult appreciated. Will discharge the patient on Lantus 8 units twice daily and sliding scale insulin.  Will keep the oral meds on hold for now. His insulin demand will be down after steroids tapered off.  Continue monitor blood sugar level at home Recent Labs  Lab 10/26/22 1140 10/26/22 1637 10/26/22 2120 10/27/22 0804 10/27/22 1237  GLUCAP 201* 261* 202* 79 262*   Hypokalemia Replacement given. Recent Labs  Lab 10/23/22 [phone] 10/23/22 0927 10/23/22 1646 10/24/22 0320 10/25/22 0300 10/26/22 1112 10/27/22 0705  K 3.0*   < > 3.2* 3.3* 3.2*  3.2* 3.2*  MG 1.8  --   --  1.8  --   --  2.0  PHOS  --   --   --   --   --   --  3.1   < > = values in this interval not displayed.   Hyperlipidemia Simvastatin on hold  Wounds:  -    Discharge Exam:   Vitals:   10/27/22 0330 10/27/22 0417  10/27/22 0800 10/27/22 1200  BP:  122/64 138/79 113/78  Pulse: 62 75 74 95  Resp: 16 12 (!) 21 17  Temp: 97.6 F (36.4 C)  97.9 F (36.6 C) 97.9 F (36.6 C)  TempSrc: Oral  Oral Oral  SpO2: 96% 94% 97% 96%  Weight:      Height:        Body mass index is 26.32 kg/m.  General exam: Pleasant, not in distress Skin: No rashes, lesions or ulcers. HEENT: Atraumatic, normocephalic, no obvious bleeding Lungs: Clear to auscultation bilaterally CVS: Regular rate and rhythm, no murmur GI/Abd soft, nontender, nondistended, bowel sound present CNS: Alert, awake, alert x 3 Psychiatry: Mood appropriate Extremities: No pedal edema, no calf tenderness  Follow ups:    Follow-up Information     Burchette, Elberta Fortis, MD Follow up.   Specialty: Family Medicine Contact information: [address] [place] [address] [phone]                 Discharge Instructions:   Discharge Instructions     Call MD for:  difficulty breathing, headache or visual disturbances   Complete by: As directed    Call MD for:  extreme fatigue   Complete by: As directed    Call MD for:  hives   Complete by: As directed    Call MD for:  persistant dizziness or light-headedness   Complete by: As directed    Call MD for:  persistant nausea and vomiting   Complete by: As directed    Call MD for:  severe uncontrolled pain   Complete by: As directed    Call MD for:  temperature >100.4   Complete by: As directed    Diet Carb Modified   Complete by: As directed    Discharge instructions   Complete by: As directed    Recommendations at discharge:   Continue mesalamine, prednisone as instructed.  Follow-up with GI as an outpatient.  You been started on insulin regimen.  Once you are tapered off prednisone, your insulin requirement will be less and now. Monitor blood sugar level at home.  Follow-up with PCP for further adjustment.    Discharge instructions for diabetes mellitus: Check  blood sugar 3 times a day and bedtime at home. If blood sugar running above 200 or less than 70 please call your MD to adjust insulin. If you notice signs and symptoms of hypoglycemia (low blood sugar) like jitteriness, confusion, thirst, tremor and sweating, please check blood sugar, drink sugary drink/biscuits/sweets to increase sugar level and call MD or return to ER.    General discharge instructions: Follow with Primary MD Kristian Covey, MD in 7 days  Please request your PCP  to go over your hospital tests, procedures, radiology results at the follow up. Please get your medicines reviewed and adjusted.  Your PCP may decide to repeat certain labs or tests as needed. Do not drive, operate heavy machinery, perform activities at heights, swimming or participation in water activities or provide baby sitting services if your  were admitted for syncope or siezures until you have seen by Primary MD or a Neurologist and advised to do so again. [place] Controlled Substance Reporting System database was reviewed. Do not drive, operate heavy machinery, perform activities at heights, swim, participate in water activities or provide baby-sitting services while on medications for pain, sleep and mood until your outpatient physician has reevaluated you and advised to do so again.  You are strongly recommended to comply with the dose, frequency and duration of prescribed medications. Activity: As tolerated with Full fall precautions use walker/cane & assistance as needed Avoid using any recreational substances like cigarette, tobacco, alcohol, or non-prescribed drug. If you experience worsening of your admission symptoms, develop shortness of breath, life threatening emergency, suicidal or homicidal thoughts you must seek medical attention immediately by calling 911 or calling your MD immediately  if symptoms less severe. You must read complete instructions/literature along with all the possible adverse  reactions/side effects for all the medicines you take and that have been prescribed to you. Take any new medicine only after you have completely understood and accepted all the possible adverse reactions/side effects.  Wear Seat belts while driving. You were cared for by a hospitalist during your hospital stay. If you have any questions about your discharge medications or the care you received while you were in the hospital after you are discharged, you can call the unit and ask to speak with the hospitalist or the covering physician. Once you are discharged, your primary care physician will handle any further medical issues. Please note that NO REFILLS for any discharge medications will be authorized once you are discharged, as it is imperative that you return to your primary care physician (or establish a relationship with a primary care physician if you do not have one).   Increase activity slowly   Complete by: As directed        Discharge Medications:   Allergies as of 10/27/2022   No Known Allergies      Medication List     STOP taking these medications    ciprofloxacin 500 MG tablet Commonly known as: CIPRO   glimepiride 4 MG tablet Commonly known as: AMARYL   Jardiance 25 MG Tabs tablet Generic drug: empagliflozin   metFORMIN 500 MG tablet Commonly known as: GLUCOPHAGE   metroNIDAZOLE 500 MG tablet Commonly known as: FLAGYL   Trulicity 0.75 MG/0.5ML Sopn Generic drug: Dulaglutide       TAKE these medications    Basaglar KwikPen 100 UNIT/ML Inject 8 Units into the skin 2 (two) times daily. May substitute as needed per insurance.   Blood Glucose Monitoring Suppl Devi 1 each by Does not apply route 3 (three) times daily. May dispense any manufacturer covered by patient's insurance.   Contour Test test strip Generic drug: glucose blood Use as instructed   Dexcom G7 Sensor Misc Dexcom glucose monitoring sensor   insulin aspart 100 UNIT/ML FlexPen Commonly  known as: NOVOLOG Inject 0-6 Units into the skin 3 (three) times daily with meals. Check Blood Glucose (BG) and inject per scale: BG <150= 0 unit; BG 150-200= 1 unit; BG 201-250= 2 unit; BG 251-300= 3 unit; BG 301-350= 4 unit; BG 351-400= 5 unit; BG >400= 6 unit and Call Primary Care.   Lancet Device Misc 1 each by Does not apply route 3 (three) times daily. May dispense any manufacturer covered by patient's insurance.   Lancets Misc 1 each by Does not apply route 3 (three) times daily. Use  as directed to check blood sugar. May dispense any manufacturer covered by patient's insurance and fits patient's device.   mesalamine 1.2 g EC tablet Commonly known as: LIALDA Take 2 tablets (2.4 g total) by mouth 2 (two) times daily with a meal.   ONE-A-DAY MENS PO Take 1 tablet by mouth daily.   Pen Needles 31G X 5 MM Misc 1 each by Does not apply route 3 (three) times daily. May dispense any manufacturer covered by patient's insurance.   predniSONE 20 MG tablet Commonly known as: DELTASONE Take 2 tablets (40 mg total) by mouth daily.   sildenafil 100 MG tablet Commonly known as: Viagra Take 0.5-1 tablets (50-100 mg total) by mouth daily as needed for erectile dysfunction.   simvastatin 80 MG tablet Commonly known as: ZOCOR TAKE 1/2 TABLET BY MOUTH EVERY DAY         The results of significant diagnostics from this hospitalization (including imaging, microbiology, ancillary and laboratory) are listed below for reference.    Procedures and Diagnostic Studies:   CT ABDOMEN PELVIS W CONTRAST  Result Date: 10/22/2022 CLINICAL DATA:  Acute nonlocalized abdominal pain. Diarrhea with rectal bleeding for 3 weeks. Nausea, fatigue, weakness, and abdominal pain. Recent weight loss. EXAM: CT ABDOMEN AND PELVIS WITH CONTRAST TECHNIQUE: Multidetector CT imaging of the abdomen and pelvis was performed using the standard protocol following bolus administration of intravenous contrast. RADIATION DOSE  REDUCTION: This exam was performed according to the departmental dose-optimization program which includes automated exposure control, adjustment of the mA and/or kV according to patient size and/or use of iterative reconstruction technique. CONTRAST:  OMNIPAQUE IOHEXOL 300 MG/ML  SOLN COMPARISON:  10/19/2022 and 12/15/2015 FINDINGS: Lower chest: Lung bases are clear. Hepatobiliary: Mild diffuse fatty infiltration of the liver. No focal lesions. Gallbladder and bile ducts are unremarkable. Pancreas: Unremarkable. No pancreatic ductal dilatation or surrounding inflammatory changes. Spleen: Normal in size without focal abnormality. Adrenals/Urinary Tract: No adrenal gland nodules. Kidneys are symmetrical. Nephrograms are homogeneous. No hydronephrosis or hydroureter. Bladder is normal. Stomach/Bowel: Stomach and small bowel are mostly decompressed. As seen previously, there is diffuse wall thickening throughout the colon beginning at the transverse colon and extending through the descending colon, sigmoid colon, and rectum. Changes are most likely represent infectious or inflammatory colitis. Consider Crohn's disease or pseudomembranous colitis versus infectious colitis. Scattered stool in the colon. Appendix is normal. Vascular/Lymphatic: Mild aortic atherosclerosis. No enlarged abdominal or pelvic lymph nodes. Reproductive: Prostate is unremarkable. Other: Minimal residual fluid in the pelvis, resolving since prior study. No free air. Musculoskeletal: Degenerative changes in the spine. No acute bony abnormalities. IMPRESSION: 1. Diffuse colonic wall thickening/edema involving the transverse, descending, and rectosigmoid colon, similar to prior study. Changes likely to represent infectious or inflammatory colitis. 2. No evidence of bowel obstruction. 3. Fatty infiltration of the liver. 4. Mild aortic atherosclerosis. Electronically Signed   By: Burman Nieves M.D.   On: 10/22/2022 20:15     Labs:   Basic  Metabolic Panel: Recent Labs  Lab 10/23/22 0619 10/23/22 0927 10/23/22 1646 10/24/22 0320 10/25/22 0300 10/26/22 1112 10/27/22 0705  NA 139   < > 139 137 136 137 139  K 3.0*   < > 3.2* 3.3* 3.2* 3.2* 3.2*  CL 116*   < > 113* 109 106 104 103  CO2 18*   < > 19* 18* 21* 26 24  GLUCOSE 150*   < > 177* 182* 174* 216* 83  BUN 11   < > 10 10  10 9 9   CREATININE 0.50*   < > 0.68 0.70 0.63 0.69 0.64  CALCIUM 7.7*   < > 7.7* 7.6* 7.6* 7.5* 7.8*  MG 1.8  --   --  1.8  --   --  2.0  PHOS  --   --   --   --   --   --  3.1   < > = values in this interval not displayed.   GFR Estimated Creatinine Clearance: 114.1 mL/min (by C-G formula based on SCr of 0.64 mg/dL). Liver Function Tests: Recent Labs  Lab 10/22/22 1736 10/25/22 0300  AST 9* 19  ALT 14 12  ALKPHOS 69 52  BILITOT 1.2 0.6  PROT 6.5 5.3*  ALBUMIN 2.8* 2.1*   Recent Labs  Lab 10/22/22 1736  LIPASE 22   No results for input(s): "AMMONIA" in the last 168 hours. Coagulation profile No results for input(s): "INR", "PROTIME" in the last 168 hours.  CBC: Recent Labs  Lab 10/22/22 1730 10/23/22 0221 10/25/22 0300 10/26/22 1112  WBC 14.8* 13.1* 11.2* 13.5*  NEUTROABS 10.4*  --   --   --   HGB 16.7 14.1 13.2 12.7*  HCT 51.1 42.9 39.1 37.3*  MCV 92.9 92.3 90.7 90.3  PLT 443* 419* 360 364   Cardiac Enzymes: No results for input(s): "CKTOTAL", "CKMB", "CKMBINDEX", "TROPONINI" in the last 168 hours. BNP: Invalid input(s): "POCBNP" CBG: Recent Labs  Lab 10/26/22 1140 10/26/22 1637 10/26/22 2120 10/27/22 0804 10/27/22 1237  GLUCAP 201* 261* 202* 79 262*   D-Dimer No results for input(s): "DDIMER" in the last 72 hours. Hgb A1c No results for input(s): "HGBA1C" in the last 72 hours. Lipid Profile No results for input(s): "CHOL", "HDL", "LDLCALC", "TRIG", "CHOLHDL", "LDLDIRECT" in the last 72 hours. Thyroid function studies No results for input(s): "TSH", "T4TOTAL", "T3FREE", "THYROIDAB" in the last 72  hours.  Invalid input(s): "FREET3" Anemia work up No results for input(s): "VITAMINB12", "FOLATE", "FERRITIN", "TIBC", "IRON", "RETICCTPCT" in the last 72 hours. Microbiology Recent Results (from the past 240 hour(s))  Gastrointestinal Panel by PCR , Stool     Status: None   Collection Time: 10/18/22  9:38 AM   Specimen: Stool  Result Value Ref Range Status   Campylobacter species NOT DETECTED NOT DETECTED Final   Plesimonas shigelloides NOT DETECTED NOT DETECTED Final   Salmonella species NOT DETECTED NOT DETECTED Final   Yersinia enterocolitica NOT DETECTED NOT DETECTED Final   Vibrio species NOT DETECTED NOT DETECTED Final   Vibrio cholerae NOT DETECTED NOT DETECTED Final   Enteroaggregative E coli (EAEC) NOT DETECTED NOT DETECTED Final   Enteropathogenic E coli (EPEC) NOT DETECTED NOT DETECTED Final   Enterotoxigenic E coli (ETEC) NOT DETECTED NOT DETECTED Final   Shiga like toxin producing E coli (STEC) NOT DETECTED NOT DETECTED Final   Shigella/Enteroinvasive E coli (EIEC) NOT DETECTED NOT DETECTED Final   Cryptosporidium NOT DETECTED NOT DETECTED Final   Cyclospora cayetanensis NOT DETECTED NOT DETECTED Final   Entamoeba histolytica NOT DETECTED NOT DETECTED Final   Giardia lamblia NOT DETECTED NOT DETECTED Final   Adenovirus F40/41 NOT DETECTED NOT DETECTED Final   Astrovirus NOT DETECTED NOT DETECTED Final   Norovirus GI/GII NOT DETECTED NOT DETECTED Final   Rotavirus A NOT DETECTED NOT DETECTED Final   Sapovirus (I, II, IV, and V) NOT DETECTED NOT DETECTED Final    Comment: Performed at [facility], [address] [place], [place] [address]  C Difficile Quick Screen w  PCR reflex     Status: None   Collection Time: 10/18/22  9:38 AM   Specimen: Stool  Result Value Ref Range Status   C Diff antigen NEGATIVE NEGATIVE Final   C Diff toxin NEGATIVE NEGATIVE Final   C Diff interpretation No C. difficile detected.  Final    Comment: Performed at [facility], [address], [place], [place] [address]  MRSA Next Gen by PCR, Nasal     Status: None   Collection Time: 10/22/22 10:26 PM   Specimen: Nasal Mucosa; Nasal Swab  Result Value Ref Range Status   MRSA by PCR Next Gen NOT DETECTED NOT DETECTED Final    Comment: (NOTE) The GeneXpert MRSA Assay (FDA approved for NASAL specimens only), is one component of a comprehensive MRSA colonization surveillance program. It is not intended to diagnose MRSA infection nor to guide or monitor treatment for MRSA infections. Test performance is not FDA approved in patients less than 78 years old. Performed at [facility], [address], [place], [place] [address]     Time coordinating discharge: 45 minutes  Signed: Melina Schools Jamian Andujo  [facility] 10/27/2022, 2:48 PM

## 2022-10-27 NOTE — Telephone Encounter (Signed)
Pharmacy Patient Advocate Encounter   Received notification that prior authorization for Dexcom G7 Sensor is required/requested.   Insurance verification completed.   The patient is insured through [facility] .   Per test claim: PA submitted to [facility] via CoverMyMeds Key/confirmation #/[id number] Status is pending

## 2022-10-27 NOTE — Progress Notes (Addendum)
Daily Progress Note  DOA: 10/22/2022 Hospital Day: 6 Chief Complaint: suspected Crohn's disease    Assessment and Plan:    Brief Narrative:  Richard Carroll is a 52 y.o. year old male with a history of  diabetes, diverticulosis, colon polyps and FMH of CRC. In 2022 he had active inflammatory changes in colon on colonoscopy , no chronic changes and inflammatory markers were normal. Based on lack of symptoms and normal inflammatory markers, therapy not initiated. Currently admitted with bloody diarrhea and colitis on CT scan. Elevated CRP. C-diff negative,   Bloody diarrhea / colitis on CT scan and findings suspicious for severe Crohn's colitis on colonoscopy 10/25/22  --Continue Mesalamine 2.4 grams BID --Quantiferon gold is negative --Hepatitis B serology negative --Getting solumedrol 40 mg daily. Given DM the goal is to get him off steroids as soon as possible. Plan is to start Rinvoq if path positive for Crohn's. Our office is working on drug prior authorization. --Path still pending  --Feeling better. Tolerating soft diet. Diarrhea improving. Ok to discharge home from GI standpoint. Will keep on prednisone 40 mg until starts Rinvoq and then will taper off. I discussed with TRH who will address immediate outpatient management of blood sugars (while on steroids) pending patient getting in to see PCP --GI recommends patient not return to work for one week.  --Continue soft diet --Office unable to make him a hospital follow up at this moment but will call him at home to arrange.   Leukocytosis WBC stable at 13.5   Attending Physician Note   I have taken an interval history, reviewed the chart and examined the patient. I performed a substantive portion of this encounter, including complete performance of at least one of the key components, in conjunction with the APP. I agree with the APP's note, impression and recommendations with my edits. My additional impressions and  recommendations are as follows.   Severe colitis c/w Crohn's colitis - diarrhea and rectal bleeding have significantly improved on mesalamine and SoluMedrol. Continue soft diet. Pathology is pending. Quant gold negative.  *Continue mesalamine 2.4g bid  *Change to Prednisone 40 mg every day. Further adjustment of prednisone per Dr. Barron Alvine as outpatient.  *Considering Rinvoq, Remicade or other IBD therapy pending path review and availability on his insurance plan.  *OK for discharge today from GI standpoint.    DKA resolved, DM2 mgmt complicated by Solumedrol, prednisone - per primary service   Hypokalemia - per primary service  Claudette Head, MD [facility] See [facility], [facility], for our on call provider    Subjective / New Events:  No abdominal pain. Stools still loose but much less frequent now. Also less blood in stool. Tolerating soft diet   Objective:   Recent Labs    10/25/22 0300 10/26/22 1112  WBC 11.2* 13.5*  HGB 13.2 12.7*  HCT 39.1 37.3*  PLT 360 364   BMET Recent Labs    10/25/22 0300 10/26/22 1112 10/27/22 0705  NA 136 137 139  K 3.2* 3.2* 3.2*  CL 106 104 103  CO2 21* 26 24  GLUCOSE 174* 216* 83  BUN 10 9 9   CREATININE 0.63 0.69 0.64  CALCIUM 7.6* 7.5* 7.8*   LFT Recent Labs    10/25/22 0300  PROT 5.3*  ALBUMIN 2.1*  AST 19  ALT 12  ALKPHOS 52  BILITOT 0.6    Imaging:  CT ABDOMEN PELVIS W CONTRAST CLINICAL DATA:  Acute nonlocalized abdominal pain. Diarrhea with rectal bleeding  for 3 weeks. Nausea, fatigue, weakness, and abdominal pain. Recent weight loss.  EXAM: CT ABDOMEN AND PELVIS WITH CONTRAST  TECHNIQUE: Multidetector CT imaging of the abdomen and pelvis was performed using the standard protocol following bolus administration of intravenous contrast.  RADIATION DOSE REDUCTION: This exam was performed according to the departmental dose-optimization program which includes automated exposure control, adjustment of the mA and/or kV  according to patient size and/or use of iterative reconstruction technique.  CONTRAST:  OMNIPAQUE IOHEXOL 300 MG/ML  SOLN  COMPARISON:  10/19/2022 and 12/15/2015  FINDINGS: Lower chest: Lung bases are clear.  Hepatobiliary: Mild diffuse fatty infiltration of the liver. No focal lesions. Gallbladder and bile ducts are unremarkable.  Pancreas: Unremarkable. No pancreatic ductal dilatation or surrounding inflammatory changes.  Spleen: Normal in size without focal abnormality.  Adrenals/Urinary Tract: No adrenal gland nodules. Kidneys are symmetrical. Nephrograms are homogeneous. No hydronephrosis or hydroureter. Bladder is normal.  Stomach/Bowel: Stomach and small bowel are mostly decompressed. As seen previously, there is diffuse wall thickening throughout the colon beginning at the transverse colon and extending through the descending colon, sigmoid colon, and rectum. Changes are most likely represent infectious or inflammatory colitis. Consider Crohn's disease or pseudomembranous colitis versus infectious colitis. Scattered stool in the colon. Appendix is normal.  Vascular/Lymphatic: Mild aortic atherosclerosis. No enlarged abdominal or pelvic lymph nodes.  Reproductive: Prostate is unremarkable.  Other: Minimal residual fluid in the pelvis, resolving since prior study. No free air.  Musculoskeletal: Degenerative changes in the spine. No acute bony abnormalities.  IMPRESSION: 1. Diffuse colonic wall thickening/edema involving the transverse, descending, and rectosigmoid colon, similar to prior study. Changes likely to represent infectious or inflammatory colitis. 2. No evidence of bowel obstruction. 3. Fatty infiltration of the liver. 4. Mild aortic atherosclerosis.  Electronically Signed   By: Burman Nieves M.D.   On: 10/22/2022 20:15    Scheduled inpatient medications:   Chlorhexidine Gluconate Cloth  6 each Topical Daily   famotidine  20 mg Oral BID    insulin aspart  0-20 Units Subcutaneous TID WC   insulin aspart  0-5 Units Subcutaneous QHS   insulin glargine-yfgn  10 Units Subcutaneous BID   mesalamine  2.4 g Oral BID WC   methylPREDNISolone (SOLU-MEDROL) injection  40 mg Intravenous Daily   Continuous inpatient infusions:  PRN inpatient medications: acetaminophen, dextrose, morphine injection, ondansetron (ZOFRAN) IV, mouth rinse  Vital signs in last 24 hours: Temp:  [97.6 F (36.4 C)-98.8 F (37.1 C)] 97.9 F (36.6 C) (07/24 0800) Pulse Rate:  [62-90] 74 (07/24 0800) Resp:  [10-22] 21 (07/24 0800) BP: (116-157)/(61-93) 138/79 (07/24 0800) SpO2:  [94 %-98 %] 97 % (07/24 0800) Last BM Date : 10/27/22  Intake/Output Summary (Last 24 hours) at 10/27/2022 1140 Last data filed at 10/27/2022 0800 Gross per 24 hour  Intake 360 ml  Output --  Net 360 ml    Intake/Output from previous day: No intake/output data recorded. Intake/Output this shift: Total I/O In: 360 [P.O.:360] Out: -    Physical Exam:  General: Alert male in NAD Heart:  Regular rate and rhythm.  Pulmonary: Normal respiratory effort Abdomen: Soft, nondistended, nontender. Normal bowel sounds. Extremities: No lower extremity edema  Neurologic: Alert and oriented Psych: Pleasant. Cooperative. Insight appears normal.     LOS: 5 days   Willette Cluster ,NP 10/27/2022, 11:40 AM

## 2022-10-28 ENCOUNTER — Encounter: Payer: Self-pay | Admitting: *Deleted

## 2022-10-28 ENCOUNTER — Telehealth: Payer: Self-pay | Admitting: *Deleted

## 2022-10-28 NOTE — Transitions of Care (Post Inpatient/ED Visit) (Signed)
10/28/2022  Name: Richard Carroll MRN: [id number] DOB: Nov 18, 1970  Today's TOC FU Call Status: Today's TOC FU Call Status:: Successful TOC FU Call Competed TOC FU Call Complete Date: 10/28/22  Transition Care Management Follow-up Telephone Call Date of Discharge: 10/27/22 Discharge Facility: [facility]) Type of Discharge: Inpatient Admission Primary Inpatient Discharge Diagnosis:: DKA; rectal bleeding associated with Crohns disease How have you been since you were released from the hospital?: Better ("I am doing okay.  I am going to pick up the long acting insulin today-- my pharmacy didn't have it, and it is just now available from another pharmacy.  My sugars have been running between 200-400 since I got home yesterday.  I am new to insulin") Any questions or concerns?: No  Items Reviewed: Did you receive and understand the discharge instructions provided?: Yes (thoroughly reviewed with patient who verbalizes good understanding of same) Medications obtained,verified, and reconciled?: Yes (Medications Reviewed) (Full medication reconciliation/ review completed; no concerns or discrepancies identified; confirmed patient obtained/ is taking all newly Rx'd medications as instructed; self-manages medications and denies questions/ concerns around medications today) Any new allergies since your discharge?: No Dietary orders reviewed?: Yes Type of Diet Ordered:: Diabetic; low sugar- carb modified Do you have support at home?: Yes People in Home: child(ren), adult, spouse Name of Support/Comfort Primary Source: Reports independent in self-care activities; resides with supportive spouse/ daughter who assists as/ if needed/ indicated  Medications Reviewed Today: Medications Reviewed Today     Reviewed by Michaela Corner, RN (Registered Nurse) on 10/28/22 at 1617  Med List Status: <None>   Medication Order Taking? Sig Documenting Provider Last Dose Status Informant  Blood Glucose  Monitoring Suppl DEVI [id number] Yes 1 each by Does not apply route 3 (three) times daily. May dispense any manufacturer covered by patient's insurance. Lorin Glass, MD Taking Active   Continuous Glucose Sensor (DEXCOM G7 SENSOR) MISC [id number] Yes Dexcom glucose monitoring sensor Dahal, Melina Schools, MD Taking Active   glucose blood (CONTOUR TEST) test strip [id number] Yes Use as instructed Dahal, Melina Schools, MD Taking Active   insulin aspart (NOVOLOG) 100 UNIT/ML FlexPen [id number] Yes Inject 0-6 Units into the skin 3 (three) times daily with meals. Check Blood Glucose (BG) and inject per scale: BG <150= 0 unit; BG 150-200= 1 unit; BG 201-250= 2 unit; BG 251-300= 3 unit; BG 301-350= 4 unit; BG 351-400= 5 unit; BG >400= 6 unit and Call Primary Care. Lorin Glass, MD Taking Active            Med Note Jonnie Kind Oct 28, 2022  3:48 PM) 10/28/22: Reports during [facility] call he is taking as prescribed post-hospital discharge on 10/27/23     Insulin Glargine (BASAGLAR KWIKPEN) 100 UNIT/ML [id number] Yes Inject 8 Units into the skin 2 (two) times daily. May substitute as needed per insurance. Lorin Glass, MD Taking Active            Med Note Jonnie Kind Oct 28, 2022  3:48 PM) 10/28/22: Reports during TOC call that he is going to obtain this medication today-- was not available to pick up from outpatient pharmacy until today   Insulin Pen Needle (PEN NEEDLES) 31G X 5 MM MISC [id number] Yes 1 each by Does not apply route 3 (three) times daily. May dispense any manufacturer covered by patient's insurance. Lorin Glass, MD Taking Active   Lancet Device MISC [id number] Yes 1 each by Does not apply route 3 (three) times  daily. May dispense any manufacturer covered by patient's insurance. Lorin Glass, MD Taking Active   Lancets MISC [id number] Yes 1 each by Does not apply route 3 (three) times daily. Use as directed to check blood sugar. May dispense any manufacturer covered by patient's insurance and fits  patient's device. Lorin Glass, MD Taking Active   mesalamine (LIALDA) 1.2 g EC tablet [id number] Yes Take 2 tablets (2.4 g total) by mouth 2 (two) times daily with a meal. Dahal, Melina Schools, MD Taking Active   Multiple Vitamin (ONE-A-DAY MENS PO) [id number] Yes Take 1 tablet by mouth daily. [provider] Taking Active Self, Pharmacy Records  predniSONE (DELTASONE) 20 MG tablet [id number] Yes Take 2 tablets (40 mg total) by mouth daily. Lorin Glass, MD Taking Active   sildenafil (VIAGRA) 100 MG tablet [id number] Yes Take 0.5-1 tablets (50-100 mg total) by mouth daily as needed for erectile dysfunction. Kristian Covey, MD Taking Active Self, Pharmacy Records           Med Note Jonnie Kind Oct 28, 2022  4:00 PM) 10/28/22: Reports during [facility] call has not use din a long time  simvastatin (ZOCOR) 80 MG tablet [id number] Yes TAKE 1/2 TABLET BY MOUTH EVERY DAY Burchette, Elberta Fortis, MD Taking Active Self, Pharmacy Records           Home Care and Equipment/Supplies: Were Home Health Services Ordered?: No Any new equipment or medical supplies ordered?: No  Functional Questionnaire: Do you need assistance with bathing/showering or dressing?: No Do you need assistance with meal preparation?: No (spouse assists) Do you need assistance with eating?: No Do you have difficulty maintaining continence: No Do you need assistance with getting out of bed/getting out of a chair/moving?: No Do you have difficulty managing or taking your medications?: Yes (spouse assists with medications)  Follow up appointments reviewed: PCP Follow-up appointment confirmed?: Yes (care coordination outreach in real-time with scheduling care guide to successfully schedule hospital follow up PCP appointment 11/02/22) Date of PCP follow-up appointment?: 11/02/22 Follow-up Provider: PCP Specialist Hospital Follow-up appointment confirmed?: Yes Date of Specialist follow-up appointment?: 02/02/23 (verified this is  recommended time frame for follow up per hospital discharging provider notes) Follow-Up Specialty Provider:: GI provider Do you need transportation to your follow-up appointment?: No Do you understand care options if your condition(s) worsen?: Yes-patient verbalized understanding  SDOH Interventions Today    Flowsheet Row Most Recent Value  SDOH Interventions   Food Insecurity Interventions Intervention Not Indicated  Transportation Interventions Intervention Not Indicated  [normally drives self,  spouse or daughter assists as needed]      TOC Interventions Today    Flowsheet Row Most Recent Value  TOC Interventions   TOC Interventions Discussed/Reviewed TOC Interventions Discussed, Arranged PCP follow up within 7 days/Care Guide scheduled  [offered my direct contact information should questions/ concerns/ needs arise post-TOC call, prior to RN CM telephone visit 11/11/22- patient declined taking my number]      Interventions Today    Flowsheet Row Most Recent Value  Chronic Disease   Chronic disease during today's visit Diabetes, Other  [severe crohns disease- recent colonoscopy]  General Interventions   General Interventions Discussed/Reviewed General Interventions Discussed, Doctor Visits, Referral to Nurse, Communication with  Doctor Visits Discussed/Reviewed Doctor Visits Discussed, PCP, Specialist  PCP/Specialist Visits Compliance with follow-up visit  Communication with RN, PCP/Specialists  Education Interventions   Education Provided Provided Education  Provided Verbal Education On Nutrition, Other, Medication  [signs/ symptoms low blood  sugar along with corresponding action plan,  difference between long and short acting insulin,  basics of carb modified diet,  value of considering referral to DEC- patient declined referral today]  Nutrition Interventions   Nutrition Discussed/Reviewed Nutrition Discussed  Pharmacy Interventions   Pharmacy Dicussed/Reviewed Pharmacy  Topics Discussed  Safety Interventions   Safety Discussed/Reviewed Safety Discussed  [dangers of/ signs/ symptoms low blood sugar]      Caryl Pina, RN, BSN, CCRN Alumnus RN CM Care Coordination/ Transition of Care- [facility] Care Management [phone]: direct office

## 2022-11-01 ENCOUNTER — Other Ambulatory Visit (HOSPITAL_COMMUNITY): Payer: Self-pay

## 2022-11-01 NOTE — Telephone Encounter (Signed)
Pharmacy Patient Advocate Encounter  Received notification from [facility] that Prior Authorization for Rinvoq has been DENIED. Please advise how you'd like to proceed. Full denial letter will be uploaded to the media tab. See denial reason below.  Denied because plan requires the patient have had an inadequate response or intolerance to ONE or more TNF inhibitors (for example, adalimumab, certolizumab pegol)  PA #/Case ID/Reference #: [id number]

## 2022-11-01 NOTE — Telephone Encounter (Signed)
Any update on this request?

## 2022-11-02 ENCOUNTER — Ambulatory Visit (INDEPENDENT_AMBULATORY_CARE_PROVIDER_SITE_OTHER): Payer: BC Managed Care – PPO | Admitting: Family Medicine

## 2022-11-02 VITALS — BP 124/62 | HR 88 | Temp 98.6°F | Ht 70.0 in | Wt 187.6 lb

## 2022-11-02 DIAGNOSIS — K50111 Crohn's disease of large intestine with rectal bleeding: Secondary | ICD-10-CM

## 2022-11-02 DIAGNOSIS — E876 Hypokalemia: Secondary | ICD-10-CM | POA: Diagnosis not present

## 2022-11-02 DIAGNOSIS — D649 Anemia, unspecified: Secondary | ICD-10-CM | POA: Diagnosis not present

## 2022-11-02 DIAGNOSIS — E1110 Type 2 diabetes mellitus with ketoacidosis without coma: Secondary | ICD-10-CM | POA: Diagnosis not present

## 2022-11-02 DIAGNOSIS — Z794 Long term (current) use of insulin: Secondary | ICD-10-CM

## 2022-11-02 DIAGNOSIS — Z7984 Long term (current) use of oral hypoglycemic drugs: Secondary | ICD-10-CM

## 2022-11-02 LAB — CBC WITH DIFFERENTIAL/PLATELET
Basophils Absolute: 0 10*3/uL (ref 0.0–0.1)
Basophils Relative: 0.3 % (ref 0.0–3.0)
Eosinophils Absolute: 0.3 10*3/uL (ref 0.0–0.7)
Eosinophils Relative: 2.4 % (ref 0.0–5.0)
HCT: 37.2 % — ABNORMAL LOW (ref 39.0–52.0)
Hemoglobin: 12.2 g/dL — ABNORMAL LOW (ref 13.0–17.0)
Lymphocytes Relative: 15.2 % (ref 12.0–46.0)
Lymphs Abs: 1.7 10*3/uL (ref 0.7–4.0)
MCHC: 32.7 g/dL (ref 30.0–36.0)
MCV: 93.5 fl (ref 78.0–100.0)
Monocytes Absolute: 1.1 10*3/uL — ABNORMAL HIGH (ref 0.1–1.0)
Monocytes Relative: 10.5 % (ref 3.0–12.0)
Neutro Abs: 7.8 10*3/uL — ABNORMAL HIGH (ref 1.4–7.7)
Neutrophils Relative %: 71.6 % (ref 43.0–77.0)
Platelets: 429 10*3/uL — ABNORMAL HIGH (ref 150.0–400.0)
RBC: 3.97 Mil/uL — ABNORMAL LOW (ref 4.22–5.81)
RDW: 13.4 % (ref 11.5–15.5)
WBC: 10.9 10*3/uL — ABNORMAL HIGH (ref 4.0–10.5)

## 2022-11-02 LAB — BASIC METABOLIC PANEL
BUN: 13 mg/dL (ref 6–23)
CO2: 31 mEq/L (ref 19–32)
Calcium: 8 mg/dL — ABNORMAL LOW (ref 8.4–10.5)
Chloride: 97 mEq/L (ref 96–112)
Creatinine, Ser: 0.72 mg/dL (ref 0.40–1.50)
GFR: 106.39 mL/min (ref >60.00)
Glucose, Bld: 314 mg/dL — ABNORMAL HIGH (ref 70–99)
Potassium: 3.5 mEq/L (ref 3.5–5.1)
Sodium: 135 mEq/L (ref 135–145)

## 2022-11-02 NOTE — Patient Instructions (Signed)
Go ahead and increase the Lantus to 10 units twice daily  Let me know if having frequent sugars over 300.

## 2022-11-02 NOTE — Progress Notes (Unsigned)
Established Patient Office Visit  Subjective   Patient ID: Richard Carroll, male    DOB: [birth year]  Age: 52 y.o. MRN: [id number]  Chief Complaint  Patient presents with   Hospitalization Follow-up    HPI  {History (Optional):23778} Richard Carroll is seen for hospital follow-up.  He has chronic problems including type 2 diabetes, obesity, hyperlipidemia, and recently diagnosed Crohn's disease.  He was seen here on 19th of this month with persistent bloody diarrhea.  This was initially felt to be infectious colitis and he continued to decline after being placed on metronidazole and Cipro after ER visit.  CT scans had shown nonspecific colitis changes.  He was extremely weak at the time of his visit here on the 19th and we referred him straight to the ER for further evaluation and admission.  On evaluation in the ER on the 19th he was noted to be in DKA with pH of 7.19 with bicarb 13 and glucose over 300.  GI was consulted and patient was started on IV steroids and he was covered with insulin.  Underwent colonoscopy on the 22nd with findings indicating severe Crohn's colitis.  Started on mesalamine 2.4 g twice daily and was initially given Solu-Medrol 40 mg IV daily.  His diarrhea did improve.  Plan was to pursue Rinvoq.    Regarding his diabetes he was discontinued off of several medications including Amaryl, metformin, Jardiance, and Trulicity.  He had actually been out of Trulicity for some time secondary to availability issues.  Was started on Lantus 8 units twice daily and sliding scale insulin.  He has Dexcom 7 m and has had multiple daily readings over 300.  Occasional readings under 200.  No hypoglycemic episodes.  Appetite still poor.  Denies any abdominal pain at this time.  Recent A1c 12.1%.  Recent hemoglobin 12.7.  He had some hypokalemia with discharge potassium 3.2.  Past Medical History:  Diagnosis Date   Arthritis    Crohn's colitis, with rectal bleeding (HCC) 10/22/2022    Diabetes mellitus type II    DIABETES MELLITUS, TYPE II 08/18/2009   on meds   Hay fever    with allergies   Hyperlipidemia    HYPERLIPIDEMIA 08/18/2009   on meds   Seasonal allergies    Past Surgical History:  Procedure Laterality Date   BIOPSY  10/25/2022   Procedure: BIOPSY;  Surgeon: Meryl Dare, MD;  Location: [facility];  Service: Gastroenterology;;   COLONOSCOPY  01/2021   COLONOSCOPY WITH PROPOFOL N/A 10/25/2022   Procedure: COLONOSCOPY WITH PROPOFOL;  Surgeon: Meryl Dare, MD;  Location: [facility];  Service: Gastroenterology;  Laterality: N/A;   FINGER SURGERY Right    ring finger   KNEE SURGERY Right 2005    reports that he has never smoked. His smokeless tobacco use includes chew. He reports that he does not currently use alcohol. He reports that he does not use drugs. family history includes Alcohol abuse in his maternal grandfather; Colon cancer in his paternal uncle; Colon cancer (age of onset: 61) in his father; Diabetes in his maternal grandmother and mother; Heart disease in his father; Hyperlipidemia in his mother; Hypertension in his mother; Prostate cancer in his paternal uncle; Prostate cancer (age of onset: 55) in his father; Stroke in his mother. No Known Allergies  Review of Systems  Constitutional:  Negative for chills and fever.  Respiratory:  Negative for cough.   Cardiovascular:  Negative for chest pain.  Gastrointestinal:  Negative for abdominal pain,  constipation, nausea and vomiting.      Objective:     BP 124/62 (BP Location: Left Arm, Patient Position: Sitting, Cuff Size: Normal)   Pulse 88   Temp 98.6 F (37 C) (Oral)   Ht 5\' 10"  (1.778 m)   Wt 187 lb 9.6 oz (85.1 kg)   SpO2 98%   BMI 26.92 kg/m  BP Readings from Last 3 Encounters:  11/02/22 124/62  10/27/22 (!) 125/94  10/22/22 118/86   Wt Readings from Last 3 Encounters:  11/02/22 187 lb 9.6 oz (85.1 kg)  10/22/22 183 lb 6.8 oz (83.2 kg)  10/22/22 180 lb 4.8 oz  (81.8 kg)      Physical Exam Vitals reviewed.  Constitutional:      Appearance: Normal appearance.  Cardiovascular:     Rate and Rhythm: Normal rate and regular rhythm.  Pulmonary:     Effort: Pulmonary effort is normal.     Breath sounds: Normal breath sounds. No wheezing or rales.  Abdominal:     General: There is no distension.     Palpations: Abdomen is soft.     Tenderness: There is no abdominal tenderness.  Musculoskeletal:     Right lower leg: No edema.     Left lower leg: No edema.  Neurological:     Mental Status: He is alert.      Results for orders placed or performed in visit on 11/02/22  CBC with Differential/Platelet  Result Value Ref Range   WBC 10.9 (H) 4.0 - 10.5 K/uL   RBC 3.97 (L) 4.22 - 5.81 Mil/uL   Hemoglobin 12.2 (L) 13.0 - 17.0 g/dL   HCT 82.9 (L) 56.2 - 13.0 %   MCV 93.5 78.0 - 100.0 fl   MCHC 32.7 30.0 - 36.0 g/dL   RDW 86.5 78.4 - 69.6 %   Platelets 429.0 (H) 150.0 - 400.0 K/uL   Neutrophils Relative % 71.6 43.0 - 77.0 %   Lymphocytes Relative 15.2 12.0 - 46.0 %   Monocytes Relative 10.5 3.0 - 12.0 %   Eosinophils Relative 2.4 0.0 - 5.0 %   Basophils Relative 0.3 0.0 - 3.0 %   Neutro Abs 7.8 (H) 1.4 - 7.7 K/uL   Lymphs Abs 1.7 0.7 - 4.0 K/uL   Monocytes Absolute 1.1 (H) 0.1 - 1.0 K/uL   Eosinophils Absolute 0.3 0.0 - 0.7 K/uL   Basophils Absolute 0.0 0.0 - 0.1 K/uL  Basic metabolic panel  Result Value Ref Range   Sodium 135 135 - 145 mEq/L   Potassium 3.5 3.5 - 5.1 mEq/L   Chloride 97 96 - 112 mEq/L   CO2 31 19 - 32 mEq/L   Glucose, Bld 314 (H) 70 - 99 mg/dL   BUN 13 6 - 23 mg/dL   Creatinine, Ser 2.95 0.40 - 1.50 mg/dL   GFR 284.13 >24.40 mL/min   Calcium 8.0 (L) 8.4 - 10.5 mg/dL    Last CBC Lab Results  Component Value Date   WBC 10.9 (H) 11/02/2022   HGB 12.2 (L) 11/02/2022   HCT 37.2 (L) 11/02/2022   MCV 93.5 11/02/2022   MCH 30.8 10/26/2022   RDW 13.4 11/02/2022   PLT 429.0 (H) 11/02/2022   Last metabolic  panel Lab Results  Component Value Date   GLUCOSE 314 (H) 11/02/2022   NA 135 11/02/2022   K 3.5 11/02/2022   CL 97 11/02/2022   CO2 31 11/02/2022   BUN 13 11/02/2022   CREATININE 0.72 11/02/2022   GFR  106.39 11/02/2022   CALCIUM 8.0 (L) 11/02/2022   PHOS 3.1 10/27/2022   PROT 5.3 (L) 10/25/2022   ALBUMIN 2.1 (L) 10/25/2022   BILITOT 0.6 10/25/2022   ALKPHOS 52 10/25/2022   AST 19 10/25/2022   ALT 12 10/25/2022   ANIONGAP 12 10/27/2022   Last lipids Lab Results  Component Value Date   CHOL 142 01/05/2022   HDL 47.70 01/05/2022   LDLCALC 73 01/05/2022   LDLDIRECT 113.0 01/13/2018   TRIG 106.0 01/05/2022   CHOLHDL 3 01/05/2022   Last hemoglobin A1c Lab Results  Component Value Date   HGBA1C 12.1 (H) 10/23/2022   Last thyroid functions No results found for: "TSH", "T3TOTAL", "T4TOTAL", "THYROIDAB"    The 10-year ASCVD risk score (Arnett DK, et al., 2019) is: 4.1%    Assessment & Plan:   #1 recent abdominal pain and bloody diarrhea.  Workup as above consistent with Crohn's colitis.  Patient currently on prednisone and mesalamine with plans to hopefully get covered on Rinvoq -Continue close follow-up with GI -Follow-up immediately for any fevers or worsening diarrhea  #2 type 2 diabetes with recent DKA.  Patient currently on Lantus 8 units twice daily and NovoLog sliding scale.  Several blood sugars over 300.  Should improve after coming off prednisone.  Go ahead and bump up Lantus to 10 units twice daily and continue NovoLog sliding scale.  Will likely be out of phase back in some of his oral medications including Jardiance soon  #3 hypokalemia on multiple hospital labs.  Recheck basic metabolic panel today.  Consider potassium replacement if still low.   Return in about 3 weeks (around 11/23/2022).    Evelena Peat, MD

## 2022-11-03 ENCOUNTER — Encounter (INDEPENDENT_AMBULATORY_CARE_PROVIDER_SITE_OTHER): Payer: Self-pay

## 2022-11-03 ENCOUNTER — Encounter: Payer: Self-pay | Admitting: Gastroenterology

## 2022-11-05 ENCOUNTER — Telehealth: Payer: Self-pay

## 2022-11-05 NOTE — Telephone Encounter (Signed)
Received a fax from [employer] patient assistance today with request for completion of Provider/prescription form. Provider portion has been completed and faxed back to [employer] as they requested - Rinvoq 45 mg daily for 8 weeks, then Rinvoq 30 mg daily for maintenance dose.   Patient notified via MyChart.

## 2022-11-08 ENCOUNTER — Encounter: Payer: Self-pay | Admitting: Family Medicine

## 2022-11-08 MED ORDER — DEXCOM G7 SENSOR MISC: 1 refills | Status: DC

## 2022-11-08 NOTE — Telephone Encounter (Signed)
I see that we have also applied for patient assistance through [employer] for the Rinvoq.  If still unable to start this medication, will instead pivot to Remicade to start at 5 mg/kg at week 0, 2, 6, then every 8 weeks.  In the meantime, continue with prednisone which was prescribed at the time of discharge.  Please try to find sooner appointment with me or Gunnar Fusi in the clinic, as he is currently scheduled for October

## 2022-11-09 ENCOUNTER — Encounter: Payer: Self-pay | Admitting: Family Medicine

## 2022-11-10 ENCOUNTER — Encounter: Payer: Self-pay | Admitting: Gastroenterology

## 2022-11-10 NOTE — Telephone Encounter (Signed)
Plan for Remicade (or biosimilar depending on insurance formulary) 5 mg/kilogram at week 0, 2, 6, then every 8 weeks. Orders placed at [facility] infusion center [facility] [address]. They will work on PA and will contact pt to set up appt once approved.

## 2022-11-10 NOTE — Telephone Encounter (Signed)
See 8/7 patient message - per Dr. Barron Alvine: Plan for Remicade (or biosimilar depending on insurance formulary) 5 mg/kilogram at week 0, 2, 6, then every 8 weeks.  Orders placed at [facility] infusion center [facility]. They will work on PA and will contact pt to set up appt once approved.

## 2022-11-10 NOTE — Telephone Encounter (Signed)
Per 8/7 patient message. Pt still has not heard anything from [employer] program regarding Rinvoq assistance.   Orders placed at [facility] infusion center [facility] street for Remicade (or biosimilar depending on insurance formulary) 5 mg/kilogram at week 0, 2, 6, then every 8 weeks.. They will work on PA and will contact pt to set up appt once approved.  Patient notified via MyChart. No sooner office visits at this time.

## 2022-11-12 ENCOUNTER — Telehealth: Payer: Self-pay | Admitting: Family Medicine

## 2022-11-12 NOTE — Telephone Encounter (Signed)
Requesting certification of healthcare provider FMLA forms previously filled out to  be faxed to [phone]

## 2022-11-15 ENCOUNTER — Ambulatory Visit: Payer: Self-pay

## 2022-11-15 DIAGNOSIS — Z0279 Encounter for issue of other medical certificate: Secondary | ICD-10-CM

## 2022-11-15 NOTE — Patient Outreach (Signed)
  Care Coordination   Initial Visit Note   11/15/2022 Name: Richard Carroll MRN: [id number] DOB: 1970-11-06  Richard Carroll is a 52 y.o. year old male who sees Burchette, Elberta Fortis, MD for primary care. I spoke with  Richard Carroll by phone today.  What matters to the patients health and wellness today?  Managing health and getting stronger.    Goals Addressed             This Visit's Progress    Maintain Health and get stronger       Patient Goals/Self Care Activities: -Patient/Caregiver will self-administer medications as prescribed as evidenced by self-report/primary caregiver report  -Patient/Caregiver will attend all scheduled provider appointments as evidenced by clinician review of documented attendance to scheduled appointments and patient/caregiver report -Patient/Caregiver will call provider office for new concerns or questions as evidenced by review of documented incoming telephone call notes and patient report  -check blood sugar at prescribed times -check blood sugar if I feel it is too high or too low -record values and write them down take them to all doctor visits    Patient doing okay still some weakness.  Blood sugar increase due to steroids due to crohns.    Using insulin as prescribed.  Last blood sugar 216.  Discussed diabetes management and diet.  He is still on FMLA.  Has support of wife and daughters.           SDOH assessments and interventions completed:  Yes  SDOH Interventions Today    Flowsheet Row Most Recent Value  SDOH Interventions   Food Insecurity Interventions Intervention Not Indicated  Housing Interventions Intervention Not Indicated        Care Coordination Interventions:  Yes, provided   Follow up plan: Follow up call scheduled for 8/27    Encounter Outcome:  Pt. Visit Completed   Richard Leriche, RN, MSN [facility] Care Management Care Management Coordinator Direct Line [phone]

## 2022-11-15 NOTE — Patient Instructions (Signed)
Visit Information  Thank you for taking time to visit with me today. Please don't hesitate to contact me if I can be of assistance to you.   Following are the goals we discussed today:   Goals Addressed             This Visit's Progress    Maintain Health and get stronger       Patient Goals/Self Care Activities: -Patient/Caregiver will self-administer medications as prescribed as evidenced by self-report/primary caregiver report  -Patient/Caregiver will attend all scheduled provider appointments as evidenced by clinician review of documented attendance to scheduled appointments and patient/caregiver report -Patient/Caregiver will call provider office for new concerns or questions as evidenced by review of documented incoming telephone call notes and patient report  -check blood sugar at prescribed times -check blood sugar if I feel it is too high or too low -record values and write them down take them to all doctor visits    Patient doing okay still some weakness.  Blood sugar increase due to steroids due to crohns.    Using insulin as prescribed.  Last blood sugar 216.  Discussed diabetes management and diet.  He is still on FMLA.  Has support of wife and daughters.           Our next appointment is by telephone on 11/30/22 at 930 am  Please call the care guide team at [phone] if you need to cancel or reschedule your appointment.   If you are experiencing a Mental Health or Behavioral Health Crisis or need someone to talk to, please call the Suicide and Crisis Lifeline: 988   Patient verbalizes understanding of instructions and care plan provided today and agrees to view in MyChart. Active MyChart status and patient understanding of how to access instructions and care plan via MyChart confirmed with patient.     The patient has been provided with contact information for the care management team and has been advised to call with any health related questions or concerns.    Bary Leriche, RN, MSN [facility] Care Management Care Management Coordinator Direct Line [phone]

## 2022-11-16 NOTE — Telephone Encounter (Signed)
I spoke with the patient and forms have been faxed per request

## 2022-11-17 NOTE — Telephone Encounter (Signed)
Hi Richard Carroll, just wanted to follow up and see if benefits investigation was started for Remicade orders that we placed 1 week ago? Thanks

## 2022-11-18 ENCOUNTER — Encounter: Payer: Self-pay | Admitting: Family Medicine

## 2022-11-19 MED ORDER — DEXCOM G7 SENSOR MISC: 3 refills | Status: DC

## 2022-11-22 ENCOUNTER — Encounter: Payer: Self-pay | Admitting: Gastroenterology

## 2022-11-23 ENCOUNTER — Ambulatory Visit (INDEPENDENT_AMBULATORY_CARE_PROVIDER_SITE_OTHER): Payer: BC Managed Care – PPO | Admitting: Family Medicine

## 2022-11-23 ENCOUNTER — Encounter: Payer: Self-pay | Admitting: Gastroenterology

## 2022-11-23 ENCOUNTER — Encounter: Payer: Self-pay | Admitting: Family Medicine

## 2022-11-23 VITALS — BP 118/70 | HR 115 | Temp 98.8°F | Ht 70.0 in | Wt 187.4 lb

## 2022-11-23 DIAGNOSIS — K50111 Crohn's disease of large intestine with rectal bleeding: Secondary | ICD-10-CM | POA: Diagnosis not present

## 2022-11-23 DIAGNOSIS — E1165 Type 2 diabetes mellitus with hyperglycemia: Secondary | ICD-10-CM | POA: Diagnosis not present

## 2022-11-23 DIAGNOSIS — Z7984 Long term (current) use of oral hypoglycemic drugs: Secondary | ICD-10-CM | POA: Diagnosis not present

## 2022-11-23 MED ORDER — PREDNISONE 10 MG PO TABS: ORAL_TABLET | ORAL | 0 refills | Status: AC

## 2022-11-23 MED ORDER — EMPAGLIFLOZIN 10 MG PO TABS: 10.0000 mg | ORAL_TABLET | Freq: Every day | ORAL | 11 refills | Status: DC

## 2022-11-23 NOTE — Progress Notes (Signed)
Established Patient Office Visit  Subjective   Patient ID: Richard Carroll, male    DOB: [birth year]  Age: 52 y.o. MRN: [id number]  Chief Complaint  Patient presents with   Medical Management of Chronic Issues    HPI   Richard Carroll is seen in follow-up from recent hospitalization and posthospitalization visit on 11/02/2022.  Generally doing better.  He states he had no diarrhea whatsoever past few days.  Remains on mesalamine and prednisone.  He is apparently still on prednisone 40 mg daily.  Blood sugars have remained high with 14-day average of 336.  We had increased his NovoLog scale to be a little more aggressive and also increased his Basaglar to 10 units twice daily.  He still remains off of metformin, glimepiride, and Jardiance.  Drinking fluids well.  Feels like he is well-hydrated.  Appetite slightly improved. Monitoring blood sugars with Dexcom  His job is fairly physical and he still feels incapable of going back to full-time yet.  He is trying to build up stamina by increasing activities day-to-day.  Does not follow-up with GI until October.  Trying to get insurance approval for Rinvoq.  Past Medical History:  Diagnosis Date   Arthritis    Crohn's colitis, with rectal bleeding (HCC) 10/22/2022   Diabetes mellitus type II    DIABETES MELLITUS, TYPE II 08/18/2009   on meds   Hay fever    with allergies   Hyperlipidemia    HYPERLIPIDEMIA 08/18/2009   on meds   Seasonal allergies    Past Surgical History:  Procedure Laterality Date   BIOPSY  10/25/2022   Procedure: BIOPSY;  Surgeon: Meryl Dare, MD;  Location: [facility];  Service: Gastroenterology;;   COLONOSCOPY  01/2021   COLONOSCOPY WITH PROPOFOL N/A 10/25/2022   Procedure: COLONOSCOPY WITH PROPOFOL;  Surgeon: Meryl Dare, MD;  Location: [facility];  Service: Gastroenterology;  Laterality: N/A;   FINGER SURGERY Right    ring finger   KNEE SURGERY Right 2005    reports that he has never smoked. His  smokeless tobacco use includes chew. He reports that he does not currently use alcohol. He reports that he does not use drugs. family history includes Alcohol abuse in his maternal grandfather; Colon cancer in his paternal uncle; Colon cancer (age of onset: 28) in his father; Diabetes in his maternal grandmother and mother; Heart disease in his father; Hyperlipidemia in his mother; Hypertension in his mother; Prostate cancer in his paternal uncle; Prostate cancer (age of onset: 69) in his father; Stroke in his mother. No Known Allergies  Review of Systems  Constitutional:  Positive for malaise/fatigue. Negative for chills and fever.  Eyes:  Negative for blurred vision.  Respiratory:  Negative for shortness of breath.   Cardiovascular:  Negative for chest pain.  Gastrointestinal:  Negative for abdominal pain, diarrhea, nausea and vomiting.  Neurological:  Negative for dizziness, weakness and headaches.      Objective:     BP 118/70 (BP Location: Left Arm, Patient Position: Sitting, Cuff Size: Normal)   Pulse (!) 115   Temp 98.8 F (37.1 C) (Oral)   Ht 5\' 10"  (1.778 m)   Wt 187 lb 6.4 oz (85 kg)   SpO2 98%   BMI 26.89 kg/m  BP Readings from Last 3 Encounters:  11/23/22 118/70  11/02/22 124/62  10/27/22 (!) 125/94   Wt Readings from Last 3 Encounters:  11/23/22 187 lb 6.4 oz (85 kg)  11/02/22 187 lb 9.6 oz (85.1  kg)  10/22/22 183 lb 6.8 oz (83.2 kg)      Physical Exam Vitals reviewed.  Constitutional:      General: He is not in acute distress.    Appearance: He is well-developed.  HENT:     Right Ear: External ear normal.     Left Ear: External ear normal.     Mouth/Throat:     Mouth: Mucous membranes are moist.     Pharynx: Oropharynx is clear.  Eyes:     Pupils: Pupils are equal, round, and reactive to light.  Neck:     Thyroid: No thyromegaly.  Cardiovascular:     Rate and Rhythm: Normal rate and regular rhythm.  Pulmonary:     Effort: Pulmonary effort is  normal. No respiratory distress.     Breath sounds: Normal breath sounds. No wheezing or rales.  Musculoskeletal:     Cervical back: Neck supple.  Neurological:     Mental Status: He is alert and oriented to person, place, and time.      No results found for any visits on 11/23/22.  Last CBC Lab Results  Component Value Date   WBC 10.9 (H) 11/02/2022   HGB 12.2 (L) 11/02/2022   HCT 37.2 (L) 11/02/2022   MCV 93.5 11/02/2022   MCH 30.8 10/26/2022   RDW 13.4 11/02/2022   PLT 429.0 (H) 11/02/2022   Last metabolic panel Lab Results  Component Value Date   GLUCOSE 314 (H) 11/02/2022   NA 135 11/02/2022   K 3.5 11/02/2022   CL 97 11/02/2022   CO2 31 11/02/2022   BUN 13 11/02/2022   CREATININE 0.72 11/02/2022   GFR 106.39 11/02/2022   CALCIUM 8.0 (L) 11/02/2022   PHOS 3.1 10/27/2022   PROT 5.3 (L) 10/25/2022   ALBUMIN 2.1 (L) 10/25/2022   BILITOT 0.6 10/25/2022   ALKPHOS 52 10/25/2022   AST 19 10/25/2022   ALT 12 10/25/2022   ANIONGAP 12 10/27/2022   Last lipids Lab Results  Component Value Date   CHOL 142 01/05/2022   HDL 47.70 01/05/2022   LDLCALC 73 01/05/2022   LDLDIRECT 113.0 01/13/2018   TRIG 106.0 01/05/2022   CHOLHDL 3 01/05/2022   Last hemoglobin A1c Lab Results  Component Value Date   HGBA1C 12.1 (H) 10/23/2022      The 10-year ASCVD risk score (Arnett DK, et al., 2019) is: 3.7%    Assessment & Plan:   #1 recent diagnosis of Crohn's colitis.  Symptomatically improved.  He is currently on mesalamine and prednisone and runs out of prednisone in about a week.  They are attempting to get him covered for Rinvoq.  We did have questions regarding abrupt discontinuation of prednisone since he has been on this for approximately 1 month.  Will check with GI  #2 type 2 diabetes with recent poor control and certainly exacerbated by prednisone.  He is aware blood sugars should improve significantly after coming off prednisone.  He is well-hydrated at this time  we will start back Jardiance 10 mg once daily.  Continue Basaglar 10 units twice daily.  Continue sliding scale NovoLog.  May eventually start back low-dose metformin but would prefer to wait until after his GI symptoms are fully stabilized -Set up 38-month follow-up to reassess A1c  We did extend his work note out for another week to help with building up further strength and stamina.  Evelena Peat, MD

## 2022-11-23 NOTE — Patient Instructions (Addendum)
Start back the Jardiance 10 mg once daily  Continue to monitor blood sugars regularly  Your blood sugars should start to decline after coming off the prednisone.  Check with GI to see if tapering of prednisone indicated (versus abrupt stopping)

## 2022-11-24 ENCOUNTER — Telehealth: Payer: Self-pay | Admitting: Pharmacy Technician

## 2022-11-24 ENCOUNTER — Encounter: Payer: Self-pay | Admitting: Gastroenterology

## 2022-11-24 NOTE — Telephone Encounter (Signed)
Patient is advised that his insurance has approved Remicade infusion and he should be getting a call very soon to get his first infusion scheduled. Advised we will cease efforts for Rinvoq patient assistance at this time since we now have okay to move forward with Remicade. Patient verbalizes understanding.

## 2022-11-24 NOTE — Telephone Encounter (Signed)
Dottie, I was given a fax from [employer] regarding Rinvoq prescription. Will get to you today since it looks like you were waiting for a response. Thanks

## 2022-11-24 NOTE — Telephone Encounter (Signed)
Auth Submission: APPROVED Site of care: Site of care: [facility] Payer:  Medication & CPT/J Code(s) submitted: Remicade (Infliximab) J1745 Route of submission (phone, fax, portal):  Phone # Fax # Auth type: Buy/Bill PB Units/visits requested: 5MG /KG 400MG  Reference number: [id number] Approval from: 11/26/22 to 05/28/23

## 2022-11-25 ENCOUNTER — Encounter: Payer: Self-pay | Admitting: Gastroenterology

## 2022-11-25 MED ORDER — MESALAMINE 1.2 G PO TBEC: 2.4000 g | DELAYED_RELEASE_TABLET | Freq: Two times a day (BID) | ORAL | 1 refills | Status: DC

## 2022-11-25 NOTE — Telephone Encounter (Signed)
Patient has met his OOP and deductible for the year.  Remicade will be covered 100%.  Patient understands he may have administration fees.   Patient has no further questions at this time.

## 2022-11-30 ENCOUNTER — Ambulatory Visit: Payer: Self-pay

## 2022-11-30 ENCOUNTER — Ambulatory Visit: Payer: BC Managed Care – PPO

## 2022-11-30 NOTE — Patient Outreach (Signed)
  Care Coordination   Follow Up Visit Note   11/30/2022 Name: Richard Carroll MRN: [id number] DOB: [birth year]  Richard Carroll is a 52 y.o. year old male who sees Burchette, Elberta Fortis, MD for primary care. I spoke with  Richard Carroll by phone today.  What matters to the patients health and wellness today?  Diabetes management    Goals Addressed             This Visit's Progress    COMPLETED: Maintain Health and get stronger       Patient Goals/Self Care Activities: -Patient/Caregiver will self-administer medications as prescribed as evidenced by self-report/primary caregiver report  -Patient/Caregiver will attend all scheduled provider appointments as evidenced by clinician review of documented attendance to scheduled appointments and patient/caregiver report -Patient/Caregiver will call provider office for new concerns or questions as evidenced by review of documented incoming telephone call notes and patient report  -check blood sugar at prescribed times -check blood sugar if I feel it is too high or too low -record values and write them down take them to all doctor visits    Spoke with patient he states he is doing okay. Set to go back to work on tomorrow.  Blood sugars still on the high side due to steroids but managing.  Sugar this am 200.  Extensive discussion on meals and meal planning. Advised on pacing self with activities.          SDOH assessments and interventions completed:  Yes     Care Coordination Interventions:  Yes, provided   Follow up plan: No further intervention required.   Encounter Outcome:  Pt. Visit Completed   Richard Carroll Idelle Jo, RN, MSN [facility]  [facility], [facility] Management Community Coordinator Direct Dial: [phone]  Fax: [phone] Website: [email or web address]

## 2022-11-30 NOTE — Patient Instructions (Signed)
Visit Information  Thank you for taking time to visit with me today. Please don't hesitate to contact me if I can be of assistance to you.   Following are the goals we discussed today:   Goals Addressed             This Visit's Progress    COMPLETED: Maintain Health and get stronger       Patient Goals/Self Care Activities: -Patient/Caregiver will self-administer medications as prescribed as evidenced by self-report/primary caregiver report  -Patient/Caregiver will attend all scheduled provider appointments as evidenced by clinician review of documented attendance to scheduled appointments and patient/caregiver report -Patient/Caregiver will call provider office for new concerns or questions as evidenced by review of documented incoming telephone call notes and patient report  -check blood sugar at prescribed times -check blood sugar if I feel it is too high or too low -record values and write them down take them to all doctor visits    Spoke with patient he states he is doing okay. Set to go back to work on tomorrow.  Blood sugars still on the high side due to steroids but managing.  Sugar this am 200.  Extensive discussion on meals and meal planning. Advised on pacing self with activities.            If you are experiencing a Mental Health or Behavioral Health Crisis or need someone to talk to, please call the Suicide and Crisis Lifeline: 988   Patient verbalizes understanding of instructions and care plan provided today and agrees to view in MyChart. Active MyChart status and patient understanding of how to access instructions and care plan via MyChart confirmed with patient.     The patient has been provided with contact information for the care management team and has been advised to call with any health related questions or concerns.   Bary Leriche, RN, MSN [facility], [facility] Management Community Coordinator Direct Dial:  [phone]  Fax: [phone] Website: [email or web address]

## 2022-12-01 ENCOUNTER — Encounter: Payer: Self-pay | Admitting: Family Medicine

## 2022-12-02 MED ORDER — DOXYCYCLINE HYCLATE 100 MG PO CAPS: 100.0000 mg | ORAL_CAPSULE | Freq: Two times a day (BID) | ORAL | 0 refills | Status: AC

## 2022-12-02 NOTE — Telephone Encounter (Signed)
Rx sent and appt scheduled for tomorrow

## 2022-12-03 ENCOUNTER — Encounter: Payer: Self-pay | Admitting: Family Medicine

## 2022-12-03 ENCOUNTER — Ambulatory Visit (INDEPENDENT_AMBULATORY_CARE_PROVIDER_SITE_OTHER): Payer: BC Managed Care – PPO | Admitting: Family Medicine

## 2022-12-03 VITALS — BP 122/76 | HR 100 | Temp 98.1°F | Ht 70.0 in | Wt 194.7 lb

## 2022-12-03 DIAGNOSIS — Z794 Long term (current) use of insulin: Secondary | ICD-10-CM

## 2022-12-03 DIAGNOSIS — E1165 Type 2 diabetes mellitus with hyperglycemia: Secondary | ICD-10-CM | POA: Diagnosis not present

## 2022-12-03 DIAGNOSIS — L0291 Cutaneous abscess, unspecified: Secondary | ICD-10-CM

## 2022-12-03 DIAGNOSIS — Z7984 Long term (current) use of oral hypoglycemic drugs: Secondary | ICD-10-CM

## 2022-12-03 NOTE — Progress Notes (Signed)
Established Patient Office Visit  Subjective   Patient ID: Richard Carroll, male    DOB: 03/03/1971  Age: 52 y.o. MRN: [id number]  Chief Complaint  Patient presents with   Abcess    HPI   Richard Carroll is seen with concern for skin lesion that came up suprapubic area near the midline few days ago.  He states it came up fairly rapidly.  He sent a picture couple days ago and we recommended starting doxycycline and set up follow-up.  Denies any known history of MRSA.  He does have type 2 diabetes with recent poor control and also currently on prednisone.  Current prednisone dose down to 25 mg. He has been applying warm compresses to the area which is sore to touch.  He has had a little bit of drainage past day or so.  As per previous note, recent hospitalization and diagnosed with Crohn's disease.  Was approved for Remicade injections.  Has not yet started those.  He states his GI symptoms are currently stable.  His blood sugars have improved some since reducing prednisone dose.  He has continuous glucose monitor.  He is currently covered with Basaglar and sliding scale NovoLog and also takes Jardiance.  Starting to see some low 100 fasting sugars.  They tend to be more later in the day.  His A1c last January was 7.1 and 12.1 in July.  Past Medical History:  Diagnosis Date   Arthritis    Crohn's colitis, with rectal bleeding (HCC) 10/22/2022   Diabetes mellitus type II    DIABETES MELLITUS, TYPE II 08/18/2009   on meds   Hay fever    with allergies   Hyperlipidemia    HYPERLIPIDEMIA 08/18/2009   on meds   Seasonal allergies    Past Surgical History:  Procedure Laterality Date   BIOPSY  10/25/2022   Procedure: BIOPSY;  Surgeon: Meryl Dare, MD;  Location: [facility];  Service: Gastroenterology;;   COLONOSCOPY  01/2021   COLONOSCOPY WITH PROPOFOL N/A 10/25/2022   Procedure: COLONOSCOPY WITH PROPOFOL;  Surgeon: Meryl Dare, MD;  Location: [facility];  Service:  Gastroenterology;  Laterality: N/A;   FINGER SURGERY Right    ring finger   KNEE SURGERY Right 2005    reports that he has never smoked. His smokeless tobacco use includes chew. He reports that he does not currently use alcohol. He reports that he does not use drugs. family history includes Alcohol abuse in his maternal grandfather; Colon cancer in his paternal uncle; Colon cancer (age of onset: 60) in his father; Diabetes in his maternal grandmother and mother; Heart disease in his father; Hyperlipidemia in his mother; Hypertension in his mother; Prostate cancer in his paternal uncle; Prostate cancer (age of onset: 25) in his father; Stroke in his mother. No Known Allergies  Review of Systems  Constitutional:  Negative for chills and fever.  Gastrointestinal:  Negative for nausea and vomiting.      Objective:     BP 122/76 (BP Location: Left Arm, Patient Position: Sitting, Cuff Size: Normal)   Pulse 100   Temp 98.1 F (36.7 C) (Oral)   Ht 5\' 10"  (1.778 m)   Wt 194 lb 11.2 oz (88.3 kg)   SpO2 98%   BMI 27.94 kg/m  BP Readings from Last 3 Encounters:  12/03/22 122/76  11/23/22 118/70  11/02/22 124/62   Wt Readings from Last 3 Encounters:  12/03/22 194 lb 11.2 oz (88.3 kg)  11/23/22 187 lb 6.4 oz (  85 kg)  11/02/22 187 lb 9.6 oz (85.1 kg)      Physical Exam Vitals reviewed.  Constitutional:      Appearance: Normal appearance.  Cardiovascular:     Rate and Rhythm: Normal rate and regular rhythm.  Skin:    Comments: He has approximately 1-1/2 and 1-1/2 cm area of erythema and mild swelling suprapubic area near the midline.  Very mild surrounding cellulitis changes.  He has little bit of purulence draining from the center of this region.  No significant fluctuance.  Fair amount of induration.  Neurological:     Mental Status: He is alert.      No results found for any visits on 12/03/22.    The 10-year ASCVD risk score (Arnett DK, et al., 2019) is: 3.9%     Assessment & Plan:   #1 abscess and mild cellulitis suprapubic area.  This is already spontaneously draining and has no significant fluctuance.  -Continue with warm compresses -Continue to clean daily with soap and water -Continue doxycycline 100 mg twice daily -Wound culture obtained -Follow-up immediately for any fever, progressive swelling, erythema, or other concerns  #2 type 2 diabetes with recent poor control and recent episode of DKA in the setting of GI issues with Crohn's disease.  He is on tapering dose of prednisone 25 mg daily and blood sugars are starting to improve.  He remains on Jardiance as well as Hospital doctor and NovoLog sliding scale 3 times daily.  He has done well in the past on GLP-1 medications but had some problems with availability. -Continue close monitoring -He has scheduled follow-up for diabetes send couple months and will reassess A1c then. No follow-ups on file.    Evelena Peat, MD

## 2022-12-03 NOTE — Patient Instructions (Signed)
Keep area clean with soap and water  Consider soaking some in Epsom salts.

## 2022-12-06 LAB — WOUND CULTURE
MICRO NUMBER:: 15405815
SPECIMEN QUALITY:: ADEQUATE

## 2022-12-08 ENCOUNTER — Other Ambulatory Visit: Payer: BC Managed Care – PPO

## 2022-12-12 ENCOUNTER — Other Ambulatory Visit: Payer: Self-pay

## 2022-12-12 ENCOUNTER — Emergency Department (HOSPITAL_COMMUNITY)
Admission: EM | Admit: 2022-12-12 | Discharge: 2022-12-12 | Disposition: A | Discharge status: Home / Self Care | Payer: BC Managed Care – PPO | Attending: Emergency Medicine | Admitting: Emergency Medicine

## 2022-12-12 ENCOUNTER — Encounter (HOSPITAL_COMMUNITY): Payer: Self-pay | Admitting: Emergency Medicine

## 2022-12-12 DIAGNOSIS — Z794 Long term (current) use of insulin: Secondary | ICD-10-CM | POA: Diagnosis not present

## 2022-12-12 DIAGNOSIS — M7989 Other specified soft tissue disorders: Secondary | ICD-10-CM | POA: Diagnosis not present

## 2022-12-12 DIAGNOSIS — R600 Localized edema: Secondary | ICD-10-CM | POA: Insufficient documentation

## 2022-12-12 DIAGNOSIS — E119 Type 2 diabetes mellitus without complications: Secondary | ICD-10-CM | POA: Insufficient documentation

## 2022-12-12 DIAGNOSIS — R2240 Localized swelling, mass and lump, unspecified lower limb: Secondary | ICD-10-CM | POA: Diagnosis not present

## 2022-12-12 LAB — CBC WITH DIFFERENTIAL/PLATELET
Abs Immature Granulocytes: 0.04 10*3/uL (ref 0.00–0.07)
Basophils Absolute: 0 10*3/uL (ref 0.0–0.1)
Basophils Relative: 0 %
Eosinophils Absolute: 0.1 10*3/uL (ref 0.0–0.5)
Eosinophils Relative: 1 %
HCT: 38.4 % — ABNORMAL LOW (ref 39.0–52.0)
Hemoglobin: 12.2 g/dL — ABNORMAL LOW (ref 13.0–17.0)
Immature Granulocytes: 1 %
Lymphocytes Relative: 25 %
Lymphs Abs: 2.2 10*3/uL (ref 0.7–4.0)
MCH: 30.8 pg (ref 26.0–34.0)
MCHC: 31.8 g/dL (ref 30.0–36.0)
MCV: 97 fL (ref 80.0–100.0)
Monocytes Absolute: 0.7 10*3/uL (ref 0.1–1.0)
Monocytes Relative: 7 %
Neutro Abs: 5.8 10*3/uL (ref 1.7–7.7)
Neutrophils Relative %: 66 %
Platelets: 276 10*3/uL (ref 150–400)
RBC: 3.96 MIL/uL — ABNORMAL LOW (ref 4.22–5.81)
RDW: 13.4 % (ref 11.5–15.5)
WBC: 8.8 10*3/uL (ref 4.0–10.5)
nRBC: 0 % (ref 0.0–0.2)

## 2022-12-12 LAB — COMPREHENSIVE METABOLIC PANEL
ALT: 28 U/L (ref 0–44)
AST: 22 U/L (ref 15–41)
Albumin: 3.1 g/dL — ABNORMAL LOW (ref 3.5–5.0)
Alkaline Phosphatase: 59 U/L (ref 38–126)
Anion gap: 9 (ref 5–15)
BUN: 13 mg/dL (ref 6–20)
CO2: 24 mmol/L (ref 22–32)
Calcium: 8.3 mg/dL — ABNORMAL LOW (ref 8.9–10.3)
Chloride: 99 mmol/L (ref 98–111)
Creatinine, Ser: 0.87 mg/dL (ref 0.61–1.24)
GFR, Estimated: >60 mL/min (ref >60)
Glucose, Bld: 419 mg/dL — ABNORMAL HIGH (ref 70–99)
Potassium: 4 mmol/L (ref 3.5–5.1)
Sodium: 132 mmol/L — ABNORMAL LOW (ref 135–145)
Total Bilirubin: 0.3 mg/dL (ref 0.3–1.2)
Total Protein: 6.1 g/dL — ABNORMAL LOW (ref 6.5–8.1)

## 2022-12-12 LAB — BRAIN NATRIURETIC PEPTIDE: B Natriuretic Peptide: 23.5 pg/mL (ref 0.0–100.0)

## 2022-12-12 MED ORDER — ENOXAPARIN SODIUM 100 MG/ML IJ SOSY
1.0000 mg/kg | PREFILLED_SYRINGE | Freq: Once | INTRAMUSCULAR | Status: AC
  Administered 2022-12-12: 85 mg via SUBCUTANEOUS
  Filled 2022-12-12: qty 1

## 2022-12-12 NOTE — ED Provider Notes (Signed)
I saw and evaluated the patient, reviewed the resident's note and I agree with the findings and plan.   52 year old male presents with 1 day of swelling to his right lower extremity.  No prior DVT.  On exam, he is neuro vas intact at his right foot.  Concern for possible DVT.  Will give dose of Lovenox here and patient to return the morning for an outpatient DVT study       Lorre Nick, MD 12/12/22 2148

## 2022-12-12 NOTE — ED Triage Notes (Signed)
Pt c/o bilateral lower leg swelling, right worse than left, that started today. Pt being treated for staph infection, finished his doxycycline.

## 2022-12-12 NOTE — ED Provider Notes (Signed)
[place] EMERGENCY DEPARTMENT AT [facility] Provider Note   CSN: [id number] Arrival date & time: 12/12/22  1857     History  Chief Complaint  Patient presents with   Leg Swelling    Richard Carroll is a 52 y.o. male past medical history of type II diabetes - insulin dependent, crohn's disease    HPI  Patient reports around 5 o clock this morning, he was coming out of shower and he noticed his R leg and R foot swollen compared to left lower extremity. Patient reports he went to work afterwards and came back home around 4 PM. He sat on his recliner chair and noted that his R calf felt tight, had a throbbing sensation. Denies any recent lower extremity swelling. Patient reports numbness and tingling of his R foot. ROM intact bilaterally. Gait is normal. Patient reports his blood sugars are in the range of 200-400 range. Patient reports he is taking doxycycline for abscess and cellulitis of the supra pubic area, he finished his dose yesterday.   Patient takes long acting AM and at bedtime 12 units, and short acting SSI. Patient reports taking prednisone taper for crohn's disease, he is currently taking 20 mg prednisone. Patient is also taking Jardiance.    Otherwise patient denies headaches, cough, congestion, sore throat, chest pain, shortness of breath, abdominal pain, nausea or vomiting, diarrhea, dysuria.   Home Medications Prior to Admission medications   Medication Sig Start Date End Date Taking? Authorizing Provider  Blood Glucose Monitoring Suppl DEVI 1 each by Does not apply route 3 (three) times daily. May dispense any manufacturer covered by patient's insurance. 10/27/22   Lorin Glass, MD  Continuous Glucose Sensor (DEXCOM G7 SENSOR) MISC Dexcom glucose monitoring sensor 11/19/22   Burchette, Elberta Fortis, MD  empagliflozin (JARDIANCE) 10 MG TABS tablet Take 1 tablet (10 mg total) by mouth daily before breakfast. 11/23/22   Burchette, Elberta Fortis, MD  glucose blood (CONTOUR  TEST) test strip Use as instructed 10/27/22   Dahal, Melina Schools, MD  insulin aspart (NOVOLOG) 100 UNIT/ML FlexPen Inject 0-6 Units into the skin 3 (three) times daily with meals. Check Blood Glucose (BG) and inject per scale: BG <150= 0 unit; BG 150-200= 1 unit; BG 201-250= 2 unit; BG 251-300= 3 unit; BG 301-350= 4 unit; BG 351-400= 5 unit; BG >400= 6 unit and Call Primary Care. 10/27/22   Lorin Glass, MD  Insulin Glargine (BASAGLAR KWIKPEN) 100 UNIT/ML Inject 8 Units into the skin 2 (two) times daily. May substitute as needed per insurance. 10/27/22   Lorin Glass, MD  Insulin Pen Needle (PEN NEEDLES) 31G X 5 MM MISC 1 each by Does not apply route 3 (three) times daily. May dispense any manufacturer covered by patient's insurance. 10/27/22   Lorin Glass, MD  Lancet Device MISC 1 each by Does not apply route 3 (three) times daily. May dispense any manufacturer covered by patient's insurance. 10/27/22   Lorin Glass, MD  Lancets MISC 1 each by Does not apply route 3 (three) times daily. Use as directed to check blood sugar. May dispense any manufacturer covered by patient's insurance and fits patient's device. 10/27/22   Lorin Glass, MD  mesalamine (LIALDA) 1.2 g EC tablet Take 2 tablets (2.4 g total) by mouth 2 (two) times daily with a meal. 11/25/22 12/25/22  Cirigliano, Vito V, DO  Multiple Vitamin (ONE-A-DAY MENS PO) Take 1 tablet by mouth daily.    [provider]  predniSONE (DELTASONE) 10 MG tablet  Take 3 tablets (30 mg total) by mouth daily for 7 days, THEN 2.5 tablets (25 mg total) daily for 7 days, THEN 2 tablets (20 mg total) daily for 7 days, THEN 1.5 tablets (15 mg total) daily for 7 days, THEN 1 tablet (10 mg total) daily for 7 days, THEN 0.5 tablets (5 mg total) daily for 7 days. THEN discontinue. 11/24/22 01/05/23  Cirigliano, Vito V, DO  sildenafil (VIAGRA) 100 MG tablet Take 0.5-1 tablets (50-100 mg total) by mouth daily as needed for erectile dysfunction. 01/19/19   Burchette, Elberta Fortis,  MD  simvastatin (ZOCOR) 80 MG tablet TAKE 1/2 TABLET BY MOUTH EVERY DAY 05/20/22   Burchette, Elberta Fortis, MD      Allergies    Patient has no known allergies.    Review of Systems   Review of Systems  Physical Exam Updated Vital Signs BP 134/76 (BP Location: Right Arm)   Pulse 88   Temp 99.2 F (37.3 C) (Oral)   Resp 17   Ht 5\' 10"  (1.778 m)   Wt 84.8 kg   SpO2 99%   BMI 26.83 kg/m  Physical Exam  General: Well-developed, well-nourished, laying comfortably in bed, no acute distress Cardiovascular: Regular rate, normal rhythm. 2+ R ankle edema, 1+ distal RLE edema. No pitting edema LLE.  2+ DP pulses intact bilaterally.  Sensation intact bilaterally.  Right lower extremity swelling greater than left. Pulmonary: Lungs are clear to auscultation, no wheezing or crackles Abdomen: Soft, nondistended, nontender, bowel sounds are present MSK: ROM intact  ED Results / Procedures / Treatments   Labs (all labs ordered are listed, but only abnormal results are displayed) Labs Reviewed  CBC WITH DIFFERENTIAL/PLATELET - Abnormal; Notable for the following components:      Result Value   RBC 3.96 (*)    Hemoglobin 12.2 (*)    HCT 38.4 (*)    All other components within normal limits  COMPREHENSIVE METABOLIC PANEL - Abnormal; Notable for the following components:   Sodium 132 (*)    Glucose, Bld 419 (*)    Calcium 8.3 (*)    Total Protein 6.1 (*)    Albumin 3.1 (*)    All other components within normal limits  BRAIN NATRIURETIC PEPTIDE    EKG None  Radiology No results found.  Procedures Procedures  None  Medications Ordered in ED Medications - No data to display  ED Course/ Medical Decision Making/ A&P     Wells criteria: 2 points, moderate risk group for DVT.                             Medical Decision Making Amount and/or Complexity of Data Reviewed Labs: ordered.   This patient presents to the ED for concern of lateral right distal lower extremity swelling,  this involves an extensive number of treatment options, and is a complaint that carries with it a high risk of complications and morbidity.  The differential diagnosis includes DVT, lymphedema, venous insufficiency, medication induced swelling (corticosteroids), acute limb ischemia.   Co morbidities that complicate the patient evaluation  Crohn's disease and insulin-dependent type 2 diabetes  Additional history obtained:  Additional history obtained from none External records from outside source obtained and reviewed including none  Lab Tests:  I Ordered, and personally interpreted labs. The pertinent results include: electrolytes normal except for sodium 132, glucose 419, creatinine normal, BUN normal, liver enzymes normal, hemoglobin 12.2 stable, WBC normal, platelet normal,  glucose 419.  Imaging Studies ordered:  DVT studies ordered for tomorrow.  Cardiac Monitoring: / EKG:  The patient was maintained on a cardiac monitor.  I personally viewed and interpreted the cardiac monitored which showed an underlying rhythm of: NSR  Consultations Obtained:  No consultation needed  Problem List / ED Course / Critical interventions / Medication management  Acute unilateral right lower extremity pain and edema I ordered medication including Lovenox 85 mg subcu once Reevaluation of the patient after these medicines showed that the patient improved I have reviewed the patients home medicines and have made adjustments as needed   Social Determinants of Health:  Patient lives at home with his wife.   Test / Admission - Considered:  This is a 52 year old male with past medical history of type 2 diabetes insulin-dependent and Crohn's disease who presented to ED for acute unilateral right lower extremity swelling and pain. Right lower extremity swelling is greater than left. Labs are reassuring. BNP is normal. No trauma to the RLE. No infection noted RLE. Patient has 2+ dp pulses intact  bilateral LE. Unlikely acute arterial insufficiency. Patient's calculated Well's score for DVT is 2, moderate risk for DVT. Patient is given subcutaneous Lovenox injection 85 mg and advised to follow-up tomorrow for lower extremity venous ultrasound.      Final Clinical Impression(s) / ED Diagnoses Final diagnoses:  None    Rx / DC Orders ED Discharge Orders     None         Jeral Pinch, DO 12/12/22 2241    Lorre Nick, MD 12/14/22 1257

## 2022-12-12 NOTE — Discharge Instructions (Signed)
Return tomorrow for your DVT study as directed

## 2022-12-13 ENCOUNTER — Other Ambulatory Visit: Payer: Self-pay

## 2022-12-13 ENCOUNTER — Other Ambulatory Visit (HOSPITAL_COMMUNITY): Payer: Self-pay

## 2022-12-13 ENCOUNTER — Emergency Department (HOSPITAL_COMMUNITY)
Admission: EM | Admit: 2022-12-13 | Discharge: 2022-12-13 | Disposition: A | Discharge status: Home / Self Care | Payer: BC Managed Care – PPO | Attending: Emergency Medicine | Admitting: Emergency Medicine

## 2022-12-13 ENCOUNTER — Ambulatory Visit (HOSPITAL_BASED_OUTPATIENT_CLINIC_OR_DEPARTMENT_OTHER)
Admission: RE | Admit: 2022-12-13 | Discharge: 2022-12-13 | Disposition: A | Discharge status: Home / Self Care | Payer: BC Managed Care – PPO | Source: Ambulatory Visit | Attending: Emergency Medicine | Admitting: Emergency Medicine

## 2022-12-13 DIAGNOSIS — M7989 Other specified soft tissue disorders: Secondary | ICD-10-CM

## 2022-12-13 DIAGNOSIS — I824Z1 Acute embolism and thrombosis of unspecified deep veins of right distal lower extremity: Secondary | ICD-10-CM | POA: Diagnosis not present

## 2022-12-13 DIAGNOSIS — M79604 Pain in right leg: Secondary | ICD-10-CM | POA: Diagnosis not present

## 2022-12-13 DIAGNOSIS — I82401 Acute embolism and thrombosis of unspecified deep veins of right lower extremity: Secondary | ICD-10-CM | POA: Diagnosis not present

## 2022-12-13 MED ORDER — RIVAROXABAN (XARELTO) VTE STARTER PACK (15 & 20 MG)
ORAL_TABLET | ORAL | 0 refills | Status: DC
Start: 2022-12-13 — End: 2022-12-13

## 2022-12-13 MED ORDER — RIVAROXABAN (XARELTO) VTE STARTER PACK (15 & 20 MG)
ORAL_TABLET | ORAL | 0 refills | Status: DC
Start: 2022-12-13 — End: 2023-02-02
  Filled 2022-12-13: qty 51, 30d supply, fill #0

## 2022-12-13 NOTE — Discharge Instructions (Addendum)
Thank you for allowing [place] to be a part of your care today.  You were evaluated in the ED for swelling to your right lower leg.  Your ultrasound was positive for a DVT.  I have sent over the Xarelto starter pack to your pharmacy.  This is an oral blood thinner that you will take daily.  Please take as prescribed.  Please call the vascular and vein specialists and ask for an appointment with the DVT clinic.   I have attached safety information regarding the use of Xarelto.  Do not take any NSAIDs such as naproxen or ibuprofen while on this medication as it increases your risk for bleeding.  Return to the ED if you develop sudden worsening of your symptoms, have shortness of breath, bleeding that does not stop, or have dark, black stools.

## 2022-12-13 NOTE — ED Triage Notes (Signed)
Patient arrives after outpatient vascular study that showed +dvt to RLE. Denies sob.

## 2022-12-13 NOTE — ED Provider Notes (Signed)
[place] EMERGENCY DEPARTMENT AT [facility] Provider Note   CSN: [id number] Arrival date & time: 12/13/22  1151     History  No chief complaint on file.   [place] Carroll is a 52 y.o. male with past medical history significant for type 2 diabetes, hyperlipidemia, Crohn's presents to the ED after outpatient vascular study showed positive DVT to right lower extremity.  Patient was evaluated in the ED yesterday.  Patient noticed his right leg and right foot were swollen yesterday morning.  He reports that as the day went on he noticed his right calf felt tight and had a throbbing sensation.  He does not have a prior history of DVT or PE.  Denies shortness of breath, chest pain, fever, palpitations, chest tightness.         Home Medications Prior to Admission medications   Medication Sig Start Date End Date Taking? Authorizing Provider  Blood Glucose Monitoring Suppl DEVI 1 each by Does not apply route 3 (three) times daily. May dispense any manufacturer covered by patient's insurance. 10/27/22   Lorin Glass, MD  Continuous Glucose Sensor (DEXCOM G7 SENSOR) MISC Dexcom glucose monitoring sensor 11/19/22   Burchette, Elberta Fortis, MD  empagliflozin (JARDIANCE) 10 MG TABS tablet Take 1 tablet (10 mg total) by mouth daily before breakfast. 11/23/22   Burchette, Elberta Fortis, MD  glucose blood (CONTOUR TEST) test strip Use as instructed 10/27/22   Dahal, Melina Schools, MD  insulin aspart (NOVOLOG) 100 UNIT/ML FlexPen Inject 0-6 Units into the skin 3 (three) times daily with meals. Check Blood Glucose (BG) and inject per scale: BG <150= 0 unit; BG 150-200= 1 unit; BG 201-250= 2 unit; BG 251-300= 3 unit; BG 301-350= 4 unit; BG 351-400= 5 unit; BG >400= 6 unit and Call Primary Care. 10/27/22   Lorin Glass, MD  Insulin Glargine (BASAGLAR KWIKPEN) 100 UNIT/ML Inject 8 Units into the skin 2 (two) times daily. May substitute as needed per insurance. 10/27/22   Lorin Glass, MD  Insulin Pen Needle (PEN  NEEDLES) 31G X 5 MM MISC 1 each by Does not apply route 3 (three) times daily. May dispense any manufacturer covered by patient's insurance. 10/27/22   Lorin Glass, MD  Lancet Device MISC 1 each by Does not apply route 3 (three) times daily. May dispense any manufacturer covered by patient's insurance. 10/27/22   Lorin Glass, MD  Lancets MISC 1 each by Does not apply route 3 (three) times daily. Use as directed to check blood sugar. May dispense any manufacturer covered by patient's insurance and fits patient's device. 10/27/22   Lorin Glass, MD  mesalamine (LIALDA) 1.2 g EC tablet Take 2 tablets (2.4 g total) by mouth 2 (two) times daily with a meal. 11/25/22 12/25/22  Cirigliano, Vito V, DO  Multiple Vitamin (ONE-A-DAY MENS PO) Take 1 tablet by mouth daily.    [provider]  predniSONE (DELTASONE) 10 MG tablet Take 3 tablets (30 mg total) by mouth daily for 7 days, THEN 2.5 tablets (25 mg total) daily for 7 days, THEN 2 tablets (20 mg total) daily for 7 days, THEN 1.5 tablets (15 mg total) daily for 7 days, THEN 1 tablet (10 mg total) daily for 7 days, THEN 0.5 tablets (5 mg total) daily for 7 days. THEN discontinue. 11/24/22 01/05/23  Cirigliano, Vito V, DO  RIVAROXABAN (XARELTO) VTE STARTER PACK (15 & 20 MG) Follow package directions: Take one 15mg  tablet by mouth twice a day for the first 21 days. On  day 22, switch to one 20mg  tablet once a day. Take with food. 12/13/22   Haleem Hanner R, PA-C  sildenafil (VIAGRA) 100 MG tablet Take 0.5-1 tablets (50-100 mg total) by mouth daily as needed for erectile dysfunction. 01/19/19   Burchette, Elberta Fortis, MD  simvastatin (ZOCOR) 80 MG tablet TAKE 1/2 TABLET BY MOUTH EVERY DAY 05/20/22   Burchette, Elberta Fortis, MD  TRULICITY 0.75 MG/0.5ML SOPN Inject 0.75 mg into the skin once a week. 11/15/22   [provider]      Allergies    Patient has no known allergies.    Review of Systems   Review of Systems  Constitutional:  Negative for fever.   Respiratory:  Negative for chest tightness and shortness of breath.   Cardiovascular:  Positive for leg swelling (RLE). Negative for chest pain and palpitations.    Physical Exam Updated Vital Signs BP 117/79 (BP Location: Right Arm)   Pulse 78   Temp 98.2 F (36.8 C) (Oral)   Resp 17   SpO2 93%  Physical Exam Vitals and nursing note reviewed.  Constitutional:      General: He is not in acute distress.    Appearance: Normal appearance. He is not ill-appearing or diaphoretic.  Cardiovascular:     Rate and Rhythm: Normal rate and regular rhythm.  Pulmonary:     Effort: Pulmonary effort is normal.  Musculoskeletal:     Right lower leg: Swelling and tenderness (calf) present. 2+ Edema present.     Comments: R > L swelling to lower extremities.  Mild erythema and increased warmth to right calf.   Swelling extends into R foot.  DP pulse is 2+.    Skin:    General: Skin is warm and dry.     Capillary Refill: Capillary refill takes less than 2 seconds.  Neurological:     Mental Status: He is alert. Mental status is at baseline.  Psychiatric:        Mood and Affect: Mood normal.        Behavior: Behavior normal.     ED Results / Procedures / Treatments   Labs (all labs ordered are listed, but only abnormal results are displayed) Labs Reviewed - No data to display  EKG None  Radiology LE VENOUS  Result Date: 12/13/2022  Lower Venous DVT Study Patient Name:  Richard Carroll  Date of Exam:   12/13/2022 Medical Rec #: [id number]          Accession #:    [id number] Date of Birth: March 19, 1971         Patient Gender: M Patient Age:   76 years Exam Location:  [facility] Procedure:      VAS [place] LOWER EXTREMITY VENOUS (DVT) Referring Phys: Lorre Nick --------------------------------------------------------------------------------  Indications: Swelling.  Comparison Study: No prior study on file Performing Technologist: Sherren Kerns RVS  Examination Guidelines: A complete  evaluation includes B-mode imaging, spectral Doppler, color Doppler, and power Doppler as needed of all accessible portions of each vessel. Bilateral testing is considered an integral part of a complete examination. Limited examinations for reoccurring indications may be performed as noted. The reflux portion of the exam is performed with the patient in reverse Trendelenburg.  +---------+---------------+---------+-----------+----------+--------------+ RIGHT    CompressibilityPhasicitySpontaneityPropertiesThrombus Aging +---------+---------------+---------+-----------+----------+--------------+ CFV      Full           Yes      Yes                                 +---------+---------------+---------+-----------+----------+--------------+  SFJ      Full                                                        +---------+---------------+---------+-----------+----------+--------------+ FV Prox  Full           Yes      No                                  +---------+---------------+---------+-----------+----------+--------------+ FV Mid   None           No       No                   Acute          +---------+---------------+---------+-----------+----------+--------------+ FV DistalNone           No       No                   Acute          +---------+---------------+---------+-----------+----------+--------------+ PFV      Full           Yes      No                                  +---------+---------------+---------+-----------+----------+--------------+ POP      None           No       No                   Acute          +---------+---------------+---------+-----------+----------+--------------+ PTV      Full                                                        +---------+---------------+---------+-----------+----------+--------------+ PERO     None           No       No                   Acute           +---------+---------------+---------+-----------+----------+--------------+ Gastroc  Full                                                        +---------+---------------+---------+-----------+----------+--------------+   +----+---------------+---------+-----------+----------+--------------+ LEFTCompressibilityPhasicitySpontaneityPropertiesThrombus Aging +----+---------------+---------+-----------+----------+--------------+ CFV Full           Yes      Yes                                 +----+---------------+---------+-----------+----------+--------------+     Summary: RIGHT: - Findings consistent with acute deep vein thrombosis involving the right femoral vein, right popliteal vein, and right peroneal veins.  - No cystic structure found in the popliteal  fossa.  LEFT: - No evidence of common femoral vein obstruction.   *See table(s) above for measurements and observations.    Preliminary     Procedures Procedures    Medications Ordered in ED Medications - No data to display  ED Course/ Medical Decision Making/ A&P                                 Medical Decision Making  This patient presents to the ED with chief complaint(s) of RLE swelling with pertinent past medical history of diabetes.  The complaint involves an extensive differential diagnosis and also carries with it a high risk of complications and morbidity.    Patient returns to the ED after outpatient DVT study positive for DVT in RLE.   Initial Assessment:   Exam significant for right lower extremity erythema and swelling.  RLE greater in size than LLE.  Mild tenderness with palpation of the right calf muscle.  Swelling extends from right lower leg into right foot.  DP pulse 2+.  Sensation grossly intact.  Increased warmth over the right calf.  Reviewed results from outpatient US DVT.  Findings consistent with acute deep vein thrombosis involving the right femoral vein, right popliteal vein, and right peroneal  veins.  Disposition:   Patient positive for right lower extremity DVT.  Will send him home on Xarelto starter pack.  Patient provided with vein and vascular specialist follow-up information.  Advised patient to call this number and schedule an appointment with outpatient DVT clinic.  Patient verbalizes his understanding of the importance of follow-up.  Discussed adverse effects and risks of taking a blood thinner.  Patient provided with information about this medication.  The patient has been appropriately medically screened and/or stabilized in the ED. I have low suspicion for any other emergent medical condition which would require further screening, evaluation or treatment in the ED or require inpatient management. At time of discharge the patient is hemodynamically stable and in no acute distress. I have discussed work-up results and diagnosis with patient and answered all questions. Patient is agreeable with discharge plan. We discussed strict return precautions for returning to the emergency department and they verbalized understanding.            Final Clinical Impression(s) / ED Diagnoses Final diagnoses:  Acute deep vein thrombosis (DVT) of distal vein of right lower extremity (HCC)    Rx / DC Orders ED Discharge Orders          Ordered    RIVAROXABAN (XARELTO) VTE STARTER PACK (15 & 20 MG)  Status:  Discontinued        12/13/22 1224    RIVAROXABAN (XARELTO) VTE STARTER PACK (15 & 20 MG)        12/13/22 1315              Lenard Simmer, PA-C 12/13/22 1326    Gerhard Munch, MD 12/13/22 1648

## 2022-12-13 NOTE — Progress Notes (Signed)
VASCULAR LAB   Right lower extremity venous duplex has been performed.  See CV proc for preliminary results.   Alaina Donati, RVT 12/13/2022, 11:38 AM

## 2022-12-21 ENCOUNTER — Encounter (HOSPITAL_COMMUNITY): Payer: Self-pay

## 2022-12-21 ENCOUNTER — Ambulatory Visit (HOSPITAL_COMMUNITY)
Admission: RE | Admit: 2022-12-21 | Discharge: 2022-12-21 | Disposition: A | Discharge status: Home / Self Care | Payer: BC Managed Care – PPO | Source: Ambulatory Visit | Attending: Vascular Surgery | Admitting: Vascular Surgery

## 2022-12-21 ENCOUNTER — Other Ambulatory Visit (HOSPITAL_COMMUNITY): Payer: Self-pay

## 2022-12-21 VITALS — BP 108/77 | HR 81

## 2022-12-21 DIAGNOSIS — I82411 Acute embolism and thrombosis of right femoral vein: Secondary | ICD-10-CM | POA: Insufficient documentation

## 2022-12-21 MED ORDER — RIVAROXABAN 20 MG PO TABS: 20.0000 mg | ORAL_TABLET | Freq: Every day | ORAL | 5 refills | Status: DC

## 2022-12-21 NOTE — Progress Notes (Addendum)
DVT Clinic Note  Name: Richard Carroll     MRN: [id number]     DOB: January 11, 1971     Sex: male  PCP: Kristian Covey, MD  Today's Visit: Visit Information: Initial Visit  Referred to DVT Clinic by: Emergency Department - Bobbye Riggs, PA-C Referred to CPP by: Dr. Edilia Bo Reason for referral:  Chief Complaint  Patient presents with   Med Management - DVT   HISTORY OF PRESENT ILLNESS: Richard Carroll is a 52 y.o. male with PMH T2DM, HLD, recently diagnosed Crohn's disease, who presents for follow up medication management for DVT. He presented to the ED 12/12/22 with RLE swelling. He returned the following day for vascular ultrasound which showed acute DVT involving the right femoral, popliteal, and peroneal veins. Of note, he was admitted to [facility] 10/22/22-10/27/22 with N/V/D, found to be in DKA and was also diagnosed with severe Crohn's disease.   Today patient is ambulating independently and is accompanied by his wife Cheri. He reports feeling much better since starting to take Xarelto. Denies abnormal bleeding or bruising. Denies missed doses of Xarelto. Has been wearing compression stockings which has significantly helped his swelling. He is on his feet for most of the work day, and the compression stockings keeps him from having swelling at the end of the day. He tells me that when he was in the hospital in July he had bilateral foot edema that resolved after discharge, and he had no pain or swelling in either leg until he woke up last Sunday 12/12/22 with RLE swelling and went to the ED. He was out of work for 1 month after his July admission and went back to work on 12/01/22. Reports he remained active while at home, denies spending long periods of time sitting. No prior history of DVT. Patient was recently diagnosed with severe Crohn's colitis. He has been on mesalamine and prednisone, which is now being tapered. Laurette Schimke was trying to start him on Remicade or Rinvoq but due to complications  with insurance and the patient's preference to avoid infusions, the patient opted to treat with supplements instead which he said is controlling his symptoms.   Positive Thrombotic Risk Factors: Recent admission to hospital with acute illness (within 3 months), Non-malignant, chronic inflammatory condition Bleeding Risk Factors: Anticoagulant therapy  Negative Thrombotic Risk Factors: Previous VTE, Recent surgery (within 3 months), Recent trauma (within 3 months), Paralysis, paresis, or recent plaster cast immobilization of lower extremity, Central venous catheterization, Bed rest >72 hours within 3 months, Sedentary journey lasting >8 hours within 4 weeks, Pregnancy, Within 6 weeks postpartum, Recent cesarean section (within 3 months), Estrogen therapy, Testosterone therapy, Erythropoiesis-stimulating agent, Recent COVID diagnosis (within 3 months), Active cancer, Known thrombophilic condition, Smoking, Older age, Obesity  Rx Insurance Coverage: Commercial Rx Affordability: Xarelto is $0 on his insurance for a 30 or 90 day supply. Even if deductible goes up in new year, he will be eligible for copay cards since he has Nurse, learning disability.  Preferred Pharmacy: [facility]   Past Medical History:  Diagnosis Date   Arthritis    Crohn's colitis, with rectal bleeding (HCC) 10/22/2022   Diabetes mellitus type II    DIABETES MELLITUS, TYPE II 08/18/2009   on meds   Hay fever    with allergies   Hyperlipidemia    HYPERLIPIDEMIA 08/18/2009   on meds   Seasonal allergies     Past Surgical History:  Procedure Laterality Date   BIOPSY  10/25/2022   Procedure: BIOPSY;  Surgeon: Meryl Dare, MD;  Location: [facility];  Service: Gastroenterology;;   COLONOSCOPY  01/2021   COLONOSCOPY WITH PROPOFOL N/A 10/25/2022   Procedure: COLONOSCOPY WITH PROPOFOL;  Surgeon: Meryl Dare, MD;  Location: [facility];  Service: Gastroenterology;  Laterality: N/A;   FINGER  SURGERY Right    ring finger   KNEE SURGERY Right 2005    Social History   Socioeconomic History   Marital status: Married    Spouse name: Not on file   Number of children: Not on file   Years of education: Not on file   Highest education level: 12th grade  Occupational History   Not on file  Tobacco Use   Smoking status: Never   Smokeless tobacco: Current    Types: Chew  Vaping Use   Vaping status: Never Used  Substance and Sexual Activity   Alcohol use: Not Currently   Drug use: No   Sexual activity: Not on file  Other Topics Concern   Not on file  Social History Narrative   Not on file   Social Determinants of Health   Financial Resource Strain: Low Risk  (11/22/2022)   Overall Financial Resource Strain (CARDIA)    Difficulty of Paying Living Expenses: Not very hard  Food Insecurity: No Food Insecurity (11/22/2022)   Hunger Vital Sign    Worried About Running Out of Food in the Last Year: Never true    Ran Out of Food in the Last Year: Never true  Transportation Needs: No Transportation Needs (11/22/2022)   PRAPARE - Administrator, Civil Service (Medical): No    Lack of Transportation (Non-Medical): No  Physical Activity: Insufficiently Active (11/22/2022)   Exercise Vital Sign    Days of Exercise per Week: 2 days    Minutes of Exercise per Session: 10 min  Stress: Stress Concern Present (11/22/2022)   [employer] of Occupational Health - Occupational Stress Questionnaire    Feeling of Stress : To some extent  Social Connections: Unknown (11/22/2022)   Social Connection and Isolation Panel [NHANES]    Frequency of Communication with Friends and Family: Not on file    Frequency of Social Gatherings with Friends and Family: Once a week    Attends Religious Services: More than 4 times per year    Active Member of Clubs or Organizations: Yes    Attends Banker Meetings: More than 4 times per year    Marital Status: Married  Careers information officer Violence: Not At Risk (10/23/2022)   Humiliation, Afraid, Rape, and Kick questionnaire    Fear of Current or Ex-Partner: No    Emotionally Abused: No    Physically Abused: No    Sexually Abused: No    Family History  Problem Relation Age of Onset   Hyperlipidemia Mother    Stroke Mother    Hypertension Mother    Diabetes Mother    Prostate cancer Father 81       2004   Colon cancer Father 48       2013   Heart disease Father    Diabetes Maternal Grandmother    Alcohol abuse Maternal Grandfather    Colon cancer Paternal Uncle    Prostate cancer Paternal Uncle    Colon polyps Neg Hx    Esophageal cancer Neg Hx    Stomach cancer Neg Hx    Rectal cancer Neg Hx     Allergies as of 12/21/2022   (  No Known Allergies)    Current Outpatient Medications on File Prior to Encounter  Medication Sig Dispense Refill   empagliflozin (JARDIANCE) 10 MG TABS tablet Take 1 tablet (10 mg total) by mouth daily before breakfast. 30 tablet 11   insulin aspart (NOVOLOG) 100 UNIT/ML FlexPen Inject 0-6 Units into the skin 3 (three) times daily with meals. Check Blood Glucose (BG) and inject per scale: BG <150= 0 unit; BG 150-200= 1 unit; BG 201-250= 2 unit; BG 251-300= 3 unit; BG 301-350= 4 unit; BG 351-400= 5 unit; BG >400= 6 unit and Call Primary Care. 15 mL 0   Insulin Glargine (BASAGLAR KWIKPEN) 100 UNIT/ML Inject 8 Units into the skin 2 (two) times daily. May substitute as needed per insurance. 15 mL 0   mesalamine (LIALDA) 1.2 g EC tablet Take 2 tablets (2.4 g total) by mouth 2 (two) times daily with a meal. 120 tablet 1   Multiple Vitamin (ONE-A-DAY MENS PO) Take 1 tablet by mouth daily.     predniSONE (DELTASONE) 10 MG tablet Take 3 tablets (30 mg total) by mouth daily for 7 days, THEN 2.5 tablets (25 mg total) daily for 7 days, THEN 2 tablets (20 mg total) daily for 7 days, THEN 1.5 tablets (15 mg total) daily for 7 days, THEN 1 tablet (10 mg total) daily for 7 days, THEN 0.5 tablets (5 mg  total) daily for 7 days. THEN discontinue. 75 tablet 0   RIVAROXABAN (XARELTO) VTE STARTER PACK (15 & 20 MG) Follow package directions: Take one 15mg  tablet by mouth twice a day for the first 21 days. On day 22, switch to one 20mg  tablet once a day. Take with food. 51 each 0   simvastatin (ZOCOR) 80 MG tablet TAKE 1/2 TABLET BY MOUTH EVERY DAY 45 tablet 3   Blood Glucose Monitoring Suppl DEVI 1 each by Does not apply route 3 (three) times daily. May dispense any manufacturer covered by patient's insurance. 1 each 0   Continuous Glucose Sensor (DEXCOM G7 SENSOR) MISC Dexcom glucose monitoring sensor 3 each 3   glucose blood (CONTOUR TEST) test strip Use as instructed 250 each 0   Insulin Pen Needle (PEN NEEDLES) 31G X 5 MM MISC 1 each by Does not apply route 3 (three) times daily. May dispense any manufacturer covered by patient's insurance. 100 each 0   Lancet Device MISC 1 each by Does not apply route 3 (three) times daily. May dispense any manufacturer covered by patient's insurance. 1 each 0   Lancets MISC 1 each by Does not apply route 3 (three) times daily. Use as directed to check blood sugar. May dispense any manufacturer covered by patient's insurance and fits patient's device. 100 each 0   sildenafil (VIAGRA) 100 MG tablet Take 0.5-1 tablets (50-100 mg total) by mouth daily as needed for erectile dysfunction. 10 tablet 11   TRULICITY 0.75 MG/0.5ML SOPN Inject 0.75 mg into the skin once a week. (Patient not taking: Reported on 12/21/2022)     No current facility-administered medications on file prior to encounter.   REVIEW OF SYSTEMS:  Review of Systems  Respiratory:  Negative for shortness of breath.   Cardiovascular:  Negative for chest pain, palpitations and leg swelling.  Musculoskeletal:  Negative for myalgias.  Neurological:  Negative for dizziness.   PHYSICAL EXAMINATION:  Vitals:   12/21/22 1119  BP: 108/77  Pulse: 81  SpO2: 98%   Physical Exam Vitals reviewed.   Cardiovascular:     Rate and Rhythm:  Normal rate.  Pulmonary:     Effort: Pulmonary effort is normal.  Musculoskeletal:        General: No swelling or tenderness.  Skin:    Findings: No bruising or erythema.  Psychiatric:        Mood and Affect: Mood normal.        Behavior: Behavior normal.        Thought Content: Thought content normal.   Villalta Score for Post-Thrombotic Syndrome: Pain: Mild Cramps: Mild Heaviness: Mild Paresthesia: Mild Pruritus: Absent Pretibial Edema: Absent Skin Induration: Absent Hyperpigmentation: Absent Redness: Absent Venous Ectasia: Absent Pain on calf compression: Absent Villalta Preliminary Score: 4 Is venous ulcer present?: No If venous ulcer is present and score is <15, then 15 points total are assigned: Absent Villalta Total Score: 4  LABS:  CBC     Component Value Date/Time   WBC 8.8 12/12/2022 1926   RBC 3.96 (L) 12/12/2022 1926   HGB 12.2 (L) 12/12/2022 1926   HCT 38.4 (L) 12/12/2022 1926   PLT 276 12/12/2022 1926   MCV 97.0 12/12/2022 1926   MCH 30.8 12/12/2022 1926   MCHC 31.8 12/12/2022 1926   RDW 13.4 12/12/2022 1926   LYMPHSABS 2.2 12/12/2022 1926   MONOABS 0.7 12/12/2022 1926   EOSABS 0.1 12/12/2022 1926   BASOSABS 0.0 12/12/2022 1926    Hepatic Function      Component Value Date/Time   PROT 6.1 (L) 12/12/2022 1926   ALBUMIN 3.1 (L) 12/12/2022 1926   AST 22 12/12/2022 1926   ALT 28 12/12/2022 1926   ALKPHOS 59 12/12/2022 1926   BILITOT 0.3 12/12/2022 1926   BILIDIR <0.1 10/22/2022 1736   IBILI NOT CALCULATED 10/22/2022 1736    Renal Function   Lab Results  Component Value Date   CREATININE 0.87 12/12/2022   CREATININE 0.72 11/02/2022   CREATININE 0.64 10/27/2022    Estimated Creatinine Clearance: 104.9 mL/min (by C-G formula based on SCr of 0.87 mg/dL).   [facility] Vascular Lab Studies:  12/13/22 VAS [place] LOWER EXTREMITY VENOUS (DVT)RIGHT  Summary:  RIGHT:  - Findings consistent with acute deep vein  thrombosis involving the right  femoral vein, right popliteal vein, and right peroneal veins.   - No cystic structure found in the popliteal fossa.    LEFT:  - No evidence of common femoral vein obstruction.   ASSESSMENT: Location of DVT: Right femoral vein, Right popliteal vein, Right distal vein Risk factors for DVT include recent diagnosis of Crohn's disease and recent hospital admission in mid July, however DVT symptoms did not present until nearly 2 months after hospitalization. He was out of work for most of August but he denies prolonged sedentary time. Unclear what role this may have had in development of DVT since he had no swelling or pain until 12/12/22. He was recently diagnosed with severe Crohn's disease. He is not currently on any immune modulating therapy and has opted for natural supplements. With data indicating inflammatory bowel disease can be a persistent risk factor for DVT and guidelines recommending consideration of long-term anticoagulation, will refer to hematology for further workup of risk factors and recommendations on duration of treatment.   Patient is tolerating Xarelto well at this time. No abnormal bleeding, no recent bloody stool. He is taking Xarelto correctly. No concerns for medication access or adherence at this time. Provided him with refills to his preferred pharmacy today to start after completion of the starter pack.   PLAN: -Continue rivaroxaban (Xarelto) 15 mg twice  daily with food for 21 days followed by 20 mg daily with food. -Expected duration of therapy: per hematology. Therapy started on 12/13/22. -Patient educated on purpose, proper use and potential adverse effects of rivaroxaban (Xarelto). -Discussed importance of taking medication around the same time every day. -Advised patient of medications to avoid (NSAIDs, aspirin doses >100 mg daily). -Educated that Tylenol (acetaminophen) is the preferred analgesic to lower the risk of bleeding. -Advised  patient to alert all providers of anticoagulation therapy prior to starting a new medication or having a procedure. -Emphasized importance of monitoring for signs and symptoms of bleeding (abnormal bruising, prolonged bleeding, nose bleeds, bleeding from gums, discolored urine, black tarry stools). -Educated patient to present to the ED if emergent signs and symptoms of new thrombosis occur. -Counseled patient to wear compression stockings daily, removing at night.  Follow up: Referral to hematology placed. DVT Clinic available as needed.   Pervis Hocking, PharmD, [place], CPP Deep Vein Thrombosis Clinic Clinical Pharmacist Practitioner Office: [phone]  I have evaluated the patient's chart/imaging and refer this patient to the Clinical Pharmacist Practitioner for medication management. I have reviewed the CPP's documentation and agree with her assessment and plan. I was immediately available during the visit for questions and collaboration.   Waverly Ferrari, MD

## 2022-12-21 NOTE — Patient Instructions (Addendum)
-  Continue rivaroxaban (Xarelto) 15 mg twice daily with food for 21 days followed by 20 mg daily with food. -[facility] You may need to call the pharmacy to ask them to fill this when you start to run low on your current supply.  -We are referring you to a hematologist for further work up and recommendations on how long you need treatment with Xarelto.  -The DVT Clinic is available to you as needed.  -It is important to take your medication around the same time every day.  -Avoid NSAIDs like ibuprofen (Advil, Motrin) and naproxen (Aleve) as well as aspirin doses over 100 mg daily. -Tylenol (acetaminophen) is the preferred over the counter pain medication to lower the risk of bleeding. -Be sure to alert all of your health care providers that you are taking an anticoagulant prior to starting a new medication or having a procedure. -Monitor for signs and symptoms of bleeding (abnormal bruising, prolonged bleeding, nose bleeds, bleeding from gums, discolored urine, black tarry stools). If you have fallen and hit your head OR if your bleeding is severe or not stopping, seek emergency care.  -Go to the emergency room if emergent signs and symptoms of new clot occur (new or worse swelling and pain in an arm or leg, shortness of breath, chest pain, fast or irregular heartbeats, lightheadedness, dizziness, fainting, coughing up blood) or if you experience a significant color change (pale or blue) in the extremity that has the DVT.  -We recommend you wear compression stockings (20-30 mmHg) as long as you are having swelling or pain. Be sure to purchase the correct size and take them off at night. Continue elevation as well.   If you have any questions or need to reschedule an appointment, please call [phone] [facility].  If you are having an emergency, call 911 or present to the nearest emergency room.   What is a DVT?  -Deep vein thrombosis (DVT) is a condition in which a  blood clot forms in a vein of the deep venous system which can occur in the lower leg, thigh, pelvis, arm, or neck. This condition is serious and can be life-threatening if the clot travels to the arteries of the lungs and causing a blockage (pulmonary embolism, PE). A DVT can also damage veins in the leg, which can lead to long-term venous disease, leg pain, swelling, discoloration, and ulcers or sores (post-thrombotic syndrome).  -Treatment may include taking an anticoagulant medication to prevent more clots from forming and the current clot from growing, wearing compression stockings, and/or surgical procedures to remove or dissolve the clot.

## 2023-01-01 ENCOUNTER — Other Ambulatory Visit: Payer: Self-pay | Admitting: Gastroenterology

## 2023-01-10 ENCOUNTER — Other Ambulatory Visit: Payer: Self-pay | Admitting: Family Medicine

## 2023-01-10 ENCOUNTER — Encounter: Payer: Self-pay | Admitting: Family Medicine

## 2023-01-10 NOTE — Progress Notes (Unsigned)
[facility] CONSULT NOTE  Patient Care Team: Richard Covey, MD as PCP - General   ASSESSMENT & PLAN:  52 y.o.male referred to [facility] for history of Crohn's disease, hyperlipidemia, DM2. Richard Carroll has history of right lower extremity DVT.  First episode: 12/2022 right lower extremity Setting: hospitalization and illness < 2 months before diagnosis Risk factors: Crohn's disease, type 2 diabetes Treatment: Xarelto with excellent response. Symptoms resolved.   Recommend continue Xarelto to complete 3 months of therapeutic anticoagulation. Watch for new symptoms once off anticoagluation.  Patient education for risk factors and prevention of clotting We talked about modifiable risk factors.  Prevention of clotting like deep vein thrombosis including: Strong risk factors including fractures of lower limb, hospitalization for severe illness, such as heart failure, myocardial infarction, spinal cord injury, major trauma, hip or knee replacement, and previous VTE. avoid prolonged immobilization and moving extremities every 1-2 hours during long car rides or flights.  Taking a break and moving extremities if working in a job setting with prolonged sitting.   Avoid dehydration, especially in high altitude. Avoid cigarette smoking Avoid use of testosterone Maintaining healthy lifestyle to prevent development of diabetes.  Weight loss if BMI over 30.  Regular exercises but not extreme.   Other risk factors for clotting are surgery, hospitalization, inflammatory disease or severe infection, trauma or injuries from inflammatory state and stasis. If developing one-sided leg swelling, pain, color change, chest pain, sudden short of breath, difficulty taking deep breaths, taking deep breath with chest discomfort or pain, dizziness or heart racing sensation, go to the emergency room immediately for evaluation. If developing trauma, uncontrolled bleeding,  such as bloody stools report ED immediately. Avoid NSAIDs, aspirin while on blood thinner.  Patient should avoid elective surgery in the acute thrombosis period for 3 months.  Discussed bleeding precautions and avoid high risk activities for falling while on anticoagulation. Anticoagulants do not cause bleeding.  Rather, if bleeding occurs when one is on anticoagulation, it may take longer for the bleeding to stop due to reduce coagulation capacity.  I have not made a return appointment for the patient to come back. I would be happy to assist in perioperative DVT management in the future should he need any interruption of his anticoagulation therapy for elective procedures.  No orders of the defined types were placed in this encounter.  All questions were answered. The patient knows to call the clinic with any problems, questions or concerns.  Richard Sartorius, MD 10/8/202411:06 AM  CHIEF COMPLAINTS/PURPOSE OF CONSULTATION:  DVT  HISTORY OF PRESENTING ILLNESS:  Richard Carroll 52 y.o. male is here because of DVT  He was diagnosed of Crohn's in July 2024 with bloody stool, diarrhea. Report symptoms started in July after a vacation with constant diarrhea. He was started on mesalamine and steroid, which he completed. He denies more diarrhea in the last month and has normal stool, no bloody.  He came home from work in September and noted swelling and tightness in the leg. No difficulty or chest pain, or short of breath. 12/12/22 Richard Carroll presented to the ED after 1 day of swelling to his right lower extremity.  12/13/22 [place]: acute deep vein thrombosis involving the right femoral vein, right popliteal vein, and right peroneal veins. Patient was discharged on Xarelto.  The swelling improved in 2-3 days. The right leg was a lot bigger and now looks like similar to the left.  He has history of Crohn's  disease diagnosed in July 2024. He was hospitalized for 6 days.  He denies recent injury, history  of trauma, long distance travel, dehydration, recent surgery, or hospitalization. He denies smoking. He has diabetes on insulin. He does not work in sedentary job.  He had no prior history or diagnosis of cancer. His age appropriate screening programs are up-to-date. He had prior one knee surgery before and never had perioperative thromboembolic events. The patient denies use of testosterone replacement therapy. There is no family history of blood clots including 1 biologic sibling.  MEDICAL HISTORY:  Past Medical History:  Diagnosis Date   Arthritis    Crohn's colitis, with rectal bleeding (HCC) 10/22/2022   Diabetes mellitus type II    DIABETES MELLITUS, TYPE II 08/18/2009   on meds   Hay fever    with allergies   Hyperlipidemia    HYPERLIPIDEMIA 08/18/2009   on meds   Seasonal allergies     SURGICAL HISTORY: Past Surgical History:  Procedure Laterality Date   BIOPSY  10/25/2022   Procedure: BIOPSY;  Surgeon: Meryl Dare, MD;  Location: [facility];  Service: Gastroenterology;;   COLONOSCOPY  01/2021   COLONOSCOPY WITH PROPOFOL N/A 10/25/2022   Procedure: COLONOSCOPY WITH PROPOFOL;  Surgeon: Meryl Dare, MD;  Location: [facility];  Service: Gastroenterology;  Laterality: N/A;   FINGER SURGERY Right    ring finger   KNEE SURGERY Right 2005    SOCIAL HISTORY: Social History   Socioeconomic History   Marital status: Married    Spouse name: Not on file   Number of children: Not on file   Years of education: Not on file   Highest education level: 12th grade  Occupational History   Not on file  Tobacco Use   Smoking status: Never   Smokeless tobacco: Current    Types: Chew  Vaping Use   Vaping status: Never Used  Substance and Sexual Activity   Alcohol use: Not Currently   Drug use: No   Sexual activity: Not on file  Other Topics Concern   Not on file  Social History Narrative   Not on file   Social Determinants of Health   Financial Resource  Strain: Low Risk  (11/22/2022)   Overall Financial Resource Strain (CARDIA)    Difficulty of Paying Living Expenses: Not very hard  Food Insecurity: No Food Insecurity (11/22/2022)   Hunger Vital Sign    Worried About Running Out of Food in the Last Year: Never true    Ran Out of Food in the Last Year: Never true  Transportation Needs: No Transportation Needs (11/22/2022)   PRAPARE - Administrator, Civil Service (Medical): No    Lack of Transportation (Non-Medical): No  Physical Activity: Insufficiently Active (11/22/2022)   Exercise Vital Sign    Days of Exercise per Week: 2 days    Minutes of Exercise per Session: 10 min  Stress: Stress Concern Present (11/22/2022)   [employer] of Occupational Health - Occupational Stress Questionnaire    Feeling of Stress : To some extent  Social Connections: Unknown (11/22/2022)   Social Connection and Isolation Panel [NHANES]    Frequency of Communication with Friends and Family: Not on file    Frequency of Social Gatherings with Friends and Family: Once a week    Attends Religious Services: More than 4 times per year    Active Member of [employer] or Organizations: Yes    Attends Banker Meetings: More than 4  times per year    Marital Status: Married  Catering manager Violence: Not At Risk (10/23/2022)   Humiliation, Afraid, Rape, and Kick questionnaire    Fear of Current or Ex-Partner: No    Emotionally Abused: No    Physically Abused: No    Sexually Abused: No    FAMILY HISTORY: Family History  Problem Relation Age of Onset   Hyperlipidemia Mother    Stroke Mother    Hypertension Mother    Diabetes Mother    Prostate cancer Father 29       2004   Colon cancer Father 63       2013   Heart disease Father    Diabetes Maternal Grandmother    Alcohol abuse Maternal Grandfather    Colon cancer Paternal Uncle    Prostate cancer Paternal Uncle    Colon polyps Neg Hx    Esophageal cancer Neg Hx    Stomach cancer  Neg Hx    Rectal cancer Neg Hx     ALLERGIES:  has No Known Allergies.  MEDICATIONS:  Current Outpatient Medications  Medication Sig Dispense Refill   Blood Glucose Monitoring Suppl DEVI 1 each by Does not apply route 3 (three) times daily. May dispense any manufacturer covered by patient's insurance. 1 each 0   Continuous Glucose Sensor (DEXCOM G7 SENSOR) MISC Dexcom glucose monitoring sensor 3 each 3   empagliflozin (JARDIANCE) 10 MG TABS tablet Take 1 tablet (10 mg total) by mouth daily before breakfast. 30 tablet 11   glucose blood (CONTOUR TEST) test strip Use as instructed 250 each 0   insulin aspart (NOVOLOG) 100 UNIT/ML FlexPen Inject 0-6 Units into the skin 3 (three) times daily with meals. Check Blood Glucose (BG) and inject per scale: BG <150= 0 unit; BG 150-200= 1 unit; BG 201-250= 2 unit; BG 251-300= 3 unit; BG 301-350= 4 unit; BG 351-400= 5 unit; BG >400= 6 unit and Call Primary Care. 15 mL 0   Insulin Glargine (BASAGLAR KWIKPEN) 100 UNIT/ML Inject 8 Units into the skin 2 (two) times daily. May substitute as needed per insurance. 15 mL 0   Insulin Pen Needle (PEN NEEDLES) 31G X 5 MM MISC 1 each by Does not apply route 3 (three) times daily. May dispense any manufacturer covered by patient's insurance. 100 each 0   Lancet Device MISC 1 each by Does not apply route 3 (three) times daily. May dispense any manufacturer covered by patient's insurance. 1 each 0   Lancets MISC 1 each by Does not apply route 3 (three) times daily. Use as directed to check blood sugar. May dispense any manufacturer covered by patient's insurance and fits patient's device. 100 each 0   mesalamine (LIALDA) 1.2 g EC tablet Take 2 tablets (2.4 g total) by mouth 2 (two) times daily with a meal. 120 tablet 1   Multiple Vitamin (ONE-A-DAY MENS PO) Take 1 tablet by mouth daily.     rivaroxaban (XARELTO) 20 MG TABS tablet Take 1 tablet (20 mg total) by mouth daily with supper. Take with food. Start taking after  completion of starter pack. 30 tablet 5   RIVAROXABAN (XARELTO) VTE STARTER PACK (15 & 20 MG) Follow package directions: Take one 15mg  tablet by mouth twice a day for the first 21 days. On day 22, switch to one 20mg  tablet once a day. Take with food. 51 each 0   sildenafil (VIAGRA) 100 MG tablet Take 0.5-1 tablets (50-100 mg total) by mouth daily as needed for  erectile dysfunction. 10 tablet 11   simvastatin (ZOCOR) 80 MG tablet TAKE 1/2 TABLET BY MOUTH EVERY DAY 45 tablet 3   TRULICITY 0.75 MG/0.5ML SOPN Inject 0.75 mg into the skin once a week. (Patient not taking: Reported on 12/21/2022)     No current facility-administered medications for this visit.    REVIEW OF SYSTEMS:   Constitutional: Denies unexpected weight loss, night sweats Respiratory: Denies cough, shortness of breath Cardiovascular: Denies chest pain, chest discomfort Gastrointestinal:  Denies abdominal pain, bloody stool, melena, bloody stool currently  PHYSICAL EXAMINATION: ECOG PERFORMANCE STATUS: 0 - Asymptomatic  Vitals:   01/11/23 1028  BP: 115/68  Pulse: 85  Resp: 18  Temp: (!) 97.3 F (36.3 C)  SpO2: 98%   Filed Weights   01/11/23 1028  Weight: 208 lb 7 oz (94.5 kg)    GENERAL: alert, no distress and comfortable SKIN: skin color normal LUNGS: Effort normal and no respiratory distress.  Clear to auscultation HEART: regular rate & rhythm ABDOMEN: abdomen soft, non-tender Musculoskeletal: no cyanosis and edema.  Right lower extremity and left lower extremity without edema  LABORATORY DATA:  I have reviewed the data as listed   RADIOGRAPHIC STUDIES: I have personally reviewed the radiological images as listed and agreed with the findings in the report. LE VENOUS  Result Date: 12/13/2022  Lower Venous DVT Study Patient Name:  Richard Carroll  Date of Exam:   12/13/2022 Medical Rec #: [id number]          Accession #:    [id number] Date of Birth: February 03, 1971         Patient Gender: M Patient Age:   29 years  Exam Location:  [facility] Procedure:      VAS [place] LOWER EXTREMITY VENOUS (DVT) Referring Phys: Lorre Nick --------------------------------------------------------------------------------  Indications: Swelling.  Comparison Study: No prior study on file Performing Technologist: Sherren Kerns RVS  Examination Guidelines: A complete evaluation includes B-mode imaging, spectral Doppler, color Doppler, and power Doppler as needed of all accessible portions of each vessel. Bilateral testing is considered an integral part of a complete examination. Limited examinations for reoccurring indications may be performed as noted. The reflux portion of the exam is performed with the patient in reverse Trendelenburg.  +---------+---------------+---------+-----------+----------+--------------+ RIGHT    CompressibilityPhasicitySpontaneityPropertiesThrombus Aging +---------+---------------+---------+-----------+----------+--------------+ CFV      Full           Yes      Yes                                 +---------+---------------+---------+-----------+----------+--------------+ SFJ      Full                                                        +---------+---------------+---------+-----------+----------+--------------+ FV Prox  Full           Yes      No                                  +---------+---------------+---------+-----------+----------+--------------+ FV Mid   None           No       No  Acute          +---------+---------------+---------+-----------+----------+--------------+ FV DistalNone           No       No                   Acute          +---------+---------------+---------+-----------+----------+--------------+ PFV      Full           Yes      No                                  +---------+---------------+---------+-----------+----------+--------------+ POP      None           No       No                   Acute           +---------+---------------+---------+-----------+----------+--------------+ PTV      Full                                                        +---------+---------------+---------+-----------+----------+--------------+ PERO     None           No       No                   Acute          +---------+---------------+---------+-----------+----------+--------------+ Gastroc  Full                                                        +---------+---------------+---------+-----------+----------+--------------+   +----+---------------+---------+-----------+----------+--------------+ LEFTCompressibilityPhasicitySpontaneityPropertiesThrombus Aging +----+---------------+---------+-----------+----------+--------------+ CFV Full           Yes      Yes                                 +----+---------------+---------+-----------+----------+--------------+     Summary: RIGHT: - Findings consistent with acute deep vein thrombosis involving the right femoral vein, right popliteal vein, and right peroneal veins.  - No cystic structure found in the popliteal fossa.  LEFT: - No evidence of common femoral vein obstruction.   *See table(s) above for measurements and observations. Electronically signed by Sherald Hess MD on 12/13/2022 at 4:06:06 PM.    Final

## 2023-01-11 ENCOUNTER — Telehealth: Payer: Self-pay

## 2023-01-11 ENCOUNTER — Inpatient Hospital Stay: Payer: BC Managed Care – PPO

## 2023-01-11 ENCOUNTER — Encounter: Payer: Self-pay | Admitting: Gastroenterology

## 2023-01-11 VITALS — BP 115/68 | HR 85 | Temp 97.3°F | Resp 18 | Wt 208.4 lb

## 2023-01-11 DIAGNOSIS — Z794 Long term (current) use of insulin: Secondary | ICD-10-CM | POA: Diagnosis not present

## 2023-01-11 DIAGNOSIS — K50911 Crohn's disease, unspecified, with rectal bleeding: Secondary | ICD-10-CM | POA: Insufficient documentation

## 2023-01-11 DIAGNOSIS — Z7984 Long term (current) use of oral hypoglycemic drugs: Secondary | ICD-10-CM | POA: Insufficient documentation

## 2023-01-11 DIAGNOSIS — I82411 Acute embolism and thrombosis of right femoral vein: Secondary | ICD-10-CM | POA: Diagnosis not present

## 2023-01-11 DIAGNOSIS — Z7901 Long term (current) use of anticoagulants: Secondary | ICD-10-CM | POA: Insufficient documentation

## 2023-01-11 DIAGNOSIS — E119 Type 2 diabetes mellitus without complications: Secondary | ICD-10-CM | POA: Diagnosis not present

## 2023-01-11 DIAGNOSIS — Z79899 Other long term (current) drug therapy: Secondary | ICD-10-CM | POA: Insufficient documentation

## 2023-01-11 NOTE — Telephone Encounter (Signed)
Transition Care Management Unsuccessful Follow-up Telephone Call  Date of discharge and from where:  [facility] 9/9  Attempts:  2nd Attempt  Reason for unsuccessful TCM follow-up call:  No answer/busy   Lenard Forth Brandon  [facility] Guide, Phone: [phone] Website: [email or web address]

## 2023-01-11 NOTE — Telephone Encounter (Signed)
Transition Care Management Unsuccessful Follow-up Telephone Call  Date of discharge and from where:  [facility] 9/9  Attempts:  1st Attempt  Reason for unsuccessful TCM follow-up call:  No answer/busy

## 2023-01-11 NOTE — Patient Instructions (Signed)
Recommend continue Xarelto to complete 3 months of blood thinner. Watch for new symptoms once off blood thinner.  Patient education for risk factors and prevention of clotting We talked about modifiable risk factors.  Prevention of clotting like deep vein thrombosis including: Strong risk factors including fractures of lower limb, hospitalization for severe illness, such as heart failure, myocardial infarction, spinal cord injury, major trauma, hip or knee replacement, and previous clot. avoid prolonged immobilization and moving extremities every 1-2 hours during long car rides or flights.  Taking a break and moving extremities if working in a job setting with prolonged sitting.   Avoid dehydration, especially in high altitude. Avoid cigarette smoking Avoid use of testosterone Maintaining healthy lifestyle to prevent development of diabetes.  Weight loss if BMI over 30.  Regular exercises but not extreme.   Other risk factors for clotting are surgery, hospitalization, inflammatory disease or severe infection, trauma or injuries from inflammatory state and stasis. If developing one-sided leg swelling, pain, color change, chest pain, sudden short of breath, difficulty taking deep breaths, taking deep breath with chest discomfort or pain, dizziness or heart racing sensation, go to the emergency room immediately for evaluation. If developing trauma, uncontrolled bleeding, such as bloody stools report ED immediately. Avoid NSAIDs, aspirin while on blood thinner.  Patient should avoid elective surgery in the acute thrombosis period for 3 months.  Discussed bleeding precautions and avoid high risk activities for falling while on anticoagulation. Anticoagulants do not cause bleeding.  Rather, if bleeding occurs when one is on anticoagulation, it may take longer for the bleeding to stop due to reduce coagulation capacity.

## 2023-01-13 MED ORDER — PEN NEEDLES 31G X 5 MM MISC: 1.0000 | Freq: Three times a day (TID) | 0 refills | Status: DC

## 2023-01-13 NOTE — Telephone Encounter (Signed)
Attempted to reach pt. Left a voice message to call us back.

## 2023-01-18 NOTE — Telephone Encounter (Signed)
Spoke to pt. Pt reports he is not on the Rx right now but is on insulin from hospital. Inform pt, we will decline this refill request. Verbalized understanding.

## 2023-01-21 ENCOUNTER — Other Ambulatory Visit: Payer: Self-pay | Admitting: Gastroenterology

## 2023-01-24 DIAGNOSIS — H05012 Cellulitis of left orbit: Secondary | ICD-10-CM | POA: Diagnosis not present

## 2023-01-26 DIAGNOSIS — H05012 Cellulitis of left orbit: Secondary | ICD-10-CM | POA: Diagnosis not present

## 2023-02-02 ENCOUNTER — Ambulatory Visit (INDEPENDENT_AMBULATORY_CARE_PROVIDER_SITE_OTHER): Payer: BC Managed Care – PPO | Admitting: Gastroenterology

## 2023-02-02 ENCOUNTER — Encounter: Payer: Self-pay | Admitting: Gastroenterology

## 2023-02-02 ENCOUNTER — Other Ambulatory Visit (INDEPENDENT_AMBULATORY_CARE_PROVIDER_SITE_OTHER): Payer: BC Managed Care – PPO

## 2023-02-02 VITALS — BP 122/76 | HR 86 | Ht 70.0 in | Wt 216.0 lb

## 2023-02-02 DIAGNOSIS — K5010 Crohn's disease of large intestine without complications: Secondary | ICD-10-CM

## 2023-02-02 DIAGNOSIS — I82411 Acute embolism and thrombosis of right femoral vein: Secondary | ICD-10-CM

## 2023-02-02 DIAGNOSIS — Z800 Family history of malignant neoplasm of digestive organs: Secondary | ICD-10-CM | POA: Diagnosis not present

## 2023-02-02 DIAGNOSIS — Z7901 Long term (current) use of anticoagulants: Secondary | ICD-10-CM

## 2023-02-02 DIAGNOSIS — D369 Benign neoplasm, unspecified site: Secondary | ICD-10-CM

## 2023-02-02 LAB — BASIC METABOLIC PANEL
BUN: 13 mg/dL (ref 6–23)
CO2: 27 meq/L (ref 19–32)
Calcium: 9.1 mg/dL (ref 8.4–10.5)
Chloride: 104 meq/L (ref 96–112)
Creatinine, Ser: 0.95 mg/dL (ref 0.40–1.50)
GFR: 93.04 mL/min (ref >60.00)
Glucose, Bld: 167 mg/dL — ABNORMAL HIGH (ref 70–99)
Potassium: 4 meq/L (ref 3.5–5.1)
Sodium: 140 meq/L (ref 135–145)

## 2023-02-02 LAB — SEDIMENTATION RATE: Sed Rate: 12 mm/h (ref 0–20)

## 2023-02-02 LAB — CBC
HCT: 48.7 % (ref 39.0–52.0)
Hemoglobin: 15.6 g/dL (ref 13.0–17.0)
MCHC: 31.9 g/dL (ref 30.0–36.0)
MCV: 92 fL (ref 78.0–100.0)
Platelets: 256 10*3/uL (ref 150.0–400.0)
RBC: 5.3 Mil/uL (ref 4.22–5.81)
RDW: 13.6 % (ref 11.5–15.5)
WBC: 7.7 10*3/uL (ref 4.0–10.5)

## 2023-02-02 LAB — C-REACTIVE PROTEIN: CRP: <1 mg/dL (ref 0.5–20.0)

## 2023-02-02 NOTE — Patient Instructions (Addendum)
_______________________________________________________  If your blood pressure at your visit was 140/90 or greater, please contact your primary care physician to follow up on this.  _______________________________________________________  If you are age 51 or older, your body mass index should be between 23-30. Your Body mass index is 30.99 kg/m. If this is out of the aforementioned range listed, please consider follow up with your Primary Care Provider.  If you are age 8 or younger, your body mass index should be between 19-25. Your Body mass index is 30.99 kg/m. If this is out of the aformentioned range listed, please consider follow up with your Primary Care Provider.   ________________________________________________________  The [facility] providers would like to encourage you to use [facility] to communicate with providers for non-urgent requests or questions.  Due to long hold times on the telephone, sending your provider a message by [facility] may be a faster and more efficient way to get a response.  Please allow 48 business hours for a response.  Please remember that this is for non-urgent requests.  _______________________________________________________  Your provider has requested that you go to the basement level for lab work before leaving today. Press "B" on the elevator. The lab is located at the first door on the left as you exit the elevator.  You will need a Dexa scan in 6 months.  We will contact you to schedule this appointment.  Due to recent changes in healthcare laws, you may see the results of your imaging and laboratory studies on MyChart before your provider has had a chance to review them.  We understand that in some cases there may be results that are confusing or concerning to you. Not all laboratory results come back in the same time frame and the provider may be waiting for multiple results in order to interpret others.  Please give [place] 48 hours in order for your  provider to thoroughly review all the results before contacting the office for clarification of your results.   It was a pleasure to see you today!  Vito Cirigliano, D.O.

## 2023-02-02 NOTE — Progress Notes (Signed)
Chief Complaint:    Crohn's disease  GI History: 52 year old male with a history of diverticulosis (with acute diverticulitis in 12/2015), diabetes, hyperlipidemia. Family history notable for father with colon cancer at age  <32 and paternal uncle with colon cancer.  GI history as below:  - 01/30/2021: Colonoscopy for initial screening: 5 subcentimeter tubular adenomas, rectal polyp (path: HP), sigmoid diverticulosis.  Patchy moderate inflammation with aphthous ulcers in the right colon and ICV with mild inflammatory changes in the left colon (path: Active colitis without chronic inflammatory changes).  Normal rectum.  Grade 1 internal hemorrhoids.  Normal TI - 01/2021: Normal ESR and CRP.  Fecal calprotectin not completed - 03/2021: Follow-up in the GI clinic.  No GI symptoms at all.  Elected for conservative management - 10/2022: Hospital admission for bloody diarrhea.  Negative infectious workup.  CT with diffuse colitis from transverse colon through rectosigmoid.    HPI:     Patient is a 52 y.o. male presenting to the Gastroenterology Clinic for follow-up.  Has not had follow-up since hospital admission in July as outlined below.  Hospital admission in 10/2022 with hematochezia and diagnosed with Crohn's colitis.  Was treated with Solu-Medrol with good response and transitioned to prednisone with prolonged outpatient taper. - CT A/P: Diffuse colitis from transverse colon through rectosigmoid - ESR normal, CRP 6.9.  Fecal calprotectin 2610 - 10/26/2022: Colonoscopy: Severe inflammation from rectum through hepatic flexure and at IC valve with erythema, friability, shallow ulcerations (path: Moderately active chronic colitis).  Normal TI - Negative QuantiFERON gold, hepatitis B (HBsAb-; needs vaccination) - Discharged home on mesalamine 2.4 g bid  Repeat labs on 12/12/2022 reviewed and H/H stable at 12.2/38.4, WBC normalized to 8.8.  Normal liver enzymes.  No new imaging since hospital  discharge.  Was diagnosed with RLE DVT in September, and started on Xarelto. Had f/u in the Hematology Clinic on 10/8 with plan for Xarelto x3 months for provoked DVT.   Insurance denied Rinvoq. Had applied for patient assistance, but that has been put on hold.   He has since tapered off steroids, with last dose >1 month ago.   Today, he states he feels much better since hospital discharge.  tolerating Lialda 2.4 g bid without issue. Started a probiotic and a "Crohns and Colitis supplement". Back to baseline bowel habits; 1-2 BM/day. No nocturnal stools. No longer with hematochezia. Good appetite, no abdominal pain.  Weight stable, no fever.  Diabetes has been better controlled since coming off the steroids.  Review of systems:     No chest pain, no SOB, no fevers, no urinary sx   Past Medical History:  Diagnosis Date   Arthritis    Crohn's colitis, with rectal bleeding (HCC) 10/22/2022   Diabetes mellitus type II    DIABETES MELLITUS, TYPE II 08/18/2009   on meds   Hay fever    with allergies   Hyperlipidemia    HYPERLIPIDEMIA 08/18/2009   on meds   Seasonal allergies     Patient's surgical history, family medical history, social history, medications and allergies were all reviewed in Epic    Current Outpatient Medications  Medication Sig Dispense Refill   Blood Glucose Monitoring Suppl DEVI 1 each by Does not apply route 3 (three) times daily. May dispense any manufacturer covered by patient's insurance. 1 each 0   Continuous Glucose Sensor (DEXCOM G7 SENSOR) MISC Dexcom glucose monitoring sensor 3 each 3   empagliflozin (JARDIANCE) 10 MG TABS tablet Take 1 tablet (10  mg total) by mouth daily before breakfast. 30 tablet 11   glucose blood (CONTOUR TEST) test strip Use as instructed 250 each 0   insulin aspart (NOVOLOG) 100 UNIT/ML FlexPen Inject 0-6 Units into the skin 3 (three) times daily with meals. Check Blood Glucose (BG) and inject per scale: BG <150= 0 unit; BG 150-200= 1  unit; BG 201-250= 2 unit; BG 251-300= 3 unit; BG 301-350= 4 unit; BG 351-400= 5 unit; BG >400= 6 unit and Call Primary Care. 15 mL 0   Insulin Glargine (BASAGLAR KWIKPEN) 100 UNIT/ML Inject 8 Units into the skin 2 (two) times daily. May substitute as needed per insurance. 15 mL 0   Insulin Pen Needle (PEN NEEDLES) 31G X 5 MM MISC 1 each by Does not apply route 3 (three) times daily. May dispense any manufacturer covered by patient's insurance. 100 each 0   Lancet Device MISC 1 each by Does not apply route 3 (three) times daily. May dispense any manufacturer covered by patient's insurance. 1 each 0   Lancets MISC 1 each by Does not apply route 3 (three) times daily. Use as directed to check blood sugar. May dispense any manufacturer covered by patient's insurance and fits patient's device. 100 each 0   mesalamine (LIALDA) 1.2 g EC tablet TAKE 2 TABLETS (2.4 G TOTAL) BY MOUTH 2 (TWO) TIMES DAILY WITH A MEAL. 120 tablet 0   Multiple Vitamin (ONE-A-DAY MENS PO) Take 1 tablet by mouth daily.     rivaroxaban (XARELTO) 20 MG TABS tablet Take 1 tablet (20 mg total) by mouth daily with supper. Take with food. Start taking after completion of starter pack. 30 tablet 5   RIVAROXABAN (XARELTO) VTE STARTER PACK (15 & 20 MG) Follow package directions: Take one 15mg  tablet by mouth twice a day for the first 21 days. On day 22, switch to one 20mg  tablet once a day. Take with food. 51 each 0   sildenafil (VIAGRA) 100 MG tablet Take 0.5-1 tablets (50-100 mg total) by mouth daily as needed for erectile dysfunction. 10 tablet 11   simvastatin (ZOCOR) 80 MG tablet TAKE 1/2 TABLET BY MOUTH EVERY DAY 45 tablet 3   TRULICITY 0.75 MG/0.5ML SOPN Inject 0.75 mg into the skin once a week. (Patient not taking: Reported on 12/21/2022)     No current facility-administered medications for this visit.    Physical Exam:     There were no vitals taken for this visit.  GENERAL:  Pleasant male in NAD PSYCH: : Cooperative, normal  affect CARDIAC:  RRR, no murmur heard, no peripheral edema PULM: Normal respiratory effort, lungs CTA bilaterally, no wheezing ABDOMEN:  Nondistended, soft, nontender. No obvious masses, no hepatomegaly,  normal bowel sounds NEURO: Alert and oriented x 3, no focal neurologic deficits   IMPRESSION and PLAN:    1) Crohn's Colitis 52 yo male with steroid responsive Crohn's Colitis.  Index colonoscopy for CRC screening in 01/2021 with patchy moderate inflammation and aphthous ulcers in the right colon and ICV with biopsy showing acute colitis with chronic inflammatory change.  Was otherwise asymptomatic and patient elected for conservative management.  Unfortunately developed abrupt onset symptoms in 10/2022, and hospitalized with severe colitis.  Robust response to steroids and Lialda.  Has since titrated off steroids and now seemingly in clinical remission on Lialda monotherapy (along with 2 OTC supplements).  We discussed the pathophysiology of Crohn's Disease at length today, to include natural course of disease and medication options.  Had previously made preparations  for biologic therapy given severe disease and need for hospitalization and IV steroids.  However, given his swift clinical response and now feeling back to baseline, he would like to hold off on escalation of therapy in favor of continued mesalamine.  Plan as follows:  - Continue Lialda 4.8 g daily for the time being.  May consider reducing to 2.4 g daily at follow-up provided he is still in clinical remission - Check ESR, CRP, fecal calprotectin today as surrogate marker for response to therapy - Check CBC and BMP - Plan for DEXA scan in 6 months - Provided he is still in clinical remission, plan for colonoscopy in 6+ months to evaluate for deep remission - Will place hold on patient assistance application for Rinvoq for the time being  2) RLE DVT 3) Chronic anticoagulation - Continue Xarelto x 3 months for provoked DVT per  Hematologist - Has not had any hematochezia since initiation of anticoagulation  4) Family history of colon cancer 5) Personal history of tubular adenomas - Interval for repeat colonoscopy now more so dictated by his colitis rather than his personal and family history, but will certainly perform surveillance at time of repeat colonoscopy as above  RTC in 3 months or sooner as needed     I spent 40 minutes of time, including in depth chart review, independent review of results as outlined above, communicating results with the patient directly, face-to-face time with the patient, coordinating care, ordering studies and medications as appropriate, and documentation.   Shellia Cleverly ,DO, FACG 02/02/2023, 1:18 PM

## 2023-02-03 DIAGNOSIS — K5010 Crohn's disease of large intestine without complications: Secondary | ICD-10-CM | POA: Diagnosis not present

## 2023-02-04 ENCOUNTER — Other Ambulatory Visit: Payer: Self-pay | Admitting: Family Medicine

## 2023-02-08 LAB — CALPROTECTIN, FECAL: Calprotectin, Fecal: 193 ug/g — ABNORMAL HIGH (ref 0–120)

## 2023-02-15 ENCOUNTER — Encounter: Payer: Self-pay | Admitting: Gastroenterology

## 2023-02-19 ENCOUNTER — Other Ambulatory Visit: Payer: Self-pay | Admitting: Gastroenterology

## 2023-02-22 ENCOUNTER — Ambulatory Visit (INDEPENDENT_AMBULATORY_CARE_PROVIDER_SITE_OTHER): Payer: BC Managed Care – PPO | Admitting: Family Medicine

## 2023-02-22 VITALS — BP 118/82 | HR 91 | Temp 98.5°F | Ht 70.0 in | Wt 216.5 lb

## 2023-02-22 DIAGNOSIS — E1165 Type 2 diabetes mellitus with hyperglycemia: Secondary | ICD-10-CM | POA: Diagnosis not present

## 2023-02-22 DIAGNOSIS — E1169 Type 2 diabetes mellitus with other specified complication: Secondary | ICD-10-CM

## 2023-02-22 DIAGNOSIS — I82411 Acute embolism and thrombosis of right femoral vein: Secondary | ICD-10-CM

## 2023-02-22 DIAGNOSIS — E785 Hyperlipidemia, unspecified: Secondary | ICD-10-CM

## 2023-02-22 DIAGNOSIS — L259 Unspecified contact dermatitis, unspecified cause: Secondary | ICD-10-CM

## 2023-02-22 DIAGNOSIS — Z7984 Long term (current) use of oral hypoglycemic drugs: Secondary | ICD-10-CM

## 2023-02-22 LAB — COMPREHENSIVE METABOLIC PANEL
ALT: 17 U/L (ref 0–53)
AST: 17 U/L (ref 0–37)
Albumin: 4.3 g/dL (ref 3.5–5.2)
Alkaline Phosphatase: 63 U/L (ref 39–117)
BUN: 15 mg/dL (ref 6–23)
CO2: 29 meq/L (ref 19–32)
Calcium: 9.7 mg/dL (ref 8.4–10.5)
Chloride: 103 meq/L (ref 96–112)
Creatinine, Ser: 0.94 mg/dL (ref 0.40–1.50)
GFR: 94.19 mL/min (ref >60.00)
Glucose, Bld: 160 mg/dL — ABNORMAL HIGH (ref 70–99)
Potassium: 4.2 meq/L (ref 3.5–5.1)
Sodium: 140 meq/L (ref 135–145)
Total Bilirubin: 0.5 mg/dL (ref 0.2–1.2)
Total Protein: 7 g/dL (ref 6.0–8.3)

## 2023-02-22 LAB — POCT GLYCOSYLATED HEMOGLOBIN (HGB A1C): Hemoglobin A1C: 9 % — AB (ref 4.0–5.6)

## 2023-02-22 LAB — LIPID PANEL
Cholesterol: 193 mg/dL (ref 0–200)
HDL: 51.7 mg/dL (ref >39.00)
LDL Cholesterol: 103 mg/dL — ABNORMAL HIGH (ref 0–99)
NonHDL: 141.6
Total CHOL/HDL Ratio: 4
Triglycerides: 192 mg/dL — ABNORMAL HIGH (ref 0.0–149.0)
VLDL: 38.4 mg/dL (ref 0.0–40.0)

## 2023-02-22 MED ORDER — METHYLPREDNISOLONE ACETATE 80 MG/ML IJ SUSP
80.0000 mg | Freq: Once | INTRAMUSCULAR | Status: AC
Start: 2023-02-22 — End: 2023-02-22
  Administered 2023-02-22: 80 mg via INTRAMUSCULAR

## 2023-02-22 NOTE — Patient Instructions (Signed)
A1C today is 9.0-   Start back the Trulicity 0.75 mg once weekly  Continue Jardiance and insulin.    Set up 3 month follow up.

## 2023-02-22 NOTE — Progress Notes (Signed)
Established Patient Office Visit  Subjective   Patient ID: Richard Carroll, male    DOB: 1971-01-23  Age: 52 y.o. MRN: [id number]  Chief Complaint  Patient presents with   Medical Management of Chronic Issues    HPI   Richard Carroll is seen for medical follow-up.  He has new acute issue of facial swelling and itching and rash.  He started a new type of soap this past Friday called Nivea about Friday night noticed some rash on his chest and face.  He had some swelling of the eyelids since then and pruritus.  No visual changes.  Type 2 diabetes.  Diagnosed with Crohn's disease this past summer.  Has been gradually improving and stabilizing from that on mesalamine.  No recent diarrhea.  Recent fecal calprotectin 193 down from initial level of 2610.  He is currently on Jardiance 10 mg daily Basaglar 10 mg twice daily, and sliding scale NovoLog.  Previously did well with GLP-1 medication.  Had also previously been on glimepiride and metformin.  He lost tremendous amount of weight with his Crohn's and we have backed off several of his medications.  He has now been gaining weight back and blood sugars have been going up concomitantly.  His last A1c was actually 12 down to 9 today  DVT right calf back in September.  Initial DVT event.  Currently on Xarelto with recommended 86-month minimum duration.  No known family history of thrombosis.  For lipidemia treated with simvastatin 40 mg daily.  Needs follow-up lipid panel.  Tolerating well with no myalgias.  Past Medical History:  Diagnosis Date   Arthritis    Crohn's colitis, with rectal bleeding (HCC) 10/22/2022   Diabetes mellitus type II    DIABETES MELLITUS, TYPE II 08/18/2009   on meds   Hay fever    with allergies   Hyperlipidemia    HYPERLIPIDEMIA 08/18/2009   on meds   Seasonal allergies    Past Surgical History:  Procedure Laterality Date   BIOPSY  10/25/2022   Procedure: BIOPSY;  Surgeon: Meryl Dare, MD;  Location: [facility];   Service: Gastroenterology;;   COLONOSCOPY  01/2021   COLONOSCOPY WITH PROPOFOL N/A 10/25/2022   Procedure: COLONOSCOPY WITH PROPOFOL;  Surgeon: Meryl Dare, MD;  Location: [facility];  Service: Gastroenterology;  Laterality: N/A;   FINGER SURGERY Right    ring finger   KNEE SURGERY Right 2005    reports that he has never smoked. His smokeless tobacco use includes chew. He reports that he does not currently use alcohol. He reports that he does not use drugs. family history includes Alcohol abuse in his maternal grandfather; Colon cancer in his paternal uncle; Colon cancer (age of onset: 68) in his father; Diabetes in his maternal grandmother and mother; Heart disease in his father; Hyperlipidemia in his mother; Hypertension in his mother; Prostate cancer in his paternal uncle; Prostate cancer (age of onset: 60) in his father; Stroke in his mother. No Known Allergies  Review of Systems  Constitutional:  Negative for chills, fever and malaise/fatigue.  Eyes:  Negative for blurred vision.  Respiratory:  Negative for shortness of breath.   Cardiovascular:  Negative for chest pain.  Gastrointestinal:  Negative for abdominal pain and diarrhea.  Neurological:  Negative for dizziness, weakness and headaches.      Objective:     BP 118/82 (BP Location: Left Arm, Patient Position: Sitting, Cuff Size: Normal)   Pulse 91   Temp 98.5 F (36.9  C) (Oral)   Ht 5\' 10"  (1.778 m)   Wt 216 lb 8 oz (98.2 kg)   SpO2 96%   BMI 31.06 kg/m  BP Readings from Last 3 Encounters:  02/22/23 118/82  02/02/23 122/76  01/11/23 115/68   Wt Readings from Last 3 Encounters:  02/22/23 216 lb 8 oz (98.2 kg)  02/02/23 216 lb (98 kg)  01/11/23 208 lb 7 oz (94.5 kg)      Physical Exam Vitals reviewed.  Constitutional:      Appearance: He is well-developed.  HENT:     Right Ear: External ear normal.     Left Ear: External ear normal.  Eyes:     Pupils: Pupils are equal, round, and reactive to light.   Neck:     Thyroid: No thyromegaly.  Cardiovascular:     Rate and Rhythm: Normal rate and regular rhythm.  Pulmonary:     Effort: Pulmonary effort is normal. No respiratory distress.     Breath sounds: Normal breath sounds. No wheezing or rales.  Musculoskeletal:     Cervical back: Neck supple.  Skin:    Findings: Rash present.     Comments: He has significant swelling and mild erythema diffusely involving the face especially eyelids and malar region.  Neurological:     Mental Status: He is alert and oriented to person, place, and time.      Results for orders placed or performed in visit on 02/22/23  POC HgB A1c  Result Value Ref Range   Hemoglobin A1C 9.0 (A) 4.0 - 5.6 %   HbA1c POC (<> result, manual entry)     HbA1c, POC (prediabetic range)     HbA1c, POC (controlled diabetic range)      Last CBC Lab Results  Component Value Date   WBC 7.7 02/02/2023   HGB 15.6 02/02/2023   HCT 48.7 02/02/2023   MCV 92.0 02/02/2023   MCH 30.8 12/12/2022   RDW 13.6 02/02/2023   PLT 256.0 02/02/2023   Last metabolic panel Lab Results  Component Value Date   GLUCOSE 167 (H) 02/02/2023   NA 140 02/02/2023   K 4.0 02/02/2023   CL 104 02/02/2023   CO2 27 02/02/2023   BUN 13 02/02/2023   CREATININE 0.95 02/02/2023   GFR 93.04 02/02/2023   CALCIUM 9.1 02/02/2023   PHOS 3.1 10/27/2022   PROT 6.1 (L) 12/12/2022   ALBUMIN 3.1 (L) 12/12/2022   BILITOT 0.3 12/12/2022   ALKPHOS 59 12/12/2022   AST 22 12/12/2022   ALT 28 12/12/2022   ANIONGAP 9 12/12/2022   Last lipids Lab Results  Component Value Date   CHOL 142 01/05/2022   HDL 47.70 01/05/2022   LDLCALC 73 01/05/2022   LDLDIRECT 113.0 01/13/2018   TRIG 106.0 01/05/2022   CHOLHDL 3 01/05/2022   Last hemoglobin A1c Lab Results  Component Value Date   HGBA1C 9.0 (A) 02/22/2023   Last thyroid functions No results found for: "TSH", "T3TOTAL", "T4TOTAL", "THYROIDAB"    The 10-year ASCVD risk score (Arnett DK, et al.,  2019) is: 4.2%    Assessment & Plan:   #1 type 2 diabetes suboptimally controlled with A1c 9.0% today.  Patient currently on Jardiance and Basaglar and NovoLog sliding scale with meals. Start back Trulicity 0.75 mg subcutaneous once weekly.  He has some supplies still at home.  Reassess in 3 months.  If not to goal at that point either titrate Trulicity or possibly consider change to [facility]. Had previously been  on glimepiride and metformin will try to hold these Hopefully we can taper down his insulin over time as he gets back on GLP-1 medication  #2 hyperlipidemia treated with simvastatin.  Recheck lipid and CMP today  #3 history of acute DVT right calf in September.  Continue Xarelto for minimum 3 months duration.  #4 Crohn's disease improved on mesalamine.  There had been initial consideration for Rinvoq but he has done extremely well with current regimen and recent fecal calprotectin down to 193.  Continue close GI follow-up.  #5 contact dermatitis type rash involving the face following new type of soap Friday.  We discussed possible steroid with Depo-Medrol knowing that this will likely exacerbate his blood sugar short-term over the next week.  He does have NovoLog sliding scale insulin to help cover and we will monitor this closely with his Dexcom.  Depo-Medrol 80 mg IM given   Return in about 3 months (around 05/25/2023).    Evelena Peat, MD

## 2023-02-22 NOTE — Addendum Note (Signed)
Addended by: Christy Sartorius on: 02/22/2023 09:10 AM   Modules accepted: Orders

## 2023-02-23 MED ORDER — ROSUVASTATIN CALCIUM 20 MG PO TABS: 20.0000 mg | ORAL_TABLET | Freq: Every day | ORAL | 0 refills | Status: DC

## 2023-02-23 NOTE — Addendum Note (Signed)
Addended by: Christy Sartorius on: 02/23/2023 08:48 AM   Modules accepted: Orders

## 2023-03-08 DIAGNOSIS — H1031 Unspecified acute conjunctivitis, right eye: Secondary | ICD-10-CM | POA: Diagnosis not present

## 2023-03-08 DIAGNOSIS — H05011 Cellulitis of right orbit: Secondary | ICD-10-CM | POA: Diagnosis not present

## 2023-03-10 ENCOUNTER — Other Ambulatory Visit: Payer: Self-pay | Admitting: Family Medicine

## 2023-04-20 ENCOUNTER — Encounter: Payer: Self-pay | Admitting: Family Medicine

## 2023-04-22 ENCOUNTER — Telehealth: Payer: Self-pay | Admitting: Family Medicine

## 2023-04-22 NOTE — Telephone Encounter (Unsigned)
Copied from CRM [id number]. Topic: General - Other >> Apr 22, 2023 11:40 AM Irine Seal wrote: Reason for CRM: Pt is needing a dismissal letter for jury duty due to his phone that monitors his dexcom   not being permitted in the courtroom.

## 2023-04-23 ENCOUNTER — Encounter: Payer: Self-pay | Admitting: Family Medicine

## 2023-04-25 NOTE — Telephone Encounter (Signed)
Patient notified via Mychart.

## 2023-05-01 ENCOUNTER — Other Ambulatory Visit: Payer: Self-pay | Admitting: Family Medicine

## 2023-05-10 ENCOUNTER — Encounter: Payer: Self-pay | Admitting: Family Medicine

## 2023-05-11 ENCOUNTER — Other Ambulatory Visit: Payer: Self-pay | Admitting: Family Medicine

## 2023-05-11 MED ORDER — TRULICITY 0.75 MG/0.5ML ~~LOC~~ SOAJ: 0.7500 mg | SUBCUTANEOUS | 2 refills | Status: DC

## 2023-05-11 MED ORDER — BASAGLAR KWIKPEN 100 UNIT/ML ~~LOC~~ SOPN: 8.0000 [IU] | PEN_INJECTOR | Freq: Two times a day (BID) | SUBCUTANEOUS | 2 refills | Status: DC

## 2023-05-11 MED ORDER — INSULIN ASPART 100 UNIT/ML FLEXPEN: 0.0000 [IU] | PEN_INJECTOR | Freq: Three times a day (TID) | SUBCUTANEOUS | 2 refills | Status: DC

## 2023-05-20 ENCOUNTER — Other Ambulatory Visit: Payer: Self-pay | Admitting: Family Medicine

## 2023-05-21 ENCOUNTER — Other Ambulatory Visit: Payer: Self-pay | Admitting: Family Medicine

## 2023-05-24 ENCOUNTER — Encounter: Payer: Self-pay | Admitting: Family Medicine

## 2023-05-24 ENCOUNTER — Ambulatory Visit: Payer: BC Managed Care – PPO | Admitting: Family Medicine

## 2023-05-24 VITALS — BP 136/82 | HR 75 | Temp 98.0°F | Ht 70.0 in | Wt 216.8 lb

## 2023-05-24 DIAGNOSIS — E1165 Type 2 diabetes mellitus with hyperglycemia: Secondary | ICD-10-CM | POA: Diagnosis not present

## 2023-05-24 DIAGNOSIS — E785 Hyperlipidemia, unspecified: Secondary | ICD-10-CM

## 2023-05-24 DIAGNOSIS — E1169 Type 2 diabetes mellitus with other specified complication: Secondary | ICD-10-CM | POA: Diagnosis not present

## 2023-05-24 LAB — LIPID PANEL
Cholesterol: 121 mg/dL (ref 0–200)
HDL: 45.7 mg/dL (ref >39.00)
LDL Cholesterol: 61 mg/dL (ref 0–99)
NonHDL: 74.89
Total CHOL/HDL Ratio: 3
Triglycerides: 69 mg/dL (ref 0.0–149.0)
VLDL: 13.8 mg/dL (ref 0.0–40.0)

## 2023-05-24 LAB — MICROALBUMIN / CREATININE URINE RATIO
Creatinine,U: 108.3 mg/dL
Microalb Creat Ratio: 8.7 mg/g (ref 0.0–30.0)
Microalb, Ur: 0.9 mg/dL (ref 0.0–1.9)

## 2023-05-24 LAB — COMPREHENSIVE METABOLIC PANEL
ALT: 30 U/L (ref 0–53)
AST: 20 U/L (ref 0–37)
Albumin: 4.2 g/dL (ref 3.5–5.2)
Alkaline Phosphatase: 65 U/L (ref 39–117)
BUN: 15 mg/dL (ref 6–23)
CO2: 30 meq/L (ref 19–32)
Calcium: 8.8 mg/dL (ref 8.4–10.5)
Chloride: 102 meq/L (ref 96–112)
Creatinine, Ser: 0.9 mg/dL (ref 0.40–1.50)
GFR: 99.07 mL/min (ref >60.00)
Glucose, Bld: 188 mg/dL — ABNORMAL HIGH (ref 70–99)
Potassium: 4.4 meq/L (ref 3.5–5.1)
Sodium: 139 meq/L (ref 135–145)
Total Bilirubin: 0.5 mg/dL (ref 0.2–1.2)
Total Protein: 6.8 g/dL (ref 6.0–8.3)

## 2023-05-24 LAB — HEMOGLOBIN A1C: Hgb A1c MFr Bld: 7.8 % — ABNORMAL HIGH (ref 4.6–6.5)

## 2023-05-24 NOTE — Progress Notes (Signed)
 Established Patient Office Visit  Subjective   Patient ID: Richard Carroll, male    DOB: [birth year]  Age: 53 y.o. MRN: [id number]  Chief Complaint  Patient presents with   Medical Management of Chronic Issues     3 Month follow-up, for DM, BG- 227(this morning), weight gain     HPI   [name] is seen for medical follow-up.  He has history of Crohn's colitis, type 2 diabetes, dyslipidemia, obesity.  He had DVT last fall and took Xarelto for 3 months.  Came off this in December.  He remains on mesalamine for his Crohn's disease.  He has follow-up with GI tomorrow.  Symptomatically stable.  Type 2 diabetes with poor control last visit.  We started back Trulicity 0.75 mg subcutaneous once weekly and tolerating well.  He also remains on Jardiance, Basaglar, and NovoLog sliding scale with meals.  He missed his insulin last night.  Hyperlipidemia.  Had been on simvastatin.  LDL cholesterol 103 back in the fall and we switched him over to rosuvastatin 20 mg daily.  Tolerating well.  Needs follow-up lipids today.  Has not had dilated eye exam in over a year.  No recent foot problems.  Past Medical History:  Diagnosis Date   Arthritis    Crohn's colitis, with rectal bleeding (HCC) 10/22/2022   Diabetes mellitus type II    DIABETES MELLITUS, TYPE II 08/18/2009   on meds   Hay fever    with allergies   Hyperlipidemia    HYPERLIPIDEMIA 08/18/2009   on meds   Seasonal allergies    Past Surgical History:  Procedure Laterality Date   BIOPSY  10/25/2022   Procedure: BIOPSY;  Surgeon: Meryl Dare, MD;  Location: [facility];  Service: Gastroenterology;;   COLONOSCOPY  01/2021   COLONOSCOPY WITH PROPOFOL N/A 10/25/2022   Procedure: COLONOSCOPY WITH PROPOFOL;  Surgeon: Meryl Dare, MD;  Location: [facility];  Service: Gastroenterology;  Laterality: N/A;   FINGER SURGERY Right    ring finger   KNEE SURGERY Right 2005    reports that he has never smoked. His smokeless tobacco  use includes chew. He reports that he does not currently use alcohol. He reports that he does not use drugs. family history includes Alcohol abuse in his maternal grandfather; Colon cancer in his paternal uncle; Colon cancer (age of onset: 83) in his father; Diabetes in his maternal grandmother and mother; Heart disease in his father; Hyperlipidemia in his mother; Hypertension in his mother; Prostate cancer in his paternal uncle; Prostate cancer (age of onset: 63) in his father; Stroke in his mother. No Known Allergies  Review of Systems  Constitutional:  Negative for malaise/fatigue.  Eyes:  Negative for blurred vision.  Respiratory:  Negative for shortness of breath.   Cardiovascular:  Negative for chest pain.  Neurological:  Negative for dizziness, weakness and headaches.      Objective:     BP 136/82 (BP Location: Left Arm, Patient Position: Sitting, Cuff Size: Large)   Pulse 75   Temp 98 F (36.7 C) (Oral)   Ht 5\' 10"  (1.778 m)   Wt 216 lb 12.8 oz (98.3 kg)   SpO2 98%   BMI 31.11 kg/m  BP Readings from Last 3 Encounters:  05/24/23 136/82  02/22/23 118/82  02/02/23 122/76   Wt Readings from Last 3 Encounters:  05/24/23 216 lb 12.8 oz (98.3 kg)  02/22/23 216 lb 8 oz (98.2 kg)  02/02/23 216 lb (98 kg)  Physical Exam Constitutional:      Appearance: He is well-developed.  HENT:     Right Ear: External ear normal.     Left Ear: External ear normal.  Eyes:     Pupils: Pupils are equal, round, and reactive to light.  Neck:     Thyroid: No thyromegaly.  Cardiovascular:     Rate and Rhythm: Normal rate and regular rhythm.  Pulmonary:     Effort: Pulmonary effort is normal. No respiratory distress.     Breath sounds: Normal breath sounds. No wheezing or rales.  Musculoskeletal:     Cervical back: Neck supple.  Skin:    Comments: Feet reveal no calluses.  No lesions.  Good dorsalis pedis pulses.  Normal sensory function throughout with monofilament testing   Neurological:     Mental Status: He is alert and oriented to person, place, and time.      No results found for any visits on 05/24/23.    The 10-year ASCVD risk score (Arnett DK, et al., 2019) is: 7.4%    Assessment & Plan:   #1 type 2 diabetes with recent poor control.  Now back on Trulicity along with Jardiance and Basaglar with sliding scale NovoLog.  Looking at his Dexcom readings he still had about 50% of his readings out of goal range.  Recheck A1c today.  Check urine microalbumin screen.  Set up diabetic eye exam.  If A1c still not to goal we will either increase Trulicity or consider more potent GLP-1 such as Mounjaro.  Discussed diet in some detail.  He is not eating a lot at meals but is still eating high glycemic foods such as peanut butter and jelly sandwiches at lunch with potato chips.  We made some suggestions for trying to reduce sugar and starch  #2 dyslipidemia.  Patient switched recently to rosuvastatin 20 mg daily and tolerating well.  Check lipid and CMP today  #3 history of Crohn's colitis.  Stable on mesalamine.  Continue close follow-up with GI.  Return in about 3 months (around 08/21/2023).    Evelena Peat, MD

## 2023-05-25 ENCOUNTER — Ambulatory Visit: Payer: BC Managed Care – PPO | Admitting: Gastroenterology

## 2023-05-26 MED ORDER — TRULICITY 1.5 MG/0.5ML ~~LOC~~ SOAJ: 1.5000 mg | SUBCUTANEOUS | 0 refills | Status: DC

## 2023-05-26 NOTE — Addendum Note (Signed)
 Addended by: Christy Sartorius on: 05/26/2023 11:17 AM   Modules accepted: Orders

## 2023-05-30 ENCOUNTER — Encounter: Payer: Self-pay | Admitting: Family Medicine

## 2023-05-30 MED ORDER — DOXYCYCLINE HYCLATE 100 MG PO TABS: 100.0000 mg | ORAL_TABLET | Freq: Two times a day (BID) | ORAL | 0 refills | Status: AC

## 2023-06-07 ENCOUNTER — Encounter: Payer: Self-pay | Admitting: Family Medicine

## 2023-06-07 ENCOUNTER — Ambulatory Visit (INDEPENDENT_AMBULATORY_CARE_PROVIDER_SITE_OTHER): Payer: BC Managed Care – PPO | Admitting: Family Medicine

## 2023-06-07 VITALS — BP 132/80 | HR 86 | Temp 98.0°F | Ht 70.0 in | Wt 216.2 lb

## 2023-06-07 DIAGNOSIS — L02412 Cutaneous abscess of left axilla: Secondary | ICD-10-CM

## 2023-06-07 NOTE — Patient Instructions (Signed)
 Consider daily use of anti-bacterial soap such as Dial or Lever 2000.

## 2023-06-07 NOTE — Progress Notes (Signed)
 Established Patient Office Visit  Subjective   Patient ID: Richard Carroll, male    DOB: 1971/02/05  Age: 53 y.o. MRN: [id number]  Chief Complaint  Patient presents with   Follow-up    HPI   Richard Carroll seen for follow-up regarding recent left axillary abscess.  They sent in a patient message with picture on 05-30-2023 with what appear to be abscess of the left axillary region.  He had prior history of abscess in the groin region.  This look like it had come to ahead.  He had been unable to get in for an appointment that time.  We had agreed to send in some doxycycline which he started on twice daily and just recently finished.  He applied topical heat.  This did eventually rupture and has been draining a little bit of pus since then.  No fever.  He initially had bandage with latex and also some Neosporin and had some allergic reaction to the bandage.  Now has a latex free bandage  Generally uses Dove soap.  Past Medical History:  Diagnosis Date   Arthritis    Crohn's colitis, with rectal bleeding (HCC) 10/22/2022   Diabetes mellitus type II    DIABETES MELLITUS, TYPE II 08/18/2009   on meds   Hay fever    with allergies   Hyperlipidemia    HYPERLIPIDEMIA 08/18/2009   on meds   Seasonal allergies    Past Surgical History:  Procedure Laterality Date   BIOPSY  10/25/2022   Procedure: BIOPSY;  Surgeon: Meryl Dare, MD;  Location: [facility];  Service: Gastroenterology;;   COLONOSCOPY  01/2021   COLONOSCOPY WITH PROPOFOL N/A 10/25/2022   Procedure: COLONOSCOPY WITH PROPOFOL;  Surgeon: Meryl Dare, MD;  Location: [facility];  Service: Gastroenterology;  Laterality: N/A;   FINGER SURGERY Right    ring finger   KNEE SURGERY Right 2005    reports that he has never smoked. His smokeless tobacco use includes chew. He reports that he does not currently use alcohol. He reports that he does not use drugs. family history includes Alcohol abuse in his maternal grandfather; Colon  cancer in his paternal uncle; Colon cancer (age of onset: 72) in his father; Diabetes in his maternal grandmother and mother; Heart disease in his father; Hyperlipidemia in his mother; Hypertension in his mother; Prostate cancer in his paternal uncle; Prostate cancer (age of onset: 57) in his father; Stroke in his mother. No Known Allergies  Review of Systems  Constitutional:  Negative for chills and fever.      Objective:     BP 132/80   Pulse 86   Temp 98 F (36.7 C) (Oral)   Ht 5\' 10"  (1.778 m)   Wt 216 lb 3.2 oz (98.1 kg)   SpO2 96%   BMI 31.02 kg/m  BP Readings from Last 3 Encounters:  06/07/23 132/80  05/24/23 136/82  02/22/23 118/82   Wt Readings from Last 3 Encounters:  06/07/23 216 lb 3.2 oz (98.1 kg)  05/24/23 216 lb 12.8 oz (98.3 kg)  02/22/23 216 lb 8 oz (98.2 kg)      Physical Exam Vitals reviewed.  Constitutional:      General: He is not in acute distress.    Appearance: He is not ill-appearing.  Cardiovascular:     Rate and Rhythm: Normal rate and regular rhythm.  Pulmonary:     Effort: Pulmonary effort is normal.     Breath sounds: Normal breath sounds.  Skin:  Comments: Axillary region reveals open wound approximately 1/2 x 1 cm.  This corresponds with location of recent abscess.  Good granulation tissue.  No visible purulence.  No surrounding cellulitis changes.  Does have some very mild erythema consistent with recent contact dermatitis from latex bandage  Neurological:     Mental Status: He is alert.      No results found for any visits on 06/07/23.    The ASCVD Risk score (Arnett DK, et al., 2019) failed to calculate for the following reasons:   The valid total cholesterol range is 130 to 320 mg/dL    Assessment & Plan:   Recent left axillary abscess.  History of type 2 diabetes with recent A1c 7.8%.  Abscess ruptured spontaneously.  No indication for further I&D at this time.  No indication for packing.  He recently completed  doxycycline.  We have suggested antibacterial soap such as Dial or Lever 2000 and follow-up promptly for any recurrent abscess  Evelena Peat, MD

## 2023-06-08 MED ORDER — TRULICITY 1.5 MG/0.5ML ~~LOC~~ SOAJ: 1.5000 mg | SUBCUTANEOUS | 0 refills | Status: DC

## 2023-06-13 ENCOUNTER — Other Ambulatory Visit: Payer: Self-pay

## 2023-06-13 ENCOUNTER — Telehealth: Payer: Self-pay

## 2023-06-13 DIAGNOSIS — K5010 Crohn's disease of large intestine without complications: Secondary | ICD-10-CM

## 2023-06-13 NOTE — Telephone Encounter (Signed)
 Orders have been placed for patient to have Dexa Scan.  Left message for patient to return call to our office to notify the patient that he is due to have dexa scan.  No appointment is needed.  Can provide patient with number to Xray department [phone] for him to call and make them aware that he is coming.

## 2023-06-13 NOTE — Telephone Encounter (Signed)
-----   Message from [facility] [place] [facility] sent at 02/02/2023  2:05 PM EDT ----- Regarding: April 2025 Needs Dexa scan per [facility] dx crohns colitis

## 2023-06-24 ENCOUNTER — Ambulatory Visit (INDEPENDENT_AMBULATORY_CARE_PROVIDER_SITE_OTHER)
Admission: RE | Admit: 2023-06-24 | Discharge: 2023-06-24 | Disposition: A | Discharge status: Home / Self Care | Source: Ambulatory Visit | Attending: Gastroenterology | Admitting: Gastroenterology

## 2023-06-24 DIAGNOSIS — Z13820 Encounter for screening for osteoporosis: Secondary | ICD-10-CM | POA: Diagnosis not present

## 2023-06-24 DIAGNOSIS — K5010 Crohn's disease of large intestine without complications: Secondary | ICD-10-CM | POA: Diagnosis not present

## 2023-06-27 ENCOUNTER — Encounter: Payer: Self-pay | Admitting: Gastroenterology

## 2023-07-01 ENCOUNTER — Encounter: Payer: Self-pay | Admitting: Family Medicine

## 2023-07-01 MED ORDER — DOXYCYCLINE HYCLATE 100 MG PO TABS: 100.0000 mg | ORAL_TABLET | Freq: Two times a day (BID) | ORAL | 0 refills | Status: AC

## 2023-07-15 ENCOUNTER — Other Ambulatory Visit: Payer: Self-pay | Admitting: Gastroenterology

## 2023-08-07 ENCOUNTER — Other Ambulatory Visit: Payer: Self-pay | Admitting: Family Medicine

## 2023-08-17 ENCOUNTER — Other Ambulatory Visit (INDEPENDENT_AMBULATORY_CARE_PROVIDER_SITE_OTHER)

## 2023-08-17 ENCOUNTER — Ambulatory Visit: Payer: Self-pay | Admitting: Gastroenterology

## 2023-08-17 ENCOUNTER — Ambulatory Visit (INDEPENDENT_AMBULATORY_CARE_PROVIDER_SITE_OTHER): Payer: BC Managed Care – PPO | Admitting: Gastroenterology

## 2023-08-17 ENCOUNTER — Encounter: Payer: Self-pay | Admitting: Gastroenterology

## 2023-08-17 DIAGNOSIS — R1084 Generalized abdominal pain: Secondary | ICD-10-CM | POA: Diagnosis not present

## 2023-08-17 DIAGNOSIS — Z86718 Personal history of other venous thrombosis and embolism: Secondary | ICD-10-CM

## 2023-08-17 DIAGNOSIS — K50119 Crohn's disease of large intestine with unspecified complications: Secondary | ICD-10-CM

## 2023-08-17 DIAGNOSIS — R195 Other fecal abnormalities: Secondary | ICD-10-CM | POA: Diagnosis not present

## 2023-08-17 DIAGNOSIS — Z800 Family history of malignant neoplasm of digestive organs: Secondary | ICD-10-CM

## 2023-08-17 DIAGNOSIS — R194 Change in bowel habit: Secondary | ICD-10-CM | POA: Diagnosis not present

## 2023-08-17 DIAGNOSIS — Z860101 Personal history of adenomatous and serrated colon polyps: Secondary | ICD-10-CM

## 2023-08-17 LAB — BASIC METABOLIC PANEL WITH GFR
BUN: 17 mg/dL (ref 6–23)
CO2: 29 meq/L (ref 19–32)
Calcium: 9.2 mg/dL (ref 8.4–10.5)
Chloride: 103 meq/L (ref 96–112)
Creatinine, Ser: 1.04 mg/dL (ref 0.40–1.50)
GFR: 83.15 mL/min (ref >60.00)
Glucose, Bld: 100 mg/dL — ABNORMAL HIGH (ref 70–99)
Potassium: 4.3 meq/L (ref 3.5–5.1)
Sodium: 139 meq/L (ref 135–145)

## 2023-08-17 LAB — CBC
HCT: 48.4 % (ref 39.0–52.0)
Hemoglobin: 16.3 g/dL (ref 13.0–17.0)
MCHC: 33.7 g/dL (ref 30.0–36.0)
MCV: 91.6 fl (ref 78.0–100.0)
Platelets: 246 10*3/uL (ref 150.0–400.0)
RBC: 5.28 Mil/uL (ref 4.22–5.81)
RDW: 13.2 % (ref 11.5–15.5)
WBC: 7.2 10*3/uL (ref 4.0–10.5)

## 2023-08-17 LAB — C-REACTIVE PROTEIN: CRP: <1 mg/dL (ref 0.5–20.0)

## 2023-08-17 LAB — SEDIMENTATION RATE: Sed Rate: 2 mm/h (ref 0–20)

## 2023-08-17 MED ORDER — NA SULFATE-K SULFATE-MG SULF 17.5-3.13-1.6 GM/177ML PO SOLN: 1.0000 | ORAL | 0 refills | Status: DC

## 2023-08-17 NOTE — Progress Notes (Signed)
 Chief Complaint:   Crohn's disease  GI History: 53 year old male with a history of diverticulosis (with acute diverticulitis in 12/2015), diabetes, hyperlipidemia. Family history notable for father with colon cancer at age  <68 and paternal uncle with colon cancer.  GI history as below:  - 01/30/2021: Colonoscopy for initial screening: 5 subcentimeter tubular adenomas, rectal polyp (path: HP), sigmoid diverticulosis.  Patchy moderate inflammation with aphthous ulcers in the right colon and ICV with mild inflammatory changes in the left colon (path: Active colitis without chronic inflammatory changes).  Normal rectum.  Grade 1 internal hemorrhoids.  Normal TI - 01/2021: Normal ESR and CRP.  Fecal calprotectin not completed - 03/2021: Follow-up in the GI clinic.  No GI symptoms at all.  Elected for conservative management - 10/2022: Hospital admission for bloody diarrhea.  Negative infectious workup.  CT with diffuse colitis from transverse colon through rectosigmoid.  Fecal calprotectin 2610.  Inpatient colonoscopy with Severe inflammation from rectum through hepatic flexure and at IC valve with erythema, friability, shallow ulcerations (path: Moderately active chronic colitis).  Normal TI.  Diagnosed with active Crohn's colitis, treated with IV Solu-Medrol  with good response and transitioned to prednisone  and discharged home with mesalamine  2.4 g bid. - 12/2022: RLE DVT, started on Xarelto  x 3 months.  Insurance denied Rinvoq - 02/02/2023: Follow-up in the GI clinic.  Off steroids and feeling well with Lialda  2.4 g bid.  Patient elected to hold off on escalation of therapy with repeat colonoscopy in 6+ months.  Repeat calprotectin 193.  CBC, ESR/CRP normal. - 06/2023: DEXA normal  HPI:     Patient is a 53 y.o. male presenting to the Gastroenterology Clinic for follow-up.  Last seen in the GI clinic 02/02/2023.  States he overall feels well. Will have intermittent abdominal pain and  watery stools.  Sxs can occur 2-3 times/week at most. No bloody stool. Pain tends to resolve with BM.  Will have 1-2 loose stools, then no symptoms for days.  Still taking Lialda  2.4 gm bid. No new medications, supplements, etc. He actually stopped taking his previous "Crohn's supplements".  No sick contacts, travel, dietary modifications.     Review of systems:     No chest pain, no SOB, no fevers, no urinary sx   Past Medical History:  Diagnosis Date   Arthritis    Crohn's colitis, with rectal bleeding (HCC) 10/22/2022   Diabetes mellitus type II    DIABETES MELLITUS, TYPE II 08/18/2009   on meds   Hay fever    with allergies   Hyperlipidemia    HYPERLIPIDEMIA 08/18/2009   on meds   Seasonal allergies     Patient's surgical history, family medical history, social history, medications and allergies were all reviewed in Epic    Current Outpatient Medications  Medication Sig Dispense Refill   Blood Glucose Monitoring Suppl DEVI 1 each by Does not apply route 3 (three) times daily. May dispense any manufacturer covered by patient's insurance. 1 each 0   Continuous Glucose Sensor (DEXCOM G7 SENSOR) MISC CHANGE SENSOR EVERY 10 DAYS 3 each 3   Dulaglutide  (TRULICITY ) 1.5 MG/0.5ML SOAJ Inject 1.5 mg into the skin once a week. 8 mL 0   empagliflozin  (JARDIANCE ) 10 MG TABS tablet Take 1 tablet (10 mg total) by mouth daily before breakfast. 30 tablet 11   glucose blood (CONTOUR TEST) test strip Use as instructed 250 each 0   insulin  aspart (NOVOLOG ) 100 UNIT/ML FlexPen Inject 0-6 Units into the skin 3 (three)  times daily with meals. Check Blood Glucose (BG) and inject per scale: BG <150= 0 unit; BG 150-200= 1 unit; BG 201-250= 2 unit; BG 251-300= 3 unit; BG 301-350= 4 unit; BG 351-400= 5 unit; BG >400= 6 unit and Call Primary Care. 15 mL 2   Insulin  Glargine-aglr (REZVOGLAR  KWIKPEN) 100 UNIT/ML SOPN INJECT 8 UNITS INTO THE SKIN 2 (TWO) TIMES DAILY. 15 mL 2   Insulin  Pen Needle (B-D UF III MINI PEN NEEDLES)  31G X 5 MM MISC 1 EACH BY DOES NOT APPLY ROUTE 3 (THREE) TIMES DAILY. MAY DISPENSE ANY MANUFACTURER COVERED BY PATIENT'S INSURANCE. 100 each 3   Lancet Device MISC 1 each by Does not apply route 3 (three) times daily. May dispense any manufacturer covered by patient's insurance. 1 each 0   Lancets MISC 1 each by Does not apply route 3 (three) times daily. Use as directed to check blood sugar. May dispense any manufacturer covered by patient's insurance and fits patient's device. 100 each 0   mesalamine  (LIALDA ) 1.2 g EC tablet TAKE 2 TABLETS (2.4 G TOTAL) BY MOUTH 2 (TWO) TIMES DAILY WITH A MEAL. 120 tablet 2   Multiple Vitamin (ONE-A-DAY MENS PO) Take 1 tablet by mouth daily.     rosuvastatin  (CRESTOR ) 20 MG tablet TAKE 1 TABLET BY MOUTH EVERY DAY 90 tablet 1   sildenafil  (VIAGRA ) 100 MG tablet Take 0.5-1 tablets (50-100 mg total) by mouth daily as needed for erectile dysfunction. 10 tablet 11   No current facility-administered medications for this visit.    Physical Exam:     BP 130/82   Pulse 88   Ht 5\' 10"  (1.778 m)   Wt 214 lb (97.1 kg)   BMI 30.71 kg/m   GENERAL:  Pleasant male in NAD PSYCH: : Cooperative, normal affect EENT:  conjunctiva pink, mucous membranes moist, neck supple without masses CARDIAC:  RRR, no murmur heard, no peripheral edema PULM: Normal respiratory effort, lungs CTA bilaterally, no wheezing ABDOMEN:  Nondistended, soft, nontender. No obvious masses, no hepatomegaly,  normal bowel sounds SKIN:  turgor, no lesions seen Musculoskeletal:  Normal muscle tone, normal strength NEURO: Alert and oriented x 3, no focal neurologic deficits   IMPRESSION and PLAN:    1) Crohn's Colitis 2) Change in bowel habits 53 yo male with steroid responsive Crohn's Colitis.  Index colonoscopy for CRC screening in 01/2021 with patchy moderate inflammation and aphthous ulcers in the right colon and ICV with biopsy showing acute colitis with chronic inflammatory change.  Was  otherwise asymptomatic and patient elected for conservative management.  Unfortunately developed abrupt onset symptoms in 10/2022, and hospitalized with severe colitis.  Robust response to steroids and Lialda  and has since weaned off steroids.  Intermittent generalized abdominal pain and nonbloody stools.  Discussed possibility of Crohn's flare vs infection vs IBS overlap.  - Check ESR, CRP, fecal calprotectin today - Check CBC, BMP - Check GI PCR panel - Resume Lialda  for the time being. Briefly discussed escalation of therapy depending on lab results - Due for repeat colonoscopy.  Will evaluate for active inflammation and assess response to therapy  3) Family history of colon cancer 4) Personal history of tubular adenomas - Repeat colonoscopy as above for ongoing surveillance  5) RLE DVT - Completed 3 months of Xarelto  for provoked DVT      The indications, risks, and benefits of colonoscopy were explained to the patient in detail. Risks include but are not limited to bleeding, perforation, adverse reaction to  medications, and cardiopulmonary compromise. Sequelae include but are not limited to the possibility of surgery, hospitalization, and mortality. The patient verbalized understanding and wished to proceed. All questions answered, referred to the scheduler and bowel prep ordered. Further recommendations pending results of the exam.      Annis Kinder ,DO, FACG 08/17/2023, 2:56 PM

## 2023-08-17 NOTE — Patient Instructions (Addendum)
 _______________________________________________________  If your blood pressure at your visit was 140/90 or greater, please contact your primary care physician to follow up on this.  If you are age 52 or younger, your body mass index should be between 19-25. Your Body mass index is 30.71 kg/m. If this is out of the aformentioned range listed, please consider follow up with your Primary Care Provider.  ________________________________________________________  The [facility] providers would like to encourage you to use MYCHART to communicate with providers for non-urgent requests or questions.  Due to long hold times on the telephone, sending your provider a message by [facility] may be a faster and more efficient way to get a response.  Please allow 48 business hours for a response.  Please remember that this is for non-urgent requests.  ________________________________________________________  Your provider has requested that you go to the basement level for lab work before leaving today. Press "B" on the elevator. The lab is located at the first door on the left as you exit the elevator.  You have been scheduled for a colonoscopy. Please follow written instructions given to you at your visit today.   If you use inhalers (even only as needed), please bring them with you on the day of your procedure.  DO NOT TAKE 7 DAYS PRIOR TO TEST- Trulicity  (dulaglutide ) Ozempic, Wegovy (semaglutide) Mounjaro (tirzepatide) Bydureon Bcise (exanatide extended release)  DO NOT TAKE 1 DAY PRIOR TO YOUR TEST Rybelsus (semaglutide) Adlyxin (lixisenatide) Victoza (liraglutide) Byetta (exanatide) ___________________________________________________________________________  Your provider has ordered "Diatherix" stool testing for you. You have received a kit from our office today containing all necessary supplies to complete this test. Please carefully read the stool collection instructions provided in the kit  before opening the accompanying materials. In addition, be sure there is a label providing your full name and date of birth on the "puritan opti-swab" tube that is supplied in the kit (if you do not see a label with this information on your test tube, please make us  aware before test collection!). After completing the test, you should secure the purtian tube into the specimen biohazard bag. The [facility] E-Req sheet (including date and time of specimen collection) should be placed into the outside pocket of the specimen biohazard bag and returned to the [facility] lab (basement floor of [facility]) within 3 days of collection. Please make sure to give the specimen to a staff member at the lab. DO NOT leave the specimen on the counter.  If the specimen date and time (can be found in the upper right boxed portion of the sheet) are not filled out on the E-Req sheet, the test will NOT be performed.    Due to recent changes in healthcare laws, you may see the results of your imaging and laboratory studies on MyChart before your provider has had a chance to review them.  We understand that in some cases there may be results that are confusing or concerning to you. Not all laboratory results come back in the same time frame and the provider may be waiting for multiple results in order to interpret others.  Please give us  48 hours in order for your provider to thoroughly review all the results before contacting the office for clarification of your results.   It was a pleasure to see you today!  Vito Cirigliano, D.O.

## 2023-08-18 ENCOUNTER — Other Ambulatory Visit

## 2023-08-18 ENCOUNTER — Other Ambulatory Visit: Payer: Self-pay | Admitting: Family Medicine

## 2023-08-18 DIAGNOSIS — K50119 Crohn's disease of large intestine with unspecified complications: Secondary | ICD-10-CM | POA: Diagnosis not present

## 2023-08-18 DIAGNOSIS — R195 Other fecal abnormalities: Secondary | ICD-10-CM | POA: Diagnosis not present

## 2023-08-18 DIAGNOSIS — R1084 Generalized abdominal pain: Secondary | ICD-10-CM

## 2023-08-22 ENCOUNTER — Telehealth: Payer: Self-pay

## 2023-08-22 NOTE — Telephone Encounter (Signed)
 Patient notified of negative Diatherix with no infection  per Dr Karene Oto.  Patient agreed to plan and verbalized understanding.  No further questions.

## 2023-08-22 NOTE — Telephone Encounter (Signed)
 Diatherix panel negative for infection.

## 2023-08-22 NOTE — Telephone Encounter (Signed)
 Received results of Diatherix stool test and placed in Dr Andre Kawasaki inbox for his review.

## 2023-08-23 ENCOUNTER — Encounter: Payer: Self-pay | Admitting: Family Medicine

## 2023-08-23 ENCOUNTER — Ambulatory Visit (INDEPENDENT_AMBULATORY_CARE_PROVIDER_SITE_OTHER): Payer: BC Managed Care – PPO | Admitting: Family Medicine

## 2023-08-23 VITALS — BP 130/80 | HR 77 | Temp 98.3°F | Wt 213.0 lb

## 2023-08-23 DIAGNOSIS — Z7984 Long term (current) use of oral hypoglycemic drugs: Secondary | ICD-10-CM

## 2023-08-23 DIAGNOSIS — E1165 Type 2 diabetes mellitus with hyperglycemia: Secondary | ICD-10-CM | POA: Diagnosis not present

## 2023-08-23 DIAGNOSIS — E1169 Type 2 diabetes mellitus with other specified complication: Secondary | ICD-10-CM

## 2023-08-23 DIAGNOSIS — Z7985 Long-term (current) use of injectable non-insulin antidiabetic drugs: Secondary | ICD-10-CM

## 2023-08-23 DIAGNOSIS — E785 Hyperlipidemia, unspecified: Secondary | ICD-10-CM | POA: Diagnosis not present

## 2023-08-23 LAB — POCT GLYCOSYLATED HEMOGLOBIN (HGB A1C): Hemoglobin A1C: 8.3 % — AB (ref 4.0–5.6)

## 2023-08-23 MED ORDER — TRULICITY 3 MG/0.5ML ~~LOC~~ SOAJ: 3.0000 mg | SUBCUTANEOUS | 3 refills | Status: DC

## 2023-08-23 NOTE — Progress Notes (Signed)
 Established Patient Office Visit  Subjective   Patient ID: Richard Carroll, male    DOB: 01-13-1971  Age: 53 y.o. MRN: [id number]  Chief Complaint  Patient presents with   Medical Management of Chronic Issues    HPI   Richard Carroll is seen for medical follow-up.  He has history of DVT, obesity, Crohn's colitis, type 2 diabetes, hyperlipidemia.  Diabetes has been poorly controlled.  Last A1c 7.8%.  He is currently on Trulicity  1.5 mg subcutaneous weekly and since last visit had gone up on dosage of Trulicity .  He has had stress recently of his dad dying a few weeks ago.  Complications of Alzheimer's disease.  Richard Carroll uses Dexcom and based on his Dexcom meter average blood sugar is low 200s which correlates with about 8.3% A1c.  He also remains on Jardiance , Basaglar , and sliding scale NovoLog .  No recent hypoglycemia.  Does admit to some poor compliance with diet recently.  He has hyperlipidemia treated with rosuvastatin  20 mg daily and recent LDL cholesterol of 61.  No recent chest pains.  Past Medical History:  Diagnosis Date   Arthritis    Crohn's colitis, with rectal bleeding (HCC) 10/22/2022   Diabetes mellitus type II    DIABETES MELLITUS, TYPE II 08/18/2009   on meds   Hay fever    with allergies   Hyperlipidemia    HYPERLIPIDEMIA 08/18/2009   on meds   Seasonal allergies    Past Surgical History:  Procedure Laterality Date   BIOPSY  10/25/2022   Procedure: BIOPSY;  Surgeon: Asencion Blacksmith, MD;  Location: [facility];  Service: Gastroenterology;;   COLONOSCOPY  01/2021   COLONOSCOPY WITH PROPOFOL  N/A 10/25/2022   Procedure: COLONOSCOPY WITH PROPOFOL ;  Surgeon: Asencion Blacksmith, MD;  Location: [facility];  Service: Gastroenterology;  Laterality: N/A;   FINGER SURGERY Right    ring finger   KNEE SURGERY Right 2005    reports that he has never smoked. His smokeless tobacco use includes chew. He reports that he does not currently use alcohol. He reports that he does not use  drugs. family history includes Alcohol abuse in his maternal grandfather; Colon cancer in his paternal uncle; Colon cancer (age of onset: 79) in his father; Diabetes in his maternal grandmother and mother; Heart disease in his father; Hyperlipidemia in his mother; Hypertension in his mother; Prostate cancer in his paternal uncle; Prostate cancer (age of onset: 3) in his father; Stroke in his mother. No Known Allergies  Review of Systems  Constitutional:  Negative for chills and fever.  Respiratory:  Negative for shortness of breath.   Cardiovascular:  Negative for chest pain.      Objective:      BP 130/80 (BP Location: Left Arm, Patient Position: Sitting, Cuff Size: Normal)   Pulse 77   Temp 98.3 F (36.8 C) (Oral)   Wt 213 lb (96.6 kg)   SpO2 97%   BMI 30.56 kg/m  BP Readings from Last 3 Encounters:  08/23/23 130/80  08/17/23 130/82  06/07/23 132/80   Wt Readings from Last 3 Encounters:  08/23/23 213 lb (96.6 kg)  08/17/23 214 lb (97.1 kg)  06/07/23 216 lb 3.2 oz (98.1 kg)      Physical Exam Vitals reviewed.  Constitutional:      Appearance: He is well-developed.  Eyes:     Pupils: Pupils are equal, round, and reactive to light.  Neck:     Thyroid: No thyromegaly.  Cardiovascular:  Rate and Rhythm: Normal rate and regular rhythm.  Pulmonary:     Effort: Pulmonary effort is normal. No respiratory distress.     Breath sounds: Normal breath sounds. No wheezing or rales.  Neurological:     Mental Status: He is alert and oriented to person, place, and time.      Results for orders placed or performed in visit on 08/23/23  POC HgB A1c  Result Value Ref Range   Hemoglobin A1C 8.3 (A) 4.0 - 5.6 %   HbA1c POC (<> result, manual entry)     HbA1c, POC (prediabetic range)     HbA1c, POC (controlled diabetic range)        The ASCVD Risk score (Arnett DK, et al., 2019) failed to calculate for the following reasons:   The valid total cholesterol range is 130  to 320 mg/dL    Assessment & Plan:   #1 type 2 diabetes.  Surprisingly worse control today with A1c 8.3% in spite of increasing Trulicity  recently 1.5 mg.  He is tolerating well.  Also remains on Jardiance  and Basaglar .  Previous intolerance of metformin .  Recent urine microalbumin screen unremarkable.  We discussed increasing Trulicity  to 3 mg once weekly.  We also discussed other alternatives switching to more potent Mounjaro but he would like to try 1 more dose increase of Trulicity  first.  Discussed diet in some detail.  Try to scale back sugars and starches.  Also recommend try to engage in more diligent exercise such as walking briskly 30 minutes 5 times per week Set up 3 to 54-month follow-up  #2 hyperlipidemia.  On rosuvastatin  20 mg daily.  Recent LDL cholesterol stable at 61.  Continue low saturated fat diet   Return in about 4 months (around 12/24/2023).    Glean Lamy, MD

## 2023-08-23 NOTE — Patient Instructions (Signed)
 A1C today was still up at 8.3%  Increase the Trulicity  to 3 mg once weekly  Set up 4 month follow up.

## 2023-08-25 LAB — CALPROTECTIN: Calprotectin: 105 ug/g

## 2023-08-26 ENCOUNTER — Ambulatory Visit: Payer: Self-pay | Admitting: Gastroenterology

## 2023-09-06 ENCOUNTER — Other Ambulatory Visit: Payer: Self-pay | Admitting: Family Medicine

## 2023-09-07 ENCOUNTER — Encounter: Payer: Self-pay | Admitting: Family Medicine

## 2023-09-07 MED ORDER — DOXYCYCLINE HYCLATE 100 MG PO TABS: 100.0000 mg | ORAL_TABLET | Freq: Two times a day (BID) | ORAL | 0 refills | Status: AC

## 2023-09-07 MED ORDER — DOXYCYCLINE HYCLATE 100 MG PO TABS: 100.0000 mg | ORAL_TABLET | Freq: Two times a day (BID) | ORAL | 0 refills | Status: DC

## 2023-09-07 NOTE — Addendum Note (Signed)
 Addended by: Aurelio Leer on: 09/07/2023 08:15 AM   Modules accepted: Orders

## 2023-09-07 NOTE — Telephone Encounter (Signed)
 Rx was sent to [facility] in error. I spoke with Norberta Beans with [facility] and rx was cancelled and sent to correct pharmacy.

## 2023-09-12 ENCOUNTER — Ambulatory Visit (INDEPENDENT_AMBULATORY_CARE_PROVIDER_SITE_OTHER): Admitting: Family Medicine

## 2023-09-12 ENCOUNTER — Encounter: Payer: Self-pay | Admitting: Family Medicine

## 2023-09-12 VITALS — BP 130/74 | HR 108 | Temp 98.1°F | Wt 213.2 lb

## 2023-09-12 DIAGNOSIS — Z7985 Long-term (current) use of injectable non-insulin antidiabetic drugs: Secondary | ICD-10-CM

## 2023-09-12 DIAGNOSIS — H109 Unspecified conjunctivitis: Secondary | ICD-10-CM | POA: Diagnosis not present

## 2023-09-12 DIAGNOSIS — E1165 Type 2 diabetes mellitus with hyperglycemia: Secondary | ICD-10-CM

## 2023-09-12 DIAGNOSIS — L0211 Cutaneous abscess of neck: Secondary | ICD-10-CM | POA: Diagnosis not present

## 2023-09-12 DIAGNOSIS — R21 Rash and other nonspecific skin eruption: Secondary | ICD-10-CM

## 2023-09-12 MED ORDER — DOXYCYCLINE HYCLATE 100 MG PO CAPS: 100.0000 mg | ORAL_CAPSULE | Freq: Two times a day (BID) | ORAL | 0 refills | Status: DC

## 2023-09-12 MED ORDER — TOBRAMYCIN 0.3 % OP SOLN: 2.0000 [drp] | OPHTHALMIC | 0 refills | Status: DC

## 2023-09-12 NOTE — Progress Notes (Signed)
 Richard Carroll

## 2023-09-12 NOTE — Patient Instructions (Signed)
Return in 2 days for packing removal and wound recheck.

## 2023-09-12 NOTE — Progress Notes (Signed)
 Established Patient Office Visit  Subjective   Patient ID: Richard Carroll, male    DOB: 02/05/1971  Age: 53 y.o. MRN: [id number]  Chief Complaint  Patient presents with   Mass   Eye Problem    HPI   Richard Carroll is seen today to evaluate the following items  Posterior neck pain and swelling.  [place] and developed some pain and swelling and redness of the neck.  He recognized this as an abscess.  Has not had previous abscess in the same location.  He called and we did agree to call in doxycycline  which she has been taking regularly.  He has been using some warm compresses and taking hot showers.  Started spontaneously draining some earlier today.  He states this has gone down some in size even through the day.  No fevers or chills.  Does have type 2 diabetes with recent A1c 8.3%.  We bumped his Trulicity  up to 3 mg subcutaneous weekly.  Tolerating well.  Also has facial rash.  He did get into the pool 1 time when he was at the beach and thinks he may have had some irritation following that.  He has recently had some purulent like secretions and matting of the eyes bilaterally for the past several days.  He notices a dry scaly rash especially below the lower lids bilaterally.  No blurred vision.  Past Medical History:  Diagnosis Date   Arthritis    Crohn's colitis, with rectal bleeding (HCC) 10/22/2022   Diabetes mellitus type II    DIABETES MELLITUS, TYPE II 08/18/2009   on meds   Hay fever    with allergies   Hyperlipidemia    HYPERLIPIDEMIA 08/18/2009   on meds   Seasonal allergies    Past Surgical History:  Procedure Laterality Date   BIOPSY  10/25/2022   Procedure: BIOPSY;  Surgeon: Asencion Blacksmith, MD;  Location: [facility];  Service: Gastroenterology;;   COLONOSCOPY  01/2021   COLONOSCOPY WITH PROPOFOL  N/A 10/25/2022   Procedure: COLONOSCOPY WITH PROPOFOL ;  Surgeon: Asencion Blacksmith, MD;  Location: [facility];  Service:  Gastroenterology;  Laterality: N/A;   FINGER SURGERY Right    ring finger   KNEE SURGERY Right 2005    reports that he has never smoked. His smokeless tobacco use includes chew. He reports that he does not currently use alcohol. He reports that he does not use drugs. family history includes Alcohol abuse in his maternal grandfather; Colon cancer in his paternal uncle; Colon cancer (age of onset: 33) in his father; Diabetes in his maternal grandmother and mother; Heart disease in his father; Hyperlipidemia in his mother; Hypertension in his mother; Prostate cancer in his paternal uncle; Prostate cancer (age of onset: 60) in his father; Stroke in his mother. No Known Allergies  Review of Systems  Constitutional:  Negative for chills and fever.  Eyes:  Positive for discharge and redness. Negative for blurred vision and pain.  Cardiovascular:  Negative for chest pain.  Skin:  Positive for rash.      Objective:     BP 130/74 (BP Location: Left Arm, Patient Position: Sitting, Cuff Size: Normal)   Pulse (!) 108   Temp 98.1 F (36.7 C) (Oral)   Wt 213 lb 3.2 oz (96.7 kg)   SpO2 96%   BMI 30.59 kg/m  BP Readings from Last 3 Encounters:  09/12/23 130/74  08/23/23 130/80  08/17/23 130/82  Wt Readings from Last 3 Encounters:  09/12/23 213 lb 3.2 oz (96.7 kg)  08/23/23 213 lb (96.6 kg)  08/17/23 214 lb (97.1 kg)      Physical Exam Vitals reviewed.  Constitutional:      General: He is not in acute distress.    Appearance: He is not ill-appearing.  Eyes:     Comments: He has some increased conjunctival erythema bilaterally.  Little bit of yellowish purulent like secretions inner canthus bilaterally  Cardiovascular:     Rate and Rhythm: Normal rate and regular rhythm.  Pulmonary:     Effort: Pulmonary effort is normal.     Breath sounds: Normal breath sounds. No wheezing or rales.  Skin:    Comments: Posterior neck reveals approximately 4 x 4 centimeter area of some increased  erythema.  He has both some induration and areas of fluctuance.  Near the center has a little bit of drainage of purulence.  This drains pus when applying pressure and many different directions.  Facial exam reveals dry somewhat crusted appearance of the skin below the lower lids bilaterally.  Neurological:     Mental Status: He is alert.      No results found for any visits on 09/12/23.  Last CBC Lab Results  Component Value Date   WBC 7.2 08/17/2023   HGB 16.3 08/17/2023   HCT 48.4 08/17/2023   MCV 91.6 08/17/2023   MCH 30.8 12/12/2022   RDW 13.2 08/17/2023   PLT 246.0 08/17/2023   Last hemoglobin A1c Lab Results  Component Value Date   HGBA1C 8.3 (A) 08/23/2023      The ASCVD Risk score (Arnett DK, et al., 2019) failed to calculate for the following reasons:   The valid total cholesterol range is 130 to 320 mg/dL    Assessment & Plan:   #1 abscess posterior neck.  This started last week when he was at the beach.  Has been on doxycycline  and using heat.  Started spontaneously draining earlier today.  Rather large abscess and he was getting very little drainage from spontaneous drainage and had significant amount of fluctuance.  Therefore, we recommended I&D.  Patient consented.  Local anesthesia 1% plain Xylocaine .  Using #11 blade made 1 cm incision.  Copious pus was expressed.  We used some curved hemostats and freed up some deeper pockets.  Wound cavity was packed with quarter inch iodoform gauze.  Minimal bleeding.  Outer dressing applied.  Try to keep relatively dry and return in 2 days for packing removal and reassessment.  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  We wrote him for a note to be out of work tomorrow.  He is out Wednesday.  Reassess at that time  #2 probable bilateral conjunctivitis -bacterial.  Use warm compresses.  Start Tobrex eyedrops 2 drops each eye every 4 hours  while awake  #3 facial rash.  Possible irritation from recent chlorine.  He has very dry skin underneath both eyes.  Will try some 1% hydrocortisone cream and avoid getting into the eyes  #4 type 2 diabetes.  History of poor control.  Recent bump in Trulicity .  Make sure he keeps follow-up in about 2-1/2 to 3 months to reassess A1c  Glean Lamy, MD

## 2023-09-13 ENCOUNTER — Encounter: Payer: Self-pay | Admitting: Family Medicine

## 2023-09-14 ENCOUNTER — Ambulatory Visit (INDEPENDENT_AMBULATORY_CARE_PROVIDER_SITE_OTHER): Admitting: Family Medicine

## 2023-09-14 ENCOUNTER — Encounter: Payer: Self-pay | Admitting: Family Medicine

## 2023-09-14 VITALS — BP 132/80 | HR 95 | Wt 212.0 lb

## 2023-09-14 DIAGNOSIS — H109 Unspecified conjunctivitis: Secondary | ICD-10-CM

## 2023-09-14 DIAGNOSIS — R030 Elevated blood-pressure reading, without diagnosis of hypertension: Secondary | ICD-10-CM

## 2023-09-14 DIAGNOSIS — L0211 Cutaneous abscess of neck: Secondary | ICD-10-CM

## 2023-09-14 NOTE — Patient Instructions (Signed)
 Pull packing in 2 days   After pulling packing, clean daily with soap and water  Keep covered until skin has sealed over and no drainage.

## 2023-09-14 NOTE — Progress Notes (Signed)
 Established Patient Office Visit  Subjective   Patient ID: Richard Carroll, male    DOB: 08-16-1970  Age: 53 y.o. MRN: [id number]  Chief Complaint  Patient presents with   Medical Management of Chronic Issues    HPI   Richard Carroll is here for follow-up regarding a large abscess posterior neck.  He has still been draining considerably.  We placed him on doxycycline  last week when he was at the beach.  This started spontaneously draining some few days ago.  We performed I&D 2 days ago.  He does have less swelling and overall less pain.  No fever.  Remains on doxycycline .  He has changed outer dressing several times.  He had some recent bilateral eye drainage and we had concern for bilateral conjunctivitis.  He is on TobraDex and eye symptoms gradually improving.  Past Medical History:  Diagnosis Date   Arthritis    Crohn's colitis, with rectal bleeding (HCC) 10/22/2022   Diabetes mellitus type II    DIABETES MELLITUS, TYPE II 08/18/2009   on meds   Hay fever    with allergies   Hyperlipidemia    HYPERLIPIDEMIA 08/18/2009   on meds   Seasonal allergies    Past Surgical History:  Procedure Laterality Date   BIOPSY  10/25/2022   Procedure: BIOPSY;  Surgeon: Asencion Blacksmith, MD;  Location: [facility];  Service: Gastroenterology;;   COLONOSCOPY  01/2021   COLONOSCOPY WITH PROPOFOL  N/A 10/25/2022   Procedure: COLONOSCOPY WITH PROPOFOL ;  Surgeon: Asencion Blacksmith, MD;  Location: [facility];  Service: Gastroenterology;  Laterality: N/A;   FINGER SURGERY Right    ring finger   KNEE SURGERY Right 2005    reports that he has never smoked. His smokeless tobacco use includes chew. He reports that he does not currently use alcohol. He reports that he does not use drugs. family history includes Alcohol abuse in his maternal grandfather; Colon cancer in his paternal uncle; Colon cancer (age of onset: 89) in his father; Diabetes in his maternal grandmother and mother; Heart disease in his  father; Hyperlipidemia in his mother; Hypertension in his mother; Prostate cancer in his paternal uncle; Prostate cancer (age of onset: 76) in his father; Stroke in his mother. No Known Allergies  Review of Systems  Constitutional:  Negative for chills and fever.      Objective:     BP 132/80 (BP Location: Right Arm, Cuff Size: Normal)   Pulse 95   Wt 212 lb (96.2 kg)   SpO2 98%   BMI 30.42 kg/m  BP Readings from Last 3 Encounters:  09/14/23 132/80  09/12/23 130/74  08/23/23 130/80   Wt Readings from Last 3 Encounters:  09/14/23 212 lb (96.2 kg)  09/12/23 213 lb 3.2 oz (96.7 kg)  08/23/23 213 lb (96.6 kg)      Physical Exam Vitals reviewed.  Constitutional:      General: He is not in acute distress.    Appearance: He is not ill-appearing.  Cardiovascular:     Rate and Rhythm: Normal rate and regular rhythm.  Skin:    Comments: Dressing is removed from neck.  Packing in place.  This was removed and he has still some purulence draining from the wound cavity but much less than a couple days ago.  Surrounding erythema and swelling are resolving.  Minimally tender.  No fluctuance.  Neurological:     Mental Status: He is alert.      No results found for any  visits on 09/14/23.    The ASCVD Risk score (Arnett DK, et al., 2019) failed to calculate for the following reasons:   The valid total cholesterol range is 130 to 320 mg/dL    Assessment & Plan:   #1 large abscess posterior neck.  Overall improving.  Packing removed.  Still has some purulent drainage.  We recommend repacking and patient agreed.  We are able to pack quarter inch iodoform gauze into the wound cavity after cleaning and irrigating with hydrogen peroxide.  Patient tolerated well.  He will remove packing himself or have his wife remove this in 2 days at home and then clean daily with soap and water and leave packing out at that point.  Keep covered until wound has sealed off.  Follow-up for any recurrent  swelling or redness or other concerns  #2 bilateral conjunctivitis symptoms.  Slowly improving on TobraDex.  #3 elevated blood pressure.  Improved significantly after rest right arm seated.  Continue to monitor  Glean Lamy, MD

## 2023-09-26 ENCOUNTER — Encounter: Payer: Self-pay | Admitting: Gastroenterology

## 2023-10-04 ENCOUNTER — Encounter: Payer: Self-pay | Admitting: Gastroenterology

## 2023-10-04 ENCOUNTER — Ambulatory Visit: Admitting: Gastroenterology

## 2023-10-04 VITALS — BP 127/91 | HR 72 | Temp 97.5°F | Resp 17 | Ht 70.0 in | Wt 213.0 lb

## 2023-10-04 DIAGNOSIS — Z1211 Encounter for screening for malignant neoplasm of colon: Secondary | ICD-10-CM | POA: Diagnosis not present

## 2023-10-04 DIAGNOSIS — Z800 Family history of malignant neoplasm of digestive organs: Secondary | ICD-10-CM | POA: Diagnosis not present

## 2023-10-04 DIAGNOSIS — Z860101 Personal history of adenomatous and serrated colon polyps: Secondary | ICD-10-CM

## 2023-10-04 DIAGNOSIS — Z860100 Personal history of colon polyps, unspecified: Secondary | ICD-10-CM

## 2023-10-04 DIAGNOSIS — K5010 Crohn's disease of large intestine without complications: Secondary | ICD-10-CM | POA: Diagnosis not present

## 2023-10-04 DIAGNOSIS — K50119 Crohn's disease of large intestine with unspecified complications: Secondary | ICD-10-CM

## 2023-10-04 DIAGNOSIS — D123 Benign neoplasm of transverse colon: Secondary | ICD-10-CM | POA: Diagnosis not present

## 2023-10-04 DIAGNOSIS — D122 Benign neoplasm of ascending colon: Secondary | ICD-10-CM

## 2023-10-04 DIAGNOSIS — K5140 Inflammatory polyps of colon without complications: Secondary | ICD-10-CM | POA: Diagnosis not present

## 2023-10-04 MED ORDER — SODIUM CHLORIDE 0.9 % IV SOLN: 500.0000 mL | INTRAVENOUS | Status: DC

## 2023-10-04 NOTE — Progress Notes (Signed)
 Called to room to assist during endoscopic procedure.  Patient ID and intended procedure confirmed with present staff. Received instructions for my participation in the procedure from the performing physician.

## 2023-10-04 NOTE — Patient Instructions (Signed)
 Resume previous diet and medications. Awaiting pathology results. Repeat Colonoscopy date to be determined based on pathology results.  Handout provided on Colon polyps   YOU HAD AN ENDOSCOPIC PROCEDURE TODAY AT THE [facility]:   Refer to the procedure report that was given to you for any specific questions about what was found during the examination.  If the procedure report does not answer your questions, please call your gastroenterologist to clarify.  If you requested that your care partner not be given the details of your procedure findings, then the procedure report has been included in a sealed envelope for you to review at your convenience later.  YOU SHOULD EXPECT: Some feelings of bloating in the abdomen. Passage of more gas than usual.  Walking can help get rid of the air that was put into your GI tract during the procedure and reduce the bloating. If you had a lower endoscopy (such as a colonoscopy or flexible sigmoidoscopy) you may notice spotting of blood in your stool or on the toilet paper. If you underwent a bowel prep for your procedure, you may not have a normal bowel movement for a few days.  Please Note:  You might notice some irritation and congestion in your nose or some drainage.  This is from the oxygen used during your procedure.  There is no need for concern and it should clear up in a day or so.  SYMPTOMS TO REPORT IMMEDIATELY:  Following lower endoscopy (colonoscopy or flexible sigmoidoscopy):  Excessive amounts of blood in the stool  Significant tenderness or worsening of abdominal pains  Swelling of the abdomen that is new, acute  Fever of 100F or higher  For urgent or emergent issues, a gastroenterologist can be reached at any hour by calling [phone]. Do not use MyChart messaging for urgent concerns.    DIET:  We do recommend a small meal at first, but then you may proceed to your regular diet.  Drink plenty of fluids but you should avoid  alcoholic beverages for 24 hours.  ACTIVITY:  You should plan to take it easy for the rest of today and you should NOT DRIVE or use heavy machinery until tomorrow (because of the sedation medicines used during the test).    FOLLOW UP: Our staff will call the number listed on your records the next business day following your procedure.  We will call around 7:15- 8:00 am to check on you and address any questions or concerns that you may have regarding the information given to you following your procedure. If we do not reach you, we will leave a message.     If any biopsies were taken you will be contacted by phone or by letter within the next 1-3 weeks.  Please call us at [phone] if you have not heard about the biopsies in 3 weeks.    SIGNATURES/CONFIDENTIALITY: You and/or your care partner have signed paperwork which will be entered into your electronic medical record.  These signatures attest to the fact that that the information above on your After Visit Summary has been reviewed and is understood.  Full responsibility of the confidentiality of this discharge information lies with you and/or your care-partner.

## 2023-10-04 NOTE — Progress Notes (Signed)
 Pt's states no medical or surgical changes since previsit or office visit.

## 2023-10-04 NOTE — Op Note (Signed)
 [facility] Patient Name: Richard Carroll Procedure Date: 10/04/2023 8:02 AM MRN: [id number] Endoscopist: Sandor Flatter , MD, [id number] Age: 53 Referring MD:  Date of Birth: 08/16/1970 Gender: Male Account #: [id number] Procedure:                Colonoscopy Indications:              Disease activity assessment of Crohn's disease of                            the colon, Assess therapeutic response to therapy                            of Crohn's disease of the colon                           Personal history of colon polyps.                           Currently in clinical remission on Lialda                             monotherapy with recent normal ESR, CRP and                            borderline calprotectin at 105 Medicines:                Monitored Anesthesia Care Procedure:                Pre-Anesthesia Assessment:                           - Prior to the procedure, a History and Physical                            was performed, and patient medications and                            allergies were reviewed. The patient's tolerance of                            previous anesthesia was also reviewed. The risks                            and benefits of the procedure and the sedation                            options and risks were discussed with the patient.                            All questions were answered, and informed consent                            was obtained. Prior Anticoagulants: The patient has  taken no anticoagulant or antiplatelet agents. ASA                            Grade Assessment: II - A patient with mild systemic                            disease. After reviewing the risks and benefits,                            the patient was deemed in satisfactory condition to                            undergo the procedure.                           After obtaining informed consent, the colonoscope                             was passed under direct vision. Throughout the                            procedure, the patient's blood pressure, pulse, and                            oxygen saturations were monitored continuously. The                            CF [id number] #[id number] was introduced through the anus                            and advanced to the the terminal ileum. The                            colonoscopy was performed without difficulty. The                            patient tolerated the procedure well. The quality                            of the bowel preparation was fair. The terminal                            ileum, ileocecal valve, appendiceal orifice, and                            rectum were photographed. Scope In: 8:07:52 AM Scope Out: 8:29:52 AM Scope Withdrawal Time: 0 hours 19 minutes 17 seconds  Total Procedure Duration: 0 hours 22 minutes 0 seconds  Findings:                 The perianal and digital rectal examinations were                            normal.  Four sessile polyps were found in the transverse                            colon (2) and ascending colon (2). The polyps were                            2 to 5 mm in size. These polyps were removed with a                            cold snare. Resection and retrieval were complete.                            Estimated blood loss was minimal.                           Diffuse pseudopolyps were found in the                            recto-sigmoid colon and in the distal transverse                            colon.                           The mucosa was otherwise normal appearing                            throughout the colon. No areas of erythema, edema,                            erosions, or aphthous ulcers were found in the                            entire colon. Biopsies were taken with a cold                            forceps for histology. Estimated blood loss was                             minimal. There was retained stool and food debris                            scattered throughout the colon. Performed                            irrigation with copious lavage with adequate views.                           The retroflexed view of the distal rectum and anal                            verge was normal and showed no anal or rectal  abnormalities.                           The terminal ileum appeared normal. Complications:            No immediate complications. Estimated Blood Loss:     Estimated blood loss was minimal. Impression:               - Preparation of the colon was fair.                           - Four 2 to 5 mm polyps in the transverse colon and                            in the ascending colon, removed with a cold snare.                            Resected and retrieved.                           - Pseudopolyps in the recto-sigmoid colon and in                            the distal transverse colon.                           - Normal mucosa in the entire examined colon.                            Biopsied.                           - The distal rectum and anal verge are normal on                            retroflexion view.                           - The examined portion of the ileum was normal. Recommendation:           - Patient has a contact number available for                            emergencies. The signs and symptoms of potential                            delayed complications were discussed with the                            patient. Return to normal activities tomorrow.                            Written discharge instructions were provided to the                            patient.                           -  Resume previous diet.                           - Continue present medications.                           - Await pathology results.                           - Repeat colonoscopy for surveillance based on                             pathology results.                           - Return to GI office at appointment to be                            scheduled. Sandor Flatter, MD 10/04/2023 8:40:16 AM

## 2023-10-04 NOTE — Progress Notes (Signed)
 GASTROENTEROLOGY PROCEDURE H&P NOTE   Primary Care Physician: Micheal Wolm ORN, MD    Reason for Procedure:   Crohns Disease, colon polyp surveillance  Plan:    Colonoscopy  Patient is appropriate for endoscopic procedure(s) in the ambulatory ([facility]) setting.  The nature of the procedure, as well as the risks, benefits, and alternatives were carefully and thoroughly reviewed with the patient. Ample time for discussion and questions allowed. The patient understood, was satisfied, and agreed to proceed.     HPI: Richard Carroll is a 53 y.o. male who presents for colonoscopy for ongoing surveillance of Crohn's colitis.  Currently in clinical remission with Lialda .  Additionally, family history of colon cancer and personal history of tubular adenomas and due for repeat colonoscopy for ongoing polyp surveillance.  Otherwise no significant changes in clinical history since last OV on 08/17/2023.  Repeat labs at that time with fecal calprotectin 105 (borderline) and otherwise normal CBC, BMP, ESR, CRP.  GI PCR panel negative/normal.  DEXA scan normal in March.  Past Medical History:  Diagnosis Date   Arthritis    Crohn's colitis, with rectal bleeding (HCC) 10/22/2022   Diabetes mellitus type II    DIABETES MELLITUS, TYPE II 08/18/2009   on meds   Hay fever    with allergies   Hyperlipidemia    HYPERLIPIDEMIA 08/18/2009   on meds   Seasonal allergies     Past Surgical History:  Procedure Laterality Date   BIOPSY  10/25/2022   Procedure: BIOPSY;  Surgeon: Aneita Gwendlyn ONEIDA, MD;  Location: [facility];  Service: Gastroenterology;;   COLONOSCOPY  01/2021   COLONOSCOPY WITH PROPOFOL  N/A 10/25/2022   Procedure: COLONOSCOPY WITH PROPOFOL ;  Surgeon: Aneita Gwendlyn ONEIDA, MD;  Location: [facility];  Service: Gastroenterology;  Laterality: N/A;   FINGER SURGERY Right    ring finger   KNEE SURGERY Right 2005    Prior to Admission medications   Medication Sig Start Date End Date  Taking? Authorizing Provider  Blood Glucose Monitoring Suppl DEVI 1 each by Does not apply route 3 (three) times daily. May dispense any manufacturer covered by patient's insurance. 10/27/22  Yes Dahal, Chapman, MD  clobetasol ointment (TEMOVATE) 0.05 % 1 application to affected area hands Externally once -Twice a day monday thru friday only when flared; Duration: 30 days 12/09/15  Yes [provider]  Continuous Glucose Sensor (DEXCOM G7 SENSOR) MISC CHANGE SENSOR EVERY 10 DAYS 09/06/23  Yes Burchette, Wolm ORN, MD  doxycycline  (VIBRA -TABS) 100 MG tablet Take 100 mg by mouth 2 (two) times daily. 07/01/23  Yes [provider]  empagliflozin  (JARDIANCE ) 10 MG TABS tablet Take 1 tablet (10 mg total) by mouth daily before breakfast. 11/23/22  Yes Burchette, Wolm ORN, MD  glucose blood (CONTOUR TEST) test strip Use as instructed 10/27/22  Yes Dahal, Chapman, MD  insulin  aspart (NOVOLOG ) 100 UNIT/ML FlexPen Inject 0-6 Units into the skin 3 (three) times daily with meals. Check Blood Glucose (BG) and inject per scale: BG <150= 0 unit; BG 150-200= 1 unit; BG 201-250= 2 unit; BG 251-300= 3 unit; BG 301-350= 4 unit; BG 351-400= 5 unit; BG >400= 6 unit and Call Primary Care. 05/11/23  Yes Burchette, Wolm ORN, MD  Insulin  Glargine-aglr (REZVOGLAR  KWIKPEN) 100 UNIT/ML SOPN INJECT 8 UNITS INTO THE SKIN 2 (TWO) TIMES DAILY. 08/08/23  Yes Burchette, Wolm ORN, MD  Insulin  Pen Needle (B-D UF III MINI PEN NEEDLES) 31G X 5 MM MISC 1 EACH BY DOES NOT APPLY ROUTE  3 (THREE) TIMES DAILY. MAY DISPENSE ANY MANUFACTURER COVERED BY PATIENT'S INSURANCE. 03/11/23  Yes Burchette, Wolm ORN, MD  Lancet Device MISC 1 each by Does not apply route 3 (three) times daily. May dispense any manufacturer covered by patient's insurance. 10/27/22  Yes Dahal, Chapman, MD  Lancets MISC 1 each by Does not apply route 3 (three) times daily. Use as directed to check blood sugar. May dispense any manufacturer covered by patient's insurance and fits  patient's device. 10/27/22  Yes Dahal, Chapman, MD  LANTUS  SOLOSTAR 100 UNIT/ML Solostar Pen SMARTSIG:8 Unit(s) SUB-Q Twice Daily 08/08/23  Yes [provider]  mesalamine  (LIALDA ) 1.2 g EC tablet TAKE 2 TABLETS (2.4 G TOTAL) BY MOUTH 2 (TWO) TIMES DAILY WITH A MEAL. 07/15/23 10/13/23 Yes Mirranda Monrroy V, DO  Multiple Vitamin (ONE-A-DAY MENS PO) Take 1 tablet by mouth daily.   Yes [provider]  Multiple Vitamins-Minerals (MULTIVITAMIN) LIQD Orally   Yes [provider]  triamcinolone  ointment (KENALOG ) 0.1 % 1 application to affected area hands Externally once a day PRN; Duration: 30 days 12/09/15  Yes [provider]  Dulaglutide  (TRULICITY ) 3 MG/0.5ML SOAJ Inject 3 mg into the skin once a week. 08/23/23   Burchette, Wolm ORN, MD  rosuvastatin  (CRESTOR ) 20 MG tablet TAKE 1 TABLET BY MOUTH EVERY DAY 05/23/23   Burchette, Wolm ORN, MD  sildenafil  (VIAGRA ) 100 MG tablet Take 0.5-1 tablets (50-100 mg total) by mouth daily as needed for erectile dysfunction. 01/19/19   Burchette, Wolm ORN, MD  tobramycin  (TOBREX ) 0.3 % ophthalmic solution Place 2 drops into both eyes every 4 (four) hours. 09/12/23   Burchette, Wolm ORN, MD    Current Outpatient Medications  Medication Sig Dispense Refill   Blood Glucose Monitoring Suppl DEVI 1 each by Does not apply route 3 (three) times daily. May dispense any manufacturer covered by patient's insurance. 1 each 0   clobetasol ointment (TEMOVATE) 0.05 % 1 application to affected area hands Externally once -Twice a day monday thru friday only when flared; Duration: 30 days     Continuous Glucose Sensor (DEXCOM G7 SENSOR) MISC CHANGE SENSOR EVERY 10 DAYS 3 each 3   doxycycline  (VIBRA -TABS) 100 MG tablet Take 100 mg by mouth 2 (two) times daily.     empagliflozin  (JARDIANCE ) 10 MG TABS tablet Take 1 tablet (10 mg total) by mouth daily before breakfast. 30 tablet 11   glucose blood (CONTOUR TEST) test strip Use as instructed 250 each 0   insulin   aspart (NOVOLOG ) 100 UNIT/ML FlexPen Inject 0-6 Units into the skin 3 (three) times daily with meals. Check Blood Glucose (BG) and inject per scale: BG <150= 0 unit; BG 150-200= 1 unit; BG 201-250= 2 unit; BG 251-300= 3 unit; BG 301-350= 4 unit; BG 351-400= 5 unit; BG >400= 6 unit and Call Primary Care. 15 mL 2   Insulin  Glargine-aglr (REZVOGLAR  KWIKPEN) 100 UNIT/ML SOPN INJECT 8 UNITS INTO THE SKIN 2 (TWO) TIMES DAILY. 15 mL 2   Insulin  Pen Needle (B-D UF III MINI PEN NEEDLES) 31G X 5 MM MISC 1 EACH BY DOES NOT APPLY ROUTE 3 (THREE) TIMES DAILY. MAY DISPENSE ANY MANUFACTURER COVERED BY PATIENT'S INSURANCE. 100 each 3   Lancet Device MISC 1 each by Does not apply route 3 (three) times daily. May dispense any manufacturer covered by patient's insurance. 1 each 0   Lancets MISC 1 each by Does not apply route 3 (three) times daily. Use as directed to check blood sugar. May dispense any  manufacturer covered by [employer] and fits patient's device. 100 each 0   LANTUS  SOLOSTAR 100 UNIT/ML Solostar Pen SMARTSIG:8 Unit(s) SUB-Q Twice Daily     mesalamine  (LIALDA ) 1.2 g EC tablet TAKE 2 TABLETS (2.4 G TOTAL) BY MOUTH 2 (TWO) TIMES DAILY WITH A MEAL. 120 tablet 2   Multiple Vitamin (ONE-A-DAY MENS PO) Take 1 tablet by mouth daily.     Multiple Vitamins-Minerals (MULTIVITAMIN) LIQD Orally     triamcinolone  ointment (KENALOG ) 0.1 % 1 application to affected area hands Externally once a day PRN; Duration: 30 days     Dulaglutide  (TRULICITY ) 3 MG/0.5ML SOAJ Inject 3 mg into the skin once a week. 6 mL 3   rosuvastatin  (CRESTOR ) 20 MG tablet TAKE 1 TABLET BY MOUTH EVERY DAY 90 tablet 1   sildenafil  (VIAGRA ) 100 MG tablet Take 0.5-1 tablets (50-100 mg total) by mouth daily as needed for erectile dysfunction. 10 tablet 11   tobramycin  (TOBREX ) 0.3 % ophthalmic solution Place 2 drops into both eyes every 4 (four) hours. 5 mL 0   Current Facility-Administered Medications  Medication Dose Route Frequency  Provider Last Rate Last Admin   0.9 %  sodium chloride  infusion  500 mL Intravenous Continuous Suhas Estis V, DO        Allergies as of 10/04/2023   (No Known Allergies)    Family History  Problem Relation Age of Onset   Hyperlipidemia Mother    Stroke Mother    Hypertension Mother    Diabetes Mother    Prostate cancer Father 43       2004   Colon cancer Father 58       2013   Heart disease Father    Diabetes Maternal Grandmother    Alcohol abuse Maternal Grandfather    Colon cancer Paternal Uncle    Prostate cancer Paternal Uncle    Colon polyps Neg Hx    Esophageal cancer Neg Hx    Stomach cancer Neg Hx    Rectal cancer Neg Hx     Social History   Socioeconomic History   Marital status: Married    Spouse name: Not on file   Number of children: Not on file   Years of education: Not on file   Highest education level: 12th grade  Occupational History   Not on file  Tobacco Use   Smoking status: Never   Smokeless tobacco: Current    Types: Chew  Vaping Use   Vaping status: Never Used  Substance and Sexual Activity   Alcohol use: Not Currently   Drug use: No   Sexual activity: Not on file  Other Topics Concern   Not on file  Social History Narrative   Not on file   Social Drivers of Health   Financial Resource Strain: Low Risk  (08/22/2023)   Overall Financial Resource Strain (CARDIA)    Difficulty of Paying Living Expenses: Not very hard  Food Insecurity: No Food Insecurity (08/22/2023)   Hunger Vital Sign    Worried About Running Out of Food in the Last Year: Never true    Ran Out of Food in the Last Year: Never true  Transportation Needs: No Transportation Needs (08/22/2023)   PRAPARE - Administrator, Civil Service (Medical): No    Lack of Transportation (Non-Medical): No  Physical Activity: Insufficiently Active (08/22/2023)   Exercise Vital Sign    Days of Exercise per Week: 4 days    Minutes of Exercise per Session: 30  min   Stress: Stress Concern Present (08/22/2023)   [employer] of Occupational Health - Occupational Stress Questionnaire    Feeling of Stress : To some extent  Social Connections: Socially Integrated (08/22/2023)   Social Connection and Isolation Panel    Frequency of Communication with Friends and Family: Three times a week    Frequency of Social Gatherings with Friends and Family: Once a week    Attends Religious Services: More than 4 times per year    Active Member of [employer] or Organizations: Patient declined    Attends Engineer, structural: More than 4 times per year    Marital Status: Married  Catering manager Violence: Not At Risk (10/23/2022)   Humiliation, Afraid, Rape, and Kick questionnaire    Fear of Current or Ex-Partner: No    Emotionally Abused: No    Physically Abused: No    Sexually Abused: No    Physical Exam: Vital signs in last 24 hours: @BP  122/72   Pulse 88   Temp (!) 97.5 F (36.4 C)   Ht 5' 10 (1.778 m)   Wt 213 lb (96.6 kg)   SpO2 97%   BMI 30.56 kg/m  GEN: NAD EYE: Sclerae anicteric ENT: MMM CV: Non-tachycardic Pulm: CTA b/l GI: Soft, NT/ND NEURO:  Alert & Oriented x 3   Sandor Flatter, DO [facility]   10/04/2023 7:54 AM

## 2023-10-04 NOTE — Progress Notes (Signed)
 Report to PACU, RN, vss, BBS= Clear.

## 2023-10-05 ENCOUNTER — Telehealth: Payer: Self-pay

## 2023-10-05 NOTE — Telephone Encounter (Signed)
  Follow up Call-     10/04/2023    7:31 AM 01/30/2021    7:12 AM  Call back number  Post procedure Call Back phone  # [phone]  Permission to leave phone message Yes Yes     Patient questions:  Do you have a fever, pain , or abdominal swelling? No. Pain Score  0 *  Have you tolerated food without any problems? Yes.    Have you been able to return to your normal activities? Yes.    Do you have any questions about your discharge instructions: Diet   No. Medications  No. Follow up visit  No.  Do you have questions or concerns about your Care? No.  Actions: * If pain score is 4 or above: No action needed, pain <4.

## 2023-10-06 NOTE — Progress Notes (Signed)
 [facility] Quality Team Note  Name: Richard Carroll Date of Birth: 1971-01-26 MRN: [id number] Date: 10/06/2023  [facility] Quality Team has reviewed this patient's chart, please see recommendations below:  [facility] Quality Other; (CHART REVIEWED. MOST RECENT A1C OUT OF RANGE, NEED VALUE LESS THAN 8.0 FOR GAP CLOSURE. ABSTRACTED KIDNEY HEALTH EVALUATION LABS.)

## 2023-10-13 LAB — SURGICAL PATHOLOGY

## 2023-10-17 ENCOUNTER — Ambulatory Visit: Payer: Self-pay | Admitting: Gastroenterology

## 2023-11-11 ENCOUNTER — Other Ambulatory Visit: Payer: Self-pay | Admitting: Family Medicine

## 2023-11-24 ENCOUNTER — Other Ambulatory Visit: Payer: Self-pay | Admitting: Family Medicine

## 2023-12-08 ENCOUNTER — Telehealth: Payer: Self-pay

## 2023-12-08 NOTE — Telephone Encounter (Signed)
-----   Message from [facility] sent at 11/15/2023  8:31 AM EDT ----- Regarding: FW: [id number]  ----- Message ----- From: Benjamine Nat DEL, CMA Sent: 11/15/2023  12:00 AM EDT To: Nat DEL Benjamine, CMA Subject: [id number]                                   Pt needs 6 month follow up, dx Crohns, change in stools, gen abd pain. Nhr

## 2023-12-08 NOTE — Telephone Encounter (Signed)
 Left message for patient to return call to further discuss scheduling follow up appointment with Dr San or POD B app.  Will continue efforts.

## 2023-12-09 NOTE — Telephone Encounter (Signed)
 Patient advised he is scheduled to follow up with Dr San on 02-08-24 at 1:40pm.  Patient agreed to plan and verbalized understanding.  No further questions or concerns.

## 2023-12-16 ENCOUNTER — Other Ambulatory Visit: Payer: Self-pay | Admitting: Gastroenterology

## 2023-12-16 DIAGNOSIS — R1011 Right upper quadrant pain: Secondary | ICD-10-CM | POA: Diagnosis not present

## 2023-12-16 DIAGNOSIS — M546 Pain in thoracic spine: Secondary | ICD-10-CM | POA: Diagnosis not present

## 2023-12-16 DIAGNOSIS — R519 Headache, unspecified: Secondary | ICD-10-CM | POA: Diagnosis not present

## 2023-12-16 DIAGNOSIS — R079 Chest pain, unspecified: Secondary | ICD-10-CM | POA: Diagnosis not present

## 2023-12-16 DIAGNOSIS — S01552A Open bite of oral cavity, initial encounter: Secondary | ICD-10-CM | POA: Diagnosis not present

## 2023-12-16 DIAGNOSIS — Y92410 Unspecified street and highway as the place of occurrence of the external cause: Secondary | ICD-10-CM | POA: Diagnosis not present

## 2023-12-16 DIAGNOSIS — M542 Cervicalgia: Secondary | ICD-10-CM | POA: Diagnosis not present

## 2023-12-16 DIAGNOSIS — S199XXA Unspecified injury of neck, initial encounter: Secondary | ICD-10-CM | POA: Diagnosis not present

## 2023-12-16 DIAGNOSIS — Z041 Encounter for examination and observation following transport accident: Secondary | ICD-10-CM | POA: Diagnosis not present

## 2023-12-16 DIAGNOSIS — M545 Low back pain, unspecified: Secondary | ICD-10-CM | POA: Diagnosis not present

## 2023-12-16 DIAGNOSIS — G44309 Post-traumatic headache, unspecified, not intractable: Secondary | ICD-10-CM | POA: Diagnosis not present

## 2023-12-27 ENCOUNTER — Encounter: Payer: Self-pay | Admitting: Family Medicine

## 2023-12-27 ENCOUNTER — Ambulatory Visit: Admitting: Family Medicine

## 2023-12-28 ENCOUNTER — Ambulatory Visit (INDEPENDENT_AMBULATORY_CARE_PROVIDER_SITE_OTHER): Admitting: Family Medicine

## 2023-12-28 VITALS — BP 110/70 | HR 84 | Temp 97.5°F | Wt 209.0 lb

## 2023-12-28 DIAGNOSIS — L03032 Cellulitis of left toe: Secondary | ICD-10-CM | POA: Diagnosis not present

## 2023-12-28 MED ORDER — CEPHALEXIN 500 MG PO CAPS: 500.0000 mg | ORAL_CAPSULE | Freq: Three times a day (TID) | ORAL | 0 refills | Status: DC

## 2023-12-28 NOTE — Progress Notes (Signed)
   Subjective:    Patient ID: Richard Carroll, male    DOB: 11-22-1970, 53 y.o.   MRN: [id number]  HPI Here for 3 days of swelling and pain around the left great toenail. No recent trauma but he is on his feet all day at his job.    Review of Systems  Constitutional: Negative.   Respiratory: Negative.    Cardiovascular: Negative.        Objective:   Physical Exam Constitutional:      Appearance: Normal appearance.  Cardiovascular:     Rate and Rhythm: Normal rate and regular rhythm.     Pulses: Normal pulses.     Heart sounds: Normal heart sounds.  Pulmonary:     Effort: Pulmonary effort is normal.     Breath sounds: Normal breath sounds.  Skin:    Comments: The skin at the base and along the medial edge of the left great toenail is red, warm and tender. There is a small blister along the edge.   Neurological:     Mental Status: He is alert.           Assessment & Plan:  Paronychia. We opened the blister with a sterile needle and we extracted a fair amount of purulent fluid. He will dress this daily with Neosporin. Treat with 10 days of Keflex .  Garnette Olmsted, MD

## 2024-01-03 ENCOUNTER — Telehealth: Payer: Self-pay

## 2024-01-03 ENCOUNTER — Ambulatory Visit: Admitting: Family Medicine

## 2024-01-03 ENCOUNTER — Other Ambulatory Visit (HOSPITAL_COMMUNITY): Payer: Self-pay

## 2024-01-03 VITALS — BP 116/80 | HR 77 | Temp 98.3°F | Wt 207.8 lb

## 2024-01-03 DIAGNOSIS — L03032 Cellulitis of left toe: Secondary | ICD-10-CM

## 2024-01-03 DIAGNOSIS — E785 Hyperlipidemia, unspecified: Secondary | ICD-10-CM | POA: Diagnosis not present

## 2024-01-03 DIAGNOSIS — E1165 Type 2 diabetes mellitus with hyperglycemia: Secondary | ICD-10-CM | POA: Diagnosis not present

## 2024-01-03 DIAGNOSIS — E1169 Type 2 diabetes mellitus with other specified complication: Secondary | ICD-10-CM | POA: Diagnosis not present

## 2024-01-03 DIAGNOSIS — Z794 Long term (current) use of insulin: Secondary | ICD-10-CM

## 2024-01-03 LAB — POCT GLYCOSYLATED HEMOGLOBIN (HGB A1C): Hemoglobin A1C: 7.8 % — AB (ref 4.0–5.6)

## 2024-01-03 MED ORDER — INSULIN ASPART 100 UNIT/ML FLEXPEN: 0.0000 [IU] | PEN_INJECTOR | Freq: Three times a day (TID) | SUBCUTANEOUS | 2 refills | Status: AC

## 2024-01-03 MED ORDER — REZVOGLAR KWIKPEN 100 UNIT/ML ~~LOC~~ SOPN: 15.0000 [IU] | PEN_INJECTOR | Freq: Two times a day (BID) | SUBCUTANEOUS | 2 refills | Status: AC

## 2024-01-03 MED ORDER — DEXCOM G7 SENSOR MISC: 3 refills | Status: DC

## 2024-01-03 NOTE — Telephone Encounter (Signed)
 Pharmacy Patient Advocate Encounter   Received notification from Onbase that prior authorization for Rezvoglar  kwikpen 100 is required/requested.   Insurance verification completed.   The patient is insured through [facility] .   Per test claim: The current 30 day co-pay is, $0.00.  No PA needed at this time. This test claim was processed through [facility]- copay amounts may vary at other pharmacies due to pharmacy/plan contracts, or as the patient moves through the different stages of their insurance plan.

## 2024-01-03 NOTE — Patient Instructions (Signed)
 Take the novolog  PRIOR to meals-  Increase the Rezvoglar  to 15 units twice daily  A1C today improved to 7.8% (goal < 7)  Set up 3 month follow up.

## 2024-01-03 NOTE — Progress Notes (Signed)
 Established Patient Office Visit  Subjective   Patient ID: Richard Carroll, male    DOB: 1970-10-09  Age: 53 y.o. MRN: [id number]  Chief Complaint  Patient presents with   Medical Management of Chronic Issues    HPI   Richard Carroll is seen for routine medical follow-up.  He has history of Crohn's colitis which has recently been stable symptomatically.  He had recent fecal calprotectin level 105.  Remains on Lialda  and followed by GI.  Type 2 diabetes with history of poor control and hyperglycemia.  Currently on Trulicity  3 mg along with Jardiance .  His insulin  regimen is NovoLog  on sliding scale but has been taking this apparently after meals rather than prior.  His current dosage of Basaglar  insulin  is 12 units twice daily.  He has continuous glucose monitor.  No recent hypoglycemia.  Recent paronychia left great toe.  He had drainage in office and started on doxycycline  and remains on this.  Slowly improving but still some mild redness.  No fever.  Hyperlipidemia treated with rosuvastatin  20 mg daily.  No myalgias.  Blood pressure has been stable.  Past Medical History:  Diagnosis Date   Arthritis    Crohn's colitis, with rectal bleeding (HCC) 10/22/2022   Diabetes mellitus type II    DIABETES MELLITUS, TYPE II 08/18/2009   on meds   Hay fever    with allergies   Hyperlipidemia    HYPERLIPIDEMIA 08/18/2009   on meds   Seasonal allergies    Past Surgical History:  Procedure Laterality Date   BIOPSY  10/25/2022   Procedure: BIOPSY;  Surgeon: Richard Gwendlyn ONEIDA, MD;  Location: [facility];  Service: Gastroenterology;;   COLONOSCOPY  01/2021   COLONOSCOPY WITH PROPOFOL  N/A 10/25/2022   Procedure: COLONOSCOPY WITH PROPOFOL ;  Surgeon: Richard Gwendlyn ONEIDA, MD;  Location: [facility];  Service: Gastroenterology;  Laterality: N/A;   FINGER SURGERY Right    ring finger   KNEE SURGERY Right 2005    reports that he has never smoked. His smokeless tobacco use includes chew. He reports that  he does not currently use alcohol. He reports that he does not use drugs. family history includes Alcohol abuse in his maternal grandfather; Colon cancer in his paternal uncle; Colon cancer (age of onset: 24) in his father; Diabetes in his maternal grandmother and mother; Heart disease in his father; Hyperlipidemia in his mother; Hypertension in his mother; Prostate cancer in his paternal uncle; Prostate cancer (age of onset: 35) in his father; Stroke in his mother. No Known Allergies  Review of Systems  Constitutional:  Negative for malaise/fatigue.  Eyes:  Negative for blurred vision.  Respiratory:  Negative for shortness of breath.   Cardiovascular:  Negative for chest pain.  Neurological:  Negative for dizziness, weakness and headaches.      Objective:     BP 116/80   Pulse 77   Temp 98.3 F (36.8 C) (Oral)   Wt 207 lb 12.8 oz (94.3 kg)   SpO2 97%   BMI 29.82 kg/m  BP Readings from Last 3 Encounters:  01/03/24 116/80  12/28/23 110/70  10/04/23 (!) 127/91   Wt Readings from Last 3 Encounters:  01/03/24 207 lb 12.8 oz (94.3 kg)  12/28/23 209 lb (94.8 kg)  10/04/23 213 lb (96.6 kg)      Physical Exam Vitals reviewed.  Constitutional:      General: He is not in acute distress.    Appearance: He is well-developed. He is not ill-appearing.  HENT:     Right Ear: External ear normal.     Left Ear: External ear normal.  Eyes:     Pupils: Pupils are equal, round, and reactive to light.  Neck:     Thyroid: No thyromegaly.  Cardiovascular:     Rate and Rhythm: Normal rate and regular rhythm.  Pulmonary:     Effort: Pulmonary effort is normal. No respiratory distress.     Breath sounds: Normal breath sounds. No wheezing or rales.  Musculoskeletal:     Cervical back: Neck supple.  Skin:    Comments: Left great toe reveals some very mild erythema near the base the nail but no visible purulence.  He has very small eschar at area of drainage distal medial aspect of the toe.   No tenderness.  No fluctuance.  Neurological:     Mental Status: He is alert and oriented to person, place, and time.      Results for orders placed or performed in visit on 01/03/24  POC HgB A1c  Result Value Ref Range   Hemoglobin A1C 7.8 (A) 4.0 - 5.6 %   HbA1c POC (<> result, manual entry)     HbA1c, POC (prediabetic range)     HbA1c, POC (controlled diabetic range)      Last CBC Lab Results  Component Value Date   WBC 7.2 08/17/2023   HGB 16.3 08/17/2023   HCT 48.4 08/17/2023   MCV 91.6 08/17/2023   MCH 30.8 12/12/2022   RDW 13.2 08/17/2023   PLT 246.0 08/17/2023   Last metabolic panel Lab Results  Component Value Date   GLUCOSE 100 (H) 08/17/2023   NA 139 08/17/2023   K 4.3 08/17/2023   CL 103 08/17/2023   CO2 29 08/17/2023   BUN 17 08/17/2023   CREATININE 1.04 08/17/2023   GFR 83.15 08/17/2023   CALCIUM  9.2 08/17/2023   PHOS 3.1 10/27/2022   PROT 6.8 05/24/2023   ALBUMIN 4.2 05/24/2023   BILITOT 0.5 05/24/2023   ALKPHOS 65 05/24/2023   AST 20 05/24/2023   ALT 30 05/24/2023   ANIONGAP 9 12/12/2022   Last lipids Lab Results  Component Value Date   CHOL 121 05/24/2023   HDL 45.70 05/24/2023   LDLCALC 61 05/24/2023   LDLDIRECT 113.0 01/13/2018   TRIG 69.0 05/24/2023   CHOLHDL 3 05/24/2023   Last hemoglobin A1c Lab Results  Component Value Date   HGBA1C 7.8 (A) 01/03/2024      The ASCVD Risk score (Arnett DK, et al., 2019) failed to calculate for the following reasons:   The valid total cholesterol range is 130 to 320 mg/dL    Assessment & Plan:    #1 type 2 diabetes gradually improving but not to goal.  A1c today 7.8%.  We suggested first that he be sure to take the NovoLog  either just prior to meals within a few minutes or at the beginning of meals but not after.  He has recently been taking his NovoLog  about an hour after meals.  We also suggested increasing his Basaglar  insulin  to 15 units twice daily with refills given.  Set up 58-month  follow-up  #2 paronychia left great toe improving on doxycycline  and following recent drainage.  Follow-up for any persistent erythema or other concerns  #3 hyperlipidemia treated with rosuvastatin  20 mg daily.  Lipids were checked several months ago and stable.  Denies any myalgias.  Continue current dosage and low saturated fat diet  Recommended flu vaccine and patient declines  Return in about 3 months (around 04/03/2024).    Wolm Scarlet, MD

## 2024-01-16 ENCOUNTER — Ambulatory Visit (INDEPENDENT_AMBULATORY_CARE_PROVIDER_SITE_OTHER): Admitting: Family Medicine

## 2024-01-16 ENCOUNTER — Encounter: Payer: Self-pay | Admitting: Family Medicine

## 2024-01-16 ENCOUNTER — Ambulatory Visit: Payer: Self-pay

## 2024-01-16 VITALS — BP 134/70 | HR 88 | Temp 97.4°F | Wt 208.7 lb

## 2024-01-16 DIAGNOSIS — L601 Onycholysis: Secondary | ICD-10-CM

## 2024-01-16 DIAGNOSIS — L853 Xerosis cutis: Secondary | ICD-10-CM

## 2024-01-16 MED ORDER — MUPIROCIN 2 % EX OINT: 1.0000 | TOPICAL_OINTMENT | Freq: Two times a day (BID) | CUTANEOUS | 1 refills | Status: AC

## 2024-01-16 MED ORDER — TRIAMCINOLONE ACETONIDE 0.5 % EX CREA: 1.0000 | TOPICAL_CREAM | Freq: Two times a day (BID) | CUTANEOUS | 1 refills | Status: AC

## 2024-01-16 NOTE — Patient Instructions (Signed)
 Clean feet good daily with soap and water  Use the topical Bactroban ointment once daily- eg after showering.  Watch for any progressive redness or toe swelling.

## 2024-01-16 NOTE — Telephone Encounter (Signed)
 Reason for Triage: Patient called in reporting an infection in his big toe. He mentioned there is some pus and light bleeding. He was previously seen at his clinic by another provider (as his PCP was unavailable at the time) and was prescribed antibiotics. However, the patient stated that the issue is still ongoing.   Calback: [phone]    Reason for Disposition . Localized redness and swelling of skin around nail (i.e., cuticle area or nail fold)  Answer Assessment - Initial Assessment Questions Toe infection still ongoing after 9/24 appt. It looks like a dark color under nail bed. Maybe blood? I finished antibiotics.  I work on my feet all day and by end of day it's hurting.    1. ONSET: When did the pain start?      Several weeks  2. LOCATION: Where is the pain located?   (e.g., around nail, entire toe, at foot joint)      Left big toe 3. PAIN: How bad is the pain?    (Scale 1-10; or mild, moderate, severe)     mild 4. APPEARANCE: What does the toe look like? (e.g., redness, swelling, bruising, pallor)     Red, skin is peeling 5. CAUSE: What do you think is causing the toe pain?     Previous infection  6. OTHER SYMPTOMS: Do you have any other symptoms? (e.g., leg pain, rash, fever, numbness)     denies  Protocols used: Toe Pain-A-AH

## 2024-01-16 NOTE — Progress Notes (Signed)
 Established Patient Office Visit  Subjective   Patient ID: Richard Carroll, male    DOB: 10/26/1970  Age: 53 y.o. MRN: [id number]  Chief Complaint  Patient presents with   Nail Problem    HPI    Richard Carroll has history of type 2 diabetes.  He had recent paronychia left great toe with in office drainage procedure and started on doxycycline .  Seen today with some mild irritation of the left great toe.  Has not seen any drainage.  No fevers or chills.  He also complains of dry skin involving the hands especially right hand.  Has tried moisturizers without much improvement.  Middle finger reveals what looks like chronic paronychia changes.  Frequently has itching of the hands.  Past Medical History:  Diagnosis Date   Arthritis    Crohn's colitis, with rectal bleeding (HCC) 10/22/2022   Diabetes mellitus type II    DIABETES MELLITUS, TYPE II 08/18/2009   on meds   Hay fever    with allergies   Hyperlipidemia    HYPERLIPIDEMIA 08/18/2009   on meds   Seasonal allergies    Past Surgical History:  Procedure Laterality Date   BIOPSY  10/25/2022   Procedure: BIOPSY;  Surgeon: Richard Gwendlyn ONEIDA, MD;  Location: [facility];  Service: Gastroenterology;;   COLONOSCOPY  01/2021   COLONOSCOPY WITH PROPOFOL  N/A 10/25/2022   Procedure: COLONOSCOPY WITH PROPOFOL ;  Surgeon: Richard Gwendlyn ONEIDA, MD;  Location: [facility];  Service: Gastroenterology;  Laterality: N/A;   FINGER SURGERY Right    ring finger   KNEE SURGERY Right 2005    reports that he has never smoked. His smokeless tobacco use includes chew. He reports that he does not currently use alcohol. He reports that he does not use drugs. family history includes Alcohol abuse in his maternal grandfather; Colon cancer in his paternal uncle; Colon cancer (age of onset: 46) in his father; Diabetes in his maternal grandmother and mother; Heart disease in his father; Hyperlipidemia in his mother; Hypertension in his mother; Prostate cancer in his  paternal uncle; Prostate cancer (age of onset: 75) in his father; Stroke in his mother. No Known Allergies  Review of Systems  Constitutional:  Negative for chills and fever.  Skin:  Positive for itching and rash.      Objective:     BP 134/70   Pulse 88   Temp (!) 97.4 F (36.3 C) (Oral)   Wt 208 lb 11.2 oz (94.7 kg)   SpO2 95%   BMI 29.95 kg/m  BP Readings from Last 3 Encounters:  01/16/24 134/70  01/03/24 116/80  12/28/23 110/70   Wt Readings from Last 3 Encounters:  01/16/24 208 lb 11.2 oz (94.7 kg)  01/03/24 207 lb 12.8 oz (94.3 kg)  12/28/23 209 lb (94.8 kg)      Physical Exam Vitals reviewed.  Constitutional:      General: He is not in acute distress.    Appearance: He is not ill-appearing.  Cardiovascular:     Rate and Rhythm: Normal rate and regular rhythm.  Skin:    Comments: Right hand reveals very dry skin and a couple patches that are scaly especially involving the dorsum of the hand.  No vesicles  Left great toe reveals resolution of previous cellulitis changes.  No visible paronychia.  He does have some onycholysis and separation of the nail from underlying tissue on the border next to the second toe.  No discoloration.  Neurological:  Mental Status: He is alert.      No results found for any visits on 01/16/24.  Last hemoglobin A1c Lab Results  Component Value Date   HGBA1C 7.8 (A) 01/03/2024      The ASCVD Risk score (Arnett DK, et al., 2019) failed to calculate for the following reasons:   The valid total cholesterol range is 130 to 320 mg/dL    Assessment & Plan:   #1 dry skin dermatitis right hand.  We discussed trying to keep hands out of water is much as possible.  Triamcinolone  0.5% cream to use twice daily as needed  #2 recent paronychia left great toe.  He has some onycholysis type changes involving the toenail but no evidence for cellulitis or persistent paronychia at this time.  Clean toe daily with soap and water.   Thoroughly dry.  We did give some Bactroban ointment to use around the base of the nail.  Watch for any Pseudomonas infections with the onycholysis.  Did discuss possible podiatry referral at some point he will consider  Richard Scarlet, MD

## 2024-01-16 NOTE — Telephone Encounter (Signed)
 FYI Only or Action Required?: Action required by provider: request for appointment.  Patient was last seen in primary care on 01/03/2024 by Micheal Wolm ORN, MD.  Called Nurse Triage reporting Toe Pain.  Symptoms began about a month ago.  Interventions attempted: Nothing.  Symptoms are: unchanged.  Triage Disposition: See PCP When Office is Open (Within 3 Days)  Patient/caregiver understands and will follow disposition?: Yes

## 2024-01-25 ENCOUNTER — Telehealth: Payer: Self-pay

## 2024-01-25 ENCOUNTER — Other Ambulatory Visit (HOSPITAL_COMMUNITY): Payer: Self-pay

## 2024-01-25 NOTE — Telephone Encounter (Signed)
 Pharmacy Patient Advocate Encounter   Received notification from Onbase that prior authorization for Semglee  100 is required/requested.   Insurance verification completed.   The patient is insured through [employer].   Per test claim: PA required; PA submitted to above mentioned insurance via Latent Key/confirmation #/[id number] Status is pending

## 2024-01-30 ENCOUNTER — Other Ambulatory Visit (HOSPITAL_COMMUNITY): Payer: Self-pay

## 2024-01-30 NOTE — Telephone Encounter (Signed)
 Pharmacy Patient Advocate Encounter  Received notification from [employer] that Prior Authorization for Semglee  100 has been APPROVED from 01/25/24 to 01/04/25. Ran test claim, Copay is $0.00. This test claim was processed through [facility]- copay amounts may vary at other pharmacies due to pharmacy/plan contracts, or as the patient moves through the different stages of their insurance plan.   PA #/Case ID/Reference #: # [id number]

## 2024-02-10 ENCOUNTER — Ambulatory Visit (INDEPENDENT_AMBULATORY_CARE_PROVIDER_SITE_OTHER): Admitting: Gastroenterology

## 2024-02-10 ENCOUNTER — Encounter: Payer: Self-pay | Admitting: Gastroenterology

## 2024-02-10 VITALS — BP 142/94 | HR 88 | Ht 70.0 in | Wt 209.1 lb

## 2024-02-10 DIAGNOSIS — Z800 Family history of malignant neoplasm of digestive organs: Secondary | ICD-10-CM | POA: Diagnosis not present

## 2024-02-10 DIAGNOSIS — K50119 Crohn's disease of large intestine with unspecified complications: Secondary | ICD-10-CM | POA: Diagnosis not present

## 2024-02-10 DIAGNOSIS — Z860100 Personal history of colon polyps, unspecified: Secondary | ICD-10-CM

## 2024-02-10 DIAGNOSIS — M25511 Pain in right shoulder: Secondary | ICD-10-CM

## 2024-02-10 NOTE — Patient Instructions (Signed)
 _______________________________________________________  If your blood pressure at your visit was 140/90 or greater, please contact your primary care physician to follow up on this.  _______________________________________________________  If you are age 52 or older, your body mass index should be between 23-30. Your Body mass index is 30.01 kg/m. If this is out of the aforementioned range listed, please consider follow up with your Primary Care Provider.  If you are age 20 or younger, your body mass index should be between 19-25. Your Body mass index is 30.01 kg/m. If this is out of the aformentioned range listed, please consider follow up with your Primary Care Provider.   ________________________________________________________  The [facility] providers would like to encourage you to use MYCHART to communicate with providers for non-urgent requests or questions.  Due to long hold times on the telephone, sending your provider a message by [facility] may be a faster and more efficient way to get a response.  Please allow 48 business hours for a response.  Please remember that this is for non-urgent requests.  _______________________________________________________  [facility] is using a team-based approach to care.  Your team is made up of your doctor and two to three APPS. Our APPS (Nurse Practitioners and Physician Assistants) work with your physician to ensure care continuity for you. They are fully qualified to address your health concerns and develop a treatment plan. They communicate directly with your gastroenterologist to care for you. Seeing the Advanced Practice Practitioners on your physician's team can help you by facilitating care more promptly, often allowing for earlier appointments, access to diagnostic testing, procedures, and other specialty referrals.   It was a pleasure to see you today!  Vito Cirigliano, D.O.
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
## 2024-02-24 ENCOUNTER — Ambulatory Visit (INDEPENDENT_AMBULATORY_CARE_PROVIDER_SITE_OTHER): Admitting: Family Medicine

## 2024-02-24 ENCOUNTER — Encounter: Payer: Self-pay | Admitting: Family Medicine

## 2024-02-24 VITALS — BP 120/70 | HR 94 | Temp 97.8°F | Ht 70.0 in | Wt 206.7 lb

## 2024-02-24 DIAGNOSIS — L03011 Cellulitis of right finger: Secondary | ICD-10-CM

## 2024-02-24 MED ORDER — SULFAMETHOXAZOLE-TRIMETHOPRIM 800-160 MG PO TABS: 1.0000 | ORAL_TABLET | Freq: Two times a day (BID) | ORAL | 0 refills | Status: DC

## 2024-02-24 NOTE — Progress Notes (Signed)
 Acute Office Visit  Subjective:     Patient ID: Richard Carroll, male    DOB: [birth year], 53 y.o.   MRN: [id number]  Chief Complaint  Patient presents with   Mass    Patient complains of bump left index finger x4 days, states clear fluid came out after pressing the area, difficulty using hand for work, swelling and redness worsening since     HPI Discussed the use of AI scribe software for clinical note transcription with the patient, who gave verbal consent to proceed.  History of Present Illness   Richard Carroll is a 53 year old male with Crohn's disease who presents with a swollen and painful finger due to a possible infection.  He noticed a bump on his finger, initially thought to be a pimple. After popping it, swelling and redness increased, preventing him from bending his hand. The swelling affects his work, especially tasks requiring hand use. He is on light duty due to a shoulder injury, and the hand issue further limits his work capacity.  He has Crohn's disease and has experienced abscesses since diagnosis, including one on the back of his head that required I and D. He is not taking antibiotics and has no known allergies to them. [facility] uses the [facility] on [address] for prescriptions.  No fever or chills. No significant drainage from the finger after the initial popping.       Review of Systems  All other systems reviewed and are negative.       Objective:    BP 120/70   Pulse 94   Temp 97.8 F (36.6 C) (Oral)   Ht 5' 10 (1.778 m)   Wt 206 lb 11.2 oz (93.8 kg)   SpO2 98%   BMI 29.66 kg/m    Physical Exam Vitals reviewed.  Constitutional:      Appearance: Normal appearance. He is normal weight.  Cardiovascular:     Pulses: Normal pulses.  Pulmonary:     Effort: Pulmonary effort is normal.  Musculoskeletal:     Right hand: Tenderness (with erythema of the index finger, with erythema spreading over the dorsal aspect of his hand)  present. Decreased range of motion (first and second digits due to swelling).     Left hand: Normal.  Neurological:     Mental Status: He is alert.     No results found for any visits on 02/24/24.      Assessment & Plan:   Problem List Items Addressed This Visit   None Visit Diagnoses       Cellulitis of finger of right hand    -  Primary   Relevant Medications   sulfamethoxazole -trimethoprim  (BACTRIM  DS) 800-160 MG tablet     Assessment and Plan    Cellulitis of right finger Cellulitis of the right finger with significant swelling and redness, likely secondary to a popped pimple. No abscess detected upon examination. Painful and limits hand function, affecting work duties. - Prescribed Bactrim  (sulfamethoxazole  and trimethoprim ) 1 tablet twice daily for 7 days. - Advised taking the first dose immediately upon pharmacy pickup and a second dose tonight. - Instructed to remain off work from today through Sunday to allow for swelling reduction and hand function improvement. - Recommended hot compresses on the affected finger for 20 minutes on and 20 minutes off to promote drainage if an abscess develops. - Advised taking antibiotics with food to minimize gastrointestinal side effects such as nausea and diarrhea. - Instructed  to report any worsening symptoms, such as increased fever or chills, or if antibiotics are ineffective. - Provided a work excuse for missed work days.        Meds ordered this encounter  Medications   sulfamethoxazole -trimethoprim  (BACTRIM  DS) 800-160 MG tablet    Sig: Take 1 tablet by mouth 2 (two) times daily for 7 days.    Dispense:  14 tablet    Refill:  0    Return if symptoms worsen or fail to improve.  Heron CHRISTELLA Sharper, MD

## 2024-02-26 ENCOUNTER — Emergency Department (HOSPITAL_COMMUNITY)

## 2024-02-26 ENCOUNTER — Observation Stay (HOSPITAL_COMMUNITY)
Admission: EM | Admit: 2024-02-26 | Discharge: 2024-02-28 | Disposition: A | Discharge status: Home / Self Care | Attending: Internal Medicine | Admitting: Internal Medicine

## 2024-02-26 ENCOUNTER — Other Ambulatory Visit: Payer: Self-pay

## 2024-02-26 ENCOUNTER — Encounter (HOSPITAL_COMMUNITY): Payer: Self-pay

## 2024-02-26 DIAGNOSIS — K5090 Crohn's disease, unspecified, without complications: Secondary | ICD-10-CM | POA: Diagnosis present

## 2024-02-26 DIAGNOSIS — L02511 Cutaneous abscess of right hand: Secondary | ICD-10-CM | POA: Diagnosis not present

## 2024-02-26 DIAGNOSIS — Z8614 Personal history of Methicillin resistant Staphylococcus aureus infection: Secondary | ICD-10-CM

## 2024-02-26 DIAGNOSIS — F17220 Nicotine dependence, chewing tobacco, uncomplicated: Secondary | ICD-10-CM | POA: Diagnosis present

## 2024-02-26 DIAGNOSIS — M7989 Other specified soft tissue disorders: Secondary | ICD-10-CM | POA: Diagnosis not present

## 2024-02-26 DIAGNOSIS — A419 Sepsis, unspecified organism: Secondary | ICD-10-CM | POA: Diagnosis not present

## 2024-02-26 DIAGNOSIS — E1165 Type 2 diabetes mellitus with hyperglycemia: Secondary | ICD-10-CM | POA: Diagnosis present

## 2024-02-26 DIAGNOSIS — E785 Hyperlipidemia, unspecified: Secondary | ICD-10-CM | POA: Diagnosis not present

## 2024-02-26 DIAGNOSIS — Z7984 Long term (current) use of oral hypoglycemic drugs: Secondary | ICD-10-CM

## 2024-02-26 DIAGNOSIS — L03011 Cellulitis of right finger: Principal | ICD-10-CM | POA: Diagnosis present

## 2024-02-26 DIAGNOSIS — Z79899 Other long term (current) drug therapy: Secondary | ICD-10-CM

## 2024-02-26 DIAGNOSIS — E1169 Type 2 diabetes mellitus with other specified complication: Secondary | ICD-10-CM | POA: Diagnosis not present

## 2024-02-26 DIAGNOSIS — Z7985 Long-term (current) use of injectable non-insulin antidiabetic drugs: Secondary | ICD-10-CM

## 2024-02-26 DIAGNOSIS — L0390 Cellulitis, unspecified: Secondary | ICD-10-CM

## 2024-02-26 DIAGNOSIS — Z8249 Family history of ischemic heart disease and other diseases of the circulatory system: Secondary | ICD-10-CM

## 2024-02-26 DIAGNOSIS — Z794 Long term (current) use of insulin: Secondary | ICD-10-CM | POA: Diagnosis not present

## 2024-02-26 DIAGNOSIS — Z833 Family history of diabetes mellitus: Secondary | ICD-10-CM

## 2024-02-26 LAB — CBC WITH DIFFERENTIAL/PLATELET
Abs Immature Granulocytes: 0.05 K/uL (ref 0.00–0.07)
Basophils Absolute: 0 K/uL (ref 0.0–0.1)
Basophils Relative: 0 %
Eosinophils Absolute: 0.3 K/uL (ref 0.0–0.5)
Eosinophils Relative: 3 %
HCT: 52.6 % — ABNORMAL HIGH (ref 39.0–52.0)
Hemoglobin: 18 g/dL — ABNORMAL HIGH (ref 13.0–17.0)
Immature Granulocytes: 0 %
Lymphocytes Relative: 17 %
Lymphs Abs: 2.2 K/uL (ref 0.7–4.0)
MCH: 30.5 pg (ref 26.0–34.0)
MCHC: 34.2 g/dL (ref 30.0–36.0)
MCV: 89.2 fL (ref 80.0–100.0)
Monocytes Absolute: 1.1 K/uL — ABNORMAL HIGH (ref 0.1–1.0)
Monocytes Relative: 8 %
Neutro Abs: 9.2 K/uL — ABNORMAL HIGH (ref 1.7–7.7)
Neutrophils Relative %: 72 %
Platelets: 260 K/uL (ref 150–400)
RBC: 5.9 MIL/uL — ABNORMAL HIGH (ref 4.22–5.81)
RDW: 12.4 % (ref 11.5–15.5)
WBC: 12.8 K/uL — ABNORMAL HIGH (ref 4.0–10.5)
nRBC: 0 % (ref 0.0–0.2)

## 2024-02-26 LAB — COMPREHENSIVE METABOLIC PANEL WITH GFR
ALT: 30 U/L (ref 0–44)
AST: 18 U/L (ref 15–41)
Albumin: 4.4 g/dL (ref 3.5–5.0)
Alkaline Phosphatase: 124 U/L (ref 38–126)
Anion gap: 12 (ref 5–15)
BUN: 15 mg/dL (ref 6–20)
CO2: 22 mmol/L (ref 22–32)
Calcium: 9.9 mg/dL (ref 8.9–10.3)
Chloride: 102 mmol/L (ref 98–111)
Creatinine, Ser: 1.08 mg/dL (ref 0.61–1.24)
GFR, Estimated: >60 mL/min (ref >60)
Glucose, Bld: 185 mg/dL — ABNORMAL HIGH (ref 70–99)
Potassium: 3.9 mmol/L (ref 3.5–5.1)
Sodium: 136 mmol/L (ref 135–145)
Total Bilirubin: 0.4 mg/dL (ref 0.0–1.2)
Total Protein: 7.8 g/dL (ref 6.5–8.1)

## 2024-02-26 LAB — GLUCOSE, CAPILLARY
Glucose-Capillary: 132 mg/dL — ABNORMAL HIGH (ref 70–99)
Glucose-Capillary: 176 mg/dL — ABNORMAL HIGH (ref 70–99)

## 2024-02-26 LAB — I-STAT CG4 LACTIC ACID, ED: Lactic Acid, Venous: 1.8 mmol/L (ref 0.5–1.9)

## 2024-02-26 MED ORDER — ALBUTEROL SULFATE (2.5 MG/3ML) 0.083% IN NEBU: 2.5000 mg | INHALATION_SOLUTION | RESPIRATORY_TRACT | Status: DC | PRN

## 2024-02-26 MED ORDER — ONDANSETRON HCL 4 MG PO TABS: 4.0000 mg | ORAL_TABLET | Freq: Four times a day (QID) | ORAL | Status: DC | PRN

## 2024-02-26 MED ORDER — MESALAMINE 1.2 G PO TBEC
2.4000 g | DELAYED_RELEASE_TABLET | Freq: Two times a day (BID) | ORAL | Status: DC
  Administered 2024-02-27 – 2024-02-28 (×2): 2.4 g via ORAL
  Filled 2024-02-26 (×4): qty 2

## 2024-02-26 MED ORDER — ACETAMINOPHEN 325 MG PO TABS
650.0000 mg | ORAL_TABLET | Freq: Four times a day (QID) | ORAL | Status: DC | PRN
  Administered 2024-02-26: 650 mg via ORAL
  Filled 2024-02-26: qty 2

## 2024-02-26 MED ORDER — OXYCODONE HCL 5 MG PO TABS
5.0000 mg | ORAL_TABLET | ORAL | Status: DC | PRN
  Administered 2024-02-26: 5 mg via ORAL
  Filled 2024-02-26: qty 1

## 2024-02-26 MED ORDER — OXYCODONE HCL 5 MG PO TABS
5.0000 mg | ORAL_TABLET | ORAL | Status: DC | PRN
  Administered 2024-02-26 – 2024-02-27 (×5): 5 mg via ORAL
  Filled 2024-02-26 (×5): qty 1

## 2024-02-26 MED ORDER — INSULIN GLARGINE-AGLR 100 UNIT/ML ~~LOC~~ SOPN: 15.0000 [IU] | PEN_INJECTOR | Freq: Two times a day (BID) | SUBCUTANEOUS | Status: DC

## 2024-02-26 MED ORDER — ENOXAPARIN SODIUM 40 MG/0.4ML IJ SOSY
40.0000 mg | PREFILLED_SYRINGE | INTRAMUSCULAR | Status: DC
  Administered 2024-02-26 – 2024-02-27 (×2): 40 mg via SUBCUTANEOUS
  Filled 2024-02-26 (×2): qty 0.4

## 2024-02-26 MED ORDER — INSULIN ASPART 100 UNIT/ML IJ SOLN
0.0000 [IU] | Freq: Three times a day (TID) | INTRAMUSCULAR | Status: DC
  Administered 2024-02-26 – 2024-02-27 (×2): 2 [IU] via SUBCUTANEOUS
  Administered 2024-02-27: 8 [IU] via SUBCUTANEOUS
  Administered 2024-02-27: 5 [IU] via SUBCUTANEOUS
  Administered 2024-02-28: 2 [IU] via SUBCUTANEOUS
  Filled 2024-02-26: qty 5
  Filled 2024-02-26: qty 2
  Filled 2024-02-26: qty 1
  Filled 2024-02-26: qty 2
  Filled 2024-02-26: qty 8
  Filled 2024-02-26: qty 2

## 2024-02-26 MED ORDER — SODIUM CHLORIDE 0.9 % IV SOLN
3.0000 g | Freq: Once | INTRAVENOUS | Status: AC
  Administered 2024-02-26: 3 g via INTRAVENOUS
  Filled 2024-02-26: qty 8

## 2024-02-26 MED ORDER — ONDANSETRON HCL 4 MG/2ML IJ SOLN: 4.0000 mg | Freq: Four times a day (QID) | INTRAMUSCULAR | Status: DC | PRN

## 2024-02-26 MED ORDER — ROSUVASTATIN CALCIUM 20 MG PO TABS
20.0000 mg | ORAL_TABLET | Freq: Every day | ORAL | Status: DC
  Administered 2024-02-27 – 2024-02-28 (×2): 20 mg via ORAL
  Filled 2024-02-26 (×2): qty 1

## 2024-02-26 MED ORDER — INSULIN ASPART 100 UNIT/ML IJ SOLN: 0.0000 [IU] | Freq: Every day | INTRAMUSCULAR | Status: DC

## 2024-02-26 MED ORDER — SODIUM CHLORIDE 0.9 % IV SOLN
3.0000 g | Freq: Four times a day (QID) | INTRAVENOUS | Status: DC
  Administered 2024-02-26 – 2024-02-27 (×4): 3 g via INTRAVENOUS
  Filled 2024-02-26 (×5): qty 8

## 2024-02-26 MED ORDER — MORPHINE SULFATE (PF) 2 MG/ML IV SOLN: 1.0000 mg | INTRAVENOUS | Status: DC | PRN

## 2024-02-26 MED ORDER — INSULIN GLARGINE-YFGN 100 UNIT/ML ~~LOC~~ SOLN
15.0000 [IU] | Freq: Two times a day (BID) | SUBCUTANEOUS | Status: DC
  Administered 2024-02-26 – 2024-02-28 (×4): 15 [IU] via SUBCUTANEOUS
  Filled 2024-02-26 (×5): qty 0.15

## 2024-02-26 MED ORDER — ACETAMINOPHEN 650 MG RE SUPP: 650.0000 mg | Freq: Four times a day (QID) | RECTAL | Status: DC | PRN

## 2024-02-26 NOTE — H&P (Signed)
 History and Physical  Richard Carroll FMW:[id number] DOB: 1971-04-05 DOA: 02/26/2024  PCP: Micheal Wolm ORN, MD   Chief Complaint: Right index finger cellulitis  HPI: Richard Carroll is a 53 y.o. 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  Patient mentioned that about 6 days ago he noticed a blister at the base above the knuckle of his right index finger.  Was more like a pimple, he took off the top, a very small amount of material came out.  Over the course of the ensuing few days, it became more red, swollen with a little bit of tenderness.  He went to family medicine on 11/21, was prescribed Bactrim  has taken the antibiotic since that day.  However, symptoms persisted and failed to improve and he came to the ER for evaluation.  Denies fevers, chills, nausea vomiting, chest pain, shortness of breath, or any other associated symptoms.  Review of Systems: Please see HPI for pertinent positives and negatives. A complete 10 system review of systems are otherwise negative.  Past Medical History:  Diagnosis Date   Arthritis    Crohn's colitis, with rectal bleeding (HCC) 10/22/2022   Diabetes mellitus type II    DIABETES MELLITUS, TYPE II 08/18/2009   on meds   Hay fever    with allergies   Hyperlipidemia    HYPERLIPIDEMIA 08/18/2009   on meds   Seasonal allergies    Past Surgical History:  Procedure Laterality Date   BIOPSY  10/25/2022   Procedure: BIOPSY;  Surgeon: Aneita Gwendlyn ONEIDA, MD;  Location: [facility];  Service: Gastroenterology;;   COLONOSCOPY  01/2021   COLONOSCOPY WITH PROPOFOL  N/A 10/25/2022   Procedure: COLONOSCOPY WITH PROPOFOL ;  Surgeon: Aneita Gwendlyn ONEIDA, MD;  Location: [facility];  Service: Gastroenterology;  Laterality: N/A;   FINGER SURGERY Right    ring finger   KNEE SURGERY Right 2005   Social History:   reports that he has never smoked. His smokeless tobacco use includes chew. He reports that he does not currently use alcohol. He reports that he does not use drugs.  No Known Allergies  Family History  Problem Relation Age of Onset   Hyperlipidemia Mother    Stroke Mother    Hypertension Mother    Diabetes Mother    Prostate cancer Father 26       2004   Colon cancer Father 58       2013   Heart disease Father    Diabetes Maternal Grandmother    Alcohol abuse Maternal Grandfather    Colon cancer Paternal Uncle    Prostate cancer Paternal Uncle    Colon polyps Neg Hx    Esophageal cancer Neg Hx    Stomach cancer Neg Hx    Rectal cancer Neg Hx      Prior to Admission medications   Medication Sig Start Date End Date Taking? Authorizing Provider  Continuous Glucose Sensor (DEXCOM G7 SENSOR) MISC CHANGE SENSOR EVERY 10 DAYS 01/03/24  Yes Burchette, Wolm ORN, MD  Dulaglutide  (TRULICITY ) 3 MG/0.5ML SOAJ Inject 3 mg into the skin once a week. 08/23/23  Yes Burchette, Wolm ORN, MD  empagliflozin  (JARDIANCE ) 10 MG TABS tablet TAKE 1 TABLET BY MOUTH DAILY BEFORE BREAKFAST. 11/11/23  Yes Burchette, Wolm ORN, MD  glucose blood (CONTOUR TEST) test strip Use as instructed 10/27/22  Yes Dahal, Binaya, MD  ibuprofen (ADVIL) 800 MG tablet  Take 800 mg by mouth 3 (three) times daily. 12/16/23  Yes [provider]  insulin  aspart (NOVOLOG ) 100 UNIT/ML FlexPen Inject 0-6 Units into the skin 3 (three) times daily with meals. Check Blood Glucose (BG) and inject per scale: BG <150= 0 unit; BG 150-200= 1 unit; BG 201-250= 2 unit; BG 251-300= 3 unit; BG 301-350= 4 unit; BG 351-400= 5 unit; BG >400= 6 unit and Call Primary Care. 01/03/24  Yes Burchette, Wolm ORN, MD  Insulin  Glargine-aglr (REZVOGLAR  KWIKPEN) 100 UNIT/ML SOPN Inject 15 Units into the skin in the morning and at bedtime. 01/03/24  Yes Burchette, Wolm ORN, MD  Insulin  Pen Needle (B-D UF III MINI PEN NEEDLES) 31G X 5 MM MISC 1 EACH BY DOES NOT APPLY  ROUTE 3 (THREE) TIMES DAILY. MAY DISPENSE ANY MANUFACTURER COVERED BY PATIENT'S INSURANCE. 03/11/23  Yes Burchette, Wolm ORN, MD  Lancet Device MISC 1 each by Does not apply route 3 (three) times daily. May dispense any manufacturer covered by patient's insurance. 10/27/22  Yes Dahal, Chapman, MD  Lancets MISC 1 each by Does not apply route 3 (three) times daily. Use as directed to check blood sugar. May dispense any manufacturer covered by patient's insurance and fits patient's device. 10/27/22  Yes Dahal, Chapman, MD  mesalamine  (LIALDA ) 1.2 g EC tablet TAKE 2 TABLETS (2.4 G TOTAL) BY MOUTH 2 (TWO) TIMES DAILY WITH A MEAL. 12/16/23 03/15/24 Yes Cirigliano, Vito V, DO  Multiple Vitamin (ONE-A-DAY MENS PO) Take 1 tablet by mouth daily.   Yes [provider]  mupirocin  ointment (BACTROBAN ) 2 % Apply 1 Application topically 2 (two) times daily. 01/16/24  Yes Burchette, Wolm ORN, MD  rosuvastatin  (CRESTOR ) 20 MG tablet TAKE 1 TABLET BY MOUTH EVERY DAY 11/24/23  Yes Burchette, Wolm ORN, MD  sulfamethoxazole -trimethoprim  (BACTRIM  DS) 800-160 MG tablet Take 1 tablet by mouth 2 (two) times daily for 7 days. 02/24/24 03/02/24 Yes Ozell Heron HERO, MD  triamcinolone  cream (KENALOG ) 0.5 % Apply 1 Application topically 2 (two) times daily. Patient taking differently: Apply 1 Application topically as needed (Irritation). 01/16/24  Yes Burchette, Wolm ORN, MD  Blood Glucose Monitoring Suppl DEVI 1 each by Does not apply route 3 (three) times daily. May dispense any manufacturer covered by patient's insurance. Patient not taking: Reported on 02/26/2024 10/27/22   Dahal, Binaya, MD  clobetasol ointment (TEMOVATE) 0.05 % 1 application to affected area hands Externally once -Twice a day monday thru friday only when flared; Duration: 30 days Patient not taking: Reported on 02/26/2024 12/09/15   [provider]  cyclobenzaprine (FLEXERIL) 10 MG tablet Take 10 mg by mouth. Patient not taking: Reported on 02/26/2024  12/16/23   [provider]  Insulin  Glargine-aglr (REZVOGLAR  KWIKPEN) 100 UNIT/ML SOPN INJECT 8 UNITS INTO THE SKIN 2 (TWO) TIMES DAILY. Patient not taking: Reported on 02/26/2024 08/08/23   Micheal Wolm ORN, MD  sildenafil  (VIAGRA ) 100 MG tablet Take 0.5-1 tablets (50-100 mg total) by mouth daily as needed for erectile dysfunction. Patient not taking: Reported on 02/26/2024 01/19/19   Micheal Wolm ORN, MD  tobramycin  (TOBREX ) 0.3 % ophthalmic solution Place 2 drops into both eyes every 4 (four) hours. Patient not taking: Reported on 02/26/2024 09/12/23   Micheal Wolm ORN, MD    Physical Exam: BP (!) 145/97   Pulse (!) 114   Temp 98.5 F (36.9 C)   Resp 17   SpO2 98%  General:  Alert, oriented, calm, in no acute distress, looks comfortable and nontoxic.  His  wife is at the bedside. Eyes: EOMI, clear conjuctivae, white sclerea Neck: supple, no masses, trachea mildline  Cardiovascular: RRR, no murmurs or rubs, no peripheral edema  Respiratory: clear to auscultation bilaterally, no wheezes, no crackles  Abdomen: soft, nontender, nondistended, normal bowel tones heard  Skin: dry, no rashes  Musculoskeletal: no joint effusions, normal range of motion except limited in the right index finger due to swelling.  His entire hand is neurovascularly intact. Psychiatric: appropriate affect, normal speech  Neurologic: extraocular muscles intact, clear speech, moving all extremities with intact sensorium           Labs on Admission:  Basic Metabolic Panel: Recent Labs  Lab 02/26/24 1255  NA 136  K 3.9  CL 102  CO2 22  GLUCOSE 185*  BUN 15  CREATININE 1.08  CALCIUM  9.9   Liver Function Tests: Recent Labs  Lab 02/26/24 1255  AST 18  ALT 30  ALKPHOS 124  BILITOT 0.4  PROT 7.8  ALBUMIN 4.4   No results for input(s): LIPASE, AMYLASE in the last 168 hours. No results for input(s): AMMONIA in the last 168 hours. CBC: Recent Labs  Lab 02/26/24 1255  WBC 12.8*   NEUTROABS 9.2*  HGB 18.0*  HCT 52.6*  MCV 89.2  PLT 260   Cardiac Enzymes: No results for input(s): CKTOTAL, CKMB, CKMBINDEX, TROPONINI in the last 168 hours. BNP (last 3 results) No results for input(s): BNP in the last 8760 hours.  ProBNP (last 3 results) No results for input(s): PROBNP in the last 8760 hours.  CBG: No results for input(s): GLUCAP in the last 168 hours.  Radiological Exams on Admission: DG Hand Complete Right Result Date: 02/26/2024 EXAM: 3 OR MORE VIEW(S) XRAY OF THE RIGHT HAND 02/26/2024 12:37:00 PM COMPARISON: None available. CLINICAL HISTORY: First digit swelling at the MCP. FINDINGS: BONES AND JOINTS: No acute fracture. Chronic deformity involving the tuft of the 4th distal phalanx compatible with remote injury. No joint dislocation. SOFT TISSUES: The soft tissues are unremarkable. IMPRESSION: 1. No acute osseous abnormality related to the first digit swelling at the MCP. 2. Chronic deformity involving the tuft of the 4th distal phalanx compatible with remote injury. Electronically signed by: Waddell Calk MD 02/26/2024 12:41 PM EST RP Workstation: [id number]   Assessment/Plan  Richard Carroll is a 53 y.o. 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   Sepsis-meeting criteria with leukocytosis, tachycardia, source is right index finger cellulitis.  Lactic acid is normal, patient is hemodynamically stable.  No fluctuance on exam, drainage, x-ray without osseous abnormality gas or other evidence of abscess. -Observation admission -IV Unasyn  -Follow-up blood culture -Consider inpatient hand consult if not significantly improving in the next 24 hours  Insulin -dependent type 2 diabetes-last A1c was greater than 7 -Carb modified diet -Continue home basal bolus insulin  dosing -With sliding  scale  Hyperlipidemia-statin  DVT prophylaxis: Lovenox      Code Status: Full Code  Consults called: None  Admission status: Observation  Time spent: 49 minutes  Ora Mcnatt CHRISTELLA Gail MD [facility] Pager [phone]  If 7PM-7AM, please contact night-coverage [email or web address] Password [email or web address]  02/26/2024, 2:45 PM

## 2024-02-26 NOTE — ED Triage Notes (Signed)
 Pt arrives via POV. Pt reports he has had an abscess on his right index finger for the past several days. PT was started on Bactrim  Friday with no improvement. Area is red, swollen, and painful. He is AxOx4.

## 2024-02-26 NOTE — ED Provider Notes (Signed)
 [facility] EMERGENCY DEPARTMENT AT [facility] Provider Note   CSN: [id number] Arrival date & time: 02/26/24  1120     Patient presents with: Abscess   Richard Carroll is a 53 y.o. male presents for swelling of the MCP on the right hand, noted as a swelling to the MCP several days ago, presented to the primary care for which they prescribed him a course of Bactrim , has worsened despite this.  Has difficulty flexing the right finger however does not have tenderness on the palmar surface.    Abscess      Prior to Admission medications   Medication Sig Start Date End Date Taking? Authorizing Provider  Continuous Glucose Sensor (DEXCOM G7 SENSOR) MISC CHANGE SENSOR EVERY 10 DAYS 01/03/24  Yes Burchette, Wolm ORN, MD  Dulaglutide  (TRULICITY ) 3 MG/0.5ML SOAJ Inject 3 mg into the skin once a week. 08/23/23  Yes Burchette, Wolm ORN, MD  empagliflozin  (JARDIANCE ) 10 MG TABS tablet TAKE 1 TABLET BY MOUTH DAILY BEFORE BREAKFAST. 11/11/23  Yes Burchette, Wolm ORN, MD  glucose blood (CONTOUR TEST) test strip Use as instructed 10/27/22  Yes Dahal, Binaya, MD  ibuprofen (ADVIL) 800 MG tablet Take 800 mg by mouth 3 (three) times daily. 12/16/23  Yes [provider]  insulin  aspart (NOVOLOG ) 100 UNIT/ML FlexPen Inject 0-6 Units into the skin 3 (three) times daily with meals. Check Blood Glucose (BG) and inject per scale: BG <150= 0 unit; BG 150-200= 1 unit; BG 201-250= 2 unit; BG 251-300= 3 unit; BG 301-350= 4 unit; BG 351-400= 5 unit; BG >400= 6 unit and Call Primary Care. 01/03/24  Yes Burchette, Wolm ORN, MD  Insulin  Glargine-aglr (REZVOGLAR  KWIKPEN) 100 UNIT/ML SOPN Inject 15 Units into the skin in the morning and at bedtime. 01/03/24  Yes Burchette, Wolm ORN, MD  Insulin  Pen Needle (B-D UF III MINI PEN NEEDLES) 31G X 5 MM MISC 1 EACH BY DOES NOT APPLY ROUTE 3 (THREE) TIMES DAILY. MAY DISPENSE ANY MANUFACTURER COVERED BY PATIENT'S INSURANCE. 03/11/23  Yes Burchette, Wolm ORN, MD  Lancet  Device MISC 1 each by Does not apply route 3 (three) times daily. May dispense any manufacturer covered by patient's insurance. 10/27/22  Yes Dahal, Chapman, MD  Lancets MISC 1 each by Does not apply route 3 (three) times daily. Use as directed to check blood sugar. May dispense any manufacturer covered by patient's insurance and fits patient's device. 10/27/22  Yes Dahal, Chapman, MD  mesalamine  (LIALDA ) 1.2 g EC tablet TAKE 2 TABLETS (2.4 G TOTAL) BY MOUTH 2 (TWO) TIMES DAILY WITH A MEAL. 12/16/23 03/15/24 Yes Cirigliano, Vito V, DO  Multiple Vitamin (ONE-A-DAY MENS PO) Take 1 tablet by mouth daily.   Yes [provider]  mupirocin  ointment (BACTROBAN ) 2 % Apply 1 Application topically 2 (two) times daily. 01/16/24  Yes Burchette, Wolm ORN, MD  rosuvastatin  (CRESTOR ) 20 MG tablet TAKE 1 TABLET BY MOUTH EVERY DAY 11/24/23  Yes Burchette, Wolm ORN, MD  sulfamethoxazole -trimethoprim  (BACTRIM  DS) 800-160 MG tablet Take 1 tablet by mouth 2 (two) times daily for 7 days. 02/24/24 03/02/24 Yes Ozell Heron HERO, MD  triamcinolone  cream (KENALOG ) 0.5 % Apply 1 Application topically 2 (two) times daily. Patient taking differently: Apply 1 Application topically as needed (Irritation). 01/16/24  Yes Burchette, Wolm ORN, MD  Blood Glucose Monitoring Suppl DEVI 1 each by Does not apply route 3 (three) times daily. May dispense any manufacturer covered by patient's insurance. Patient not taking: Reported on 02/26/2024 10/27/22  Dahal, Binaya, MD  clobetasol ointment (TEMOVATE) 0.05 % 1 application to affected area hands Externally once -Twice a day monday thru friday only when flared; Duration: 30 days Patient not taking: Reported on 02/26/2024 12/09/15   [provider]  cyclobenzaprine (FLEXERIL) 10 MG tablet Take 10 mg by mouth. Patient not taking: Reported on 02/26/2024 12/16/23   [provider]  Insulin  Glargine-aglr (REZVOGLAR  KWIKPEN) 100 UNIT/ML SOPN INJECT 8 UNITS INTO THE SKIN 2 (TWO)  TIMES DAILY. Patient not taking: Reported on 02/26/2024 08/08/23   Micheal Wolm ORN, MD  sildenafil  (VIAGRA ) 100 MG tablet Take 0.5-1 tablets (50-100 mg total) by mouth daily as needed for erectile dysfunction. Patient not taking: Reported on 02/26/2024 01/19/19   Micheal Wolm ORN, MD  tobramycin  (TOBREX ) 0.3 % ophthalmic solution Place 2 drops into both eyes every 4 (four) hours. Patient not taking: Reported on 02/26/2024 09/12/23   Micheal Wolm ORN, MD    Allergies: Patient has no known allergies.    Review of Systems  Musculoskeletal:  Positive for arthralgias and joint swelling.  All other systems reviewed and are negative.   Updated Vital Signs BP (!) 145/97   Pulse (!) 114   Temp 98.5 F (36.9 C)   Resp 17   SpO2 98%   Physical Exam Vitals and nursing note reviewed.  Constitutional:      General: He is not in acute distress.    Appearance: He is well-developed.  HENT:     Head: Normocephalic and atraumatic.  Eyes:     Conjunctiva/sclera: Conjunctivae normal.  Cardiovascular:     Rate and Rhythm: Normal rate and regular rhythm.     Heart sounds: No murmur heard. Pulmonary:     Effort: Pulmonary effort is normal. No respiratory distress.     Breath sounds: Normal breath sounds.  Abdominal:     Palpations: Abdomen is soft.     Tenderness: There is no abdominal tenderness.  Musculoskeletal:        General: No swelling.     Right hand: Swelling and tenderness present.     Left hand: Normal.     Cervical back: Neck supple.     Comments: Swelling at the MCP of the left first digit with exquisite tenderness and decreased flexion secondary to pain.  Skin:    General: Skin is warm and dry.     Capillary Refill: Capillary refill takes less than 2 seconds.  Neurological:     Mental Status: He is alert.  Psychiatric:        Mood and Affect: Mood normal.     (all labs ordered are listed, but only abnormal results are displayed) Labs Reviewed  COMPREHENSIVE  METABOLIC PANEL WITH GFR - Abnormal; Notable for the following components:      Result Value   Glucose, Bld 185 (*)    All other components within normal limits  CBC WITH DIFFERENTIAL/PLATELET - Abnormal; Notable for the following components:   WBC 12.8 (*)    RBC 5.90 (*)    Hemoglobin 18.0 (*)    HCT 52.6 (*)    Neutro Abs 9.2 (*)    Monocytes Absolute 1.1 (*)    All other components within normal limits  CULTURE, BLOOD (ROUTINE X 2)  CULTURE, BLOOD (ROUTINE X 2)  I-STAT CG4 LACTIC ACID, ED    EKG: None  Radiology: DG Hand Complete Right Result Date: 02/26/2024 EXAM: 3 OR MORE VIEW(S) XRAY OF THE RIGHT HAND 02/26/2024 12:37:00 PM COMPARISON: None available.  CLINICAL HISTORY: First digit swelling at the MCP. FINDINGS: BONES AND JOINTS: No acute fracture. Chronic deformity involving the tuft of the 4th distal phalanx compatible with remote injury. No joint dislocation. SOFT TISSUES: The soft tissues are unremarkable. IMPRESSION: 1. No acute osseous abnormality related to the first digit swelling at the MCP. 2. Chronic deformity involving the tuft of the 4th distal phalanx compatible with remote injury. Electronically signed by: Waddell Calk MD 02/26/2024 12:41 PM EST RP Workstation: [id number]     Procedures   Medications Ordered in the ED  Insulin  Glargine-aglr SOPN 15 Units (has no administration in time range)  mesalamine  (LIALDA ) EC tablet 2.4 g (has no administration in time range)  rosuvastatin  (CRESTOR ) tablet 20 mg (has no administration in time range)  enoxaparin  (LOVENOX ) injection 40 mg (has no administration in time range)  acetaminophen  (TYLENOL ) tablet 650 mg (has no administration in time range)    Or  acetaminophen  (TYLENOL ) suppository 650 mg (has no administration in time range)  ondansetron  (ZOFRAN ) tablet 4 mg (has no administration in time range)    Or  ondansetron  (ZOFRAN ) injection 4 mg (has no administration in time range)  albuterol  (PROVENTIL ) (2.5  MG/3ML) 0.083% nebulizer solution 2.5 mg (has no administration in time range)  insulin  aspart (novoLOG ) injection 0-15 Units (has no administration in time range)  insulin  aspart (novoLOG ) injection 0-5 Units (has no administration in time range)  oxyCODONE  (Oxy IR/ROXICODONE ) immediate release tablet 5 mg (has no administration in time range)  Ampicillin -Sulbactam (UNASYN ) 3 g in sodium chloride  0.9 % 100 mL IVPB (has no administration in time range)  Ampicillin -Sulbactam (UNASYN ) 3 g in sodium chloride  0.9 % 100 mL IVPB (3 g Intravenous New Bag/Given 02/26/24 1321)                                    Medical Decision Making Amount and/or Complexity of Data Reviewed Labs: ordered. Radiology: ordered.  Risk Decision regarding hospitalization.   Medical Decision Making:   Richard Carroll is a 53 y.o. male who presented to the ED today with swelling and tenderness to the MCP of the right first digit detailed above.     Complete initial physical exam performed, notably the patient  was alert and oriented in no apparent distress.  There is firm swelling to the dorsal right first MCP.    Reviewed and confirmed nursing documentation for past medical history, family history, social history.    Initial Assessment:   With the patient's presentation of swelling tenderness to the dorsal right first MCP, most likely diagnosis is cellulitis.  [place] does include flexor tenosynovitis, osteomyelitis.  Initial Plan:  Plain film imaging of the right hand to evaluate for osteomyelitis Screening labs including CBC and Metabolic panel to evaluate for infectious or metabolic etiology of disease.  Obtain blood cultures to assess for bacteremia Evaluate lactic acid secondary to possible septic arthritis Objective evaluation as below reviewed   Initial Study Results:   Laboratory  All laboratory results reviewed without evidence of clinically relevant pathology.   Exceptions include:  Leukocytosis of 12.8 with neutrophilia of 9.2.  Does show a hematocrit of 52.6 with hemoglobin 18    Radiology:  All images reviewed independently. Agree with radiology report at this time.   DG Hand Complete Right Result Date: 02/26/2024 EXAM: 3 OR MORE VIEW(S) XRAY OF THE RIGHT HAND 02/26/2024 12:37:00 PM COMPARISON: None available. CLINICAL HISTORY:  First digit swelling at the MCP. FINDINGS: BONES AND JOINTS: No acute fracture. Chronic deformity involving the tuft of the 4th distal phalanx compatible with remote injury. No joint dislocation. SOFT TISSUES: The soft tissues are unremarkable. IMPRESSION: 1. No acute osseous abnormality related to the first digit swelling at the MCP. 2. Chronic deformity involving the tuft of the 4th distal phalanx compatible with remote injury. Electronically signed by: Waddell Calk MD 02/26/2024 12:41 PM EST RP Workstation: [id number]      Consults: Case discussed with Dr. Zella with the hospitalist team.   Reassessment and Plan:   Imaging does not demonstrate any osteomyelitis, and patient does not have tenderness along the flexor surface of the first digit of the right hand, so at this time do not believe this patient has flexor tenosynovitis nor osteomyelitis.  With worsening swelling to the dorsal first MCP of the right hand, fine this is likely worsening cellulitis that has failed outpatient therapy of Bactrim .  I have given him a dose of IV Unasyn  and connected with the hospitalist for admission for continued antibiotics and hand surgery consultation.  Discussed this with hospitalist as noted, they except for continued antibiotics and further management of his cellulitis.  This was discussed thoroughly with the patient which he understands and agrees with the need for admission at this time.       Final diagnoses:  Cellulitis of finger of right hand    ED Discharge Orders     None          Myriam Dorn BROCKS, PA 02/26/24 1507     Bari Roxie HERO, DO 03/01/24 1301

## 2024-02-27 ENCOUNTER — Telehealth: Payer: Self-pay | Admitting: *Deleted

## 2024-02-27 DIAGNOSIS — A419 Sepsis, unspecified organism: Secondary | ICD-10-CM | POA: Diagnosis not present

## 2024-02-27 DIAGNOSIS — L0390 Cellulitis, unspecified: Secondary | ICD-10-CM | POA: Diagnosis not present

## 2024-02-27 LAB — GLUCOSE, CAPILLARY
Glucose-Capillary: 125 mg/dL — ABNORMAL HIGH (ref 70–99)
Glucose-Capillary: 334 mg/dL — ABNORMAL HIGH (ref 70–99)
Glucose-Capillary: 260 mg/dL — ABNORMAL HIGH (ref 70–99)
Glucose-Capillary: 221 mg/dL — ABNORMAL HIGH (ref 70–99)
Glucose-Capillary: 165 mg/dL — ABNORMAL HIGH (ref 70–99)

## 2024-02-27 LAB — CBC
HCT: 48.4 % (ref 39.0–52.0)
Hemoglobin: 16.3 g/dL (ref 13.0–17.0)
MCH: 30.9 pg (ref 26.0–34.0)
MCHC: 33.7 g/dL (ref 30.0–36.0)
MCV: 91.8 fL (ref 80.0–100.0)
Platelets: 235 K/uL (ref 150–400)
RBC: 5.27 MIL/uL (ref 4.22–5.81)
RDW: 12.4 % (ref 11.5–15.5)
WBC: 9.9 K/uL (ref 4.0–10.5)
nRBC: 0 % (ref 0.0–0.2)

## 2024-02-27 LAB — BASIC METABOLIC PANEL WITH GFR
Anion gap: 10 (ref 5–15)
BUN: 16 mg/dL (ref 6–20)
CO2: 24 mmol/L (ref 22–32)
Calcium: 9 mg/dL (ref 8.9–10.3)
Chloride: 104 mmol/L (ref 98–111)
Creatinine, Ser: 0.94 mg/dL (ref 0.61–1.24)
GFR, Estimated: >60 mL/min (ref >60)
Glucose, Bld: 136 mg/dL — ABNORMAL HIGH (ref 70–99)
Potassium: 3.9 mmol/L (ref 3.5–5.1)
Sodium: 138 mmol/L (ref 135–145)

## 2024-02-27 LAB — HIV ANTIBODY (ROUTINE TESTING W REFLEX): HIV Screen 4th Generation wRfx: NONREACTIVE

## 2024-02-27 MED ORDER — VANCOMYCIN HCL 1500 MG/300ML IV SOLN
1500.0000 mg | Freq: Two times a day (BID) | INTRAVENOUS | Status: DC
  Administered 2024-02-27 – 2024-02-28 (×2): 1500 mg via INTRAVENOUS
  Filled 2024-02-27 (×3): qty 300

## 2024-02-27 NOTE — Plan of Care (Signed)

## 2024-02-27 NOTE — Progress Notes (Signed)
 Pharmacy Antibiotic Note  Richard Carroll is a 53 y.o. male admitted on 02/26/2024 with R index finger cellulitis and abscess.  Pharmacy has been consulted for Vanco dosing.  CC:  1 week of blistering on the base of the right index finger, taken Bactrim  from primary care physician without improvement. Pain, discomfort, burning. - ED: probed and deroofed the wound with some pus expressed, cultures collected and sent to the lab.   ID: R index finger cellulitis and abscess (h/o MRSA skin infection) Afebrile, WBC WNL, Scr <1  Unasyn  11/23 x 1 Vanco 11/24>>  Plan: Vancomycin  1500 mg IV Q 12 hrs. Goal AUC 400-550. Expected AUC: 534 SCr used: 0.94  Height: 5' 10 (177.8 cm) Weight: 93.8 kg (206 lb 11.2 oz) IBW/kg (Calculated) : 73  Temp (24hrs), Avg:98.1 F (36.7 C), Min:97.9 F (36.6 C), Max:98.3 F (36.8 C)  Recent Labs  Lab 02/26/24 1255 02/26/24 1304 02/27/24 0323  WBC 12.8*  --  9.9  CREATININE 1.08  --  0.94  LATICACIDVEN  --  1.8  --     Estimated Creatinine Clearance: 105.7 mL/min (by C-G formula based on SCr of 0.94 mg/dL).    No Known Allergies  Deidre Carino Karoline Marina, PharmD, BCPS Clinical Staff Pharmacist   Marina Salines Stillinger 02/27/2024 11:50 AM

## 2024-02-27 NOTE — Plan of Care (Signed)
  Problem: Pain Managment: Goal: General experience of comfort will improve and/or be controlled Outcome: Progressing   Problem: Safety: Goal: Ability to remain free from injury will improve Outcome: Progressing   Problem: Skin Integrity: Goal: Risk for impaired skin integrity will decrease Outcome: Progressing

## 2024-02-27 NOTE — Telephone Encounter (Signed)
 Fax received from Access Nurse dated 02/26/2024 at 9:35am stating the patient's right index finger has a black-purple color to it, sharp pain, swelling and cannot bend finger.  Fax placed in the red folder for Dr Ozell to review.

## 2024-02-27 NOTE — Telephone Encounter (Signed)
Chart shows patient currently admitted

## 2024-02-27 NOTE — Plan of Care (Signed)
   Problem: Coping: Goal: Ability to adjust to condition or change in health will improve Outcome: Progressing   Problem: Safety: Goal: Ability to remain free from injury will improve Outcome: Progressing

## 2024-02-27 NOTE — Consult Note (Signed)
 Reason for Consult:Right index finger infection Referring Physician: Renato Applebaum Time called: [id number] Time at bedside: 1141   Richard Carroll is an 53 y.o. male.  HPI: [name] developed what sounds like a vesicle on the dorsum of P1 of his index finger about a week ago. He squeezed it and got some clear fluid out of it. Over the course of the week it got red, painful, and swollen. He came to the ED where it sounds like he had a dorsal abscess that ruptured while they were cleaning it. He denies fevers, chills, sweats, N/V. No prior hx/o similar but has had some skin abscess in the last few years. He is ambidextrous and works as a financial risk analyst.  Past Medical History:  Diagnosis Date   Arthritis    Crohn's colitis, with rectal bleeding (HCC) 10/22/2022   Diabetes mellitus type II    DIABETES MELLITUS, TYPE II 08/18/2009   on meds   Hay fever    with allergies   Hyperlipidemia    HYPERLIPIDEMIA 08/18/2009   on meds   Seasonal allergies     Past Surgical History:  Procedure Laterality Date   BIOPSY  10/25/2022   Procedure: BIOPSY;  Surgeon: Aneita Gwendlyn ONEIDA, MD;  Location: [facility];  Service: Gastroenterology;;   COLONOSCOPY  01/2021   COLONOSCOPY WITH PROPOFOL  N/A 10/25/2022   Procedure: COLONOSCOPY WITH PROPOFOL ;  Surgeon: Aneita Gwendlyn ONEIDA, MD;  Location: [facility];  Service: Gastroenterology;  Laterality: N/A;   FINGER SURGERY Right    ring finger   KNEE SURGERY Right 2005    Family History  Problem Relation Age of Onset   Hyperlipidemia Mother    Stroke Mother    Hypertension Mother    Diabetes Mother    Prostate cancer Father 36       2004   Colon cancer Father 58       2013   Heart disease Father    Diabetes Maternal Grandmother    Alcohol abuse Maternal Grandfather    Colon cancer Paternal Uncle    Prostate cancer Paternal Uncle    Colon polyps Neg Hx    Esophageal cancer Neg Hx    Stomach cancer Neg Hx    Rectal cancer Neg Hx     Social History:   reports that he has never smoked. His smokeless tobacco use includes chew. He reports that he does not currently use alcohol. He reports that he does not use drugs.  Allergies: No Known Allergies  Medications: I have reviewed the patient's current medications.  Results for orders placed or performed during the hospital encounter of 02/26/24 (from the past 48 hours)  Comprehensive metabolic panel     Status: Abnormal   Collection Time: 02/26/24 12:55 PM  Result Value Ref Range   Sodium 136 135 - 145 mmol/L   Potassium 3.9 3.5 - 5.1 mmol/L   Chloride 102 98 - 111 mmol/L   CO2 22 22 - 32 mmol/L   Glucose, Bld 185 (H) 70 - 99 mg/dL    Comment: Glucose reference range applies only to samples taken after fasting for at least 8 hours.   BUN 15 6 - 20 mg/dL   Creatinine, Ser 8.91 0.61 - 1.24 mg/dL   Calcium  9.9 8.9 - 10.3 mg/dL   Total Protein 7.8 6.5 - 8.1 g/dL   Albumin 4.4 3.5 - 5.0 g/dL   AST 18 15 - 41 U/L   ALT 30 0 - 44 U/L   Alkaline Phosphatase  124 38 - 126 U/L   Total Bilirubin 0.4 0.0 - 1.2 mg/dL   GFR, Estimated >39 >39 mL/min    Comment: (NOTE) Calculated using the CKD-EPI Creatinine Equation (2021)    Anion gap 12 5 - 15    Comment: Performed at [facility], [address] [place], [place] [address]  CBC with Differential     Status: Abnormal   Collection Time: 02/26/24 12:55 PM  Result Value Ref Range   WBC 12.8 (H) 4.0 - 10.5 K/uL   RBC 5.90 (H) 4.22 - 5.81 MIL/uL   Hemoglobin 18.0 (H) 13.0 - 17.0 g/dL   HCT 47.3 (H) 60.9 - 47.9 %   MCV 89.2 80.0 - 100.0 fL   MCH 30.5 26.0 - 34.0 pg   MCHC 34.2 30.0 - 36.0 g/dL   RDW 87.5 88.4 - 84.4 %   Platelets 260 150 - 400 K/uL   nRBC 0.0 0.0 - 0.2 %   Neutrophils Relative % 72 %   Neutro Abs 9.2 (H) 1.7 - 7.7 K/uL   Lymphocytes Relative 17 %   Lymphs Abs 2.2 0.7 - 4.0 K/uL   Monocytes Relative 8 %   Monocytes Absolute 1.1 (H) 0.1 - 1.0 K/uL   Eosinophils Relative 3 %   Eosinophils Absolute 0.3 0.0  - 0.5 K/uL   Basophils Relative 0 %   Basophils Absolute 0.0 0.0 - 0.1 K/uL   Immature Granulocytes 0 %   Abs Immature Granulocytes 0.05 0.00 - 0.07 K/uL    Comment: Performed at [facility], [address] [place], [place] [address]  Culture, blood (routine x 2)     Status: None (Preliminary result)   Collection Time: 02/26/24 12:55 PM   Specimen: BLOOD RIGHT ARM  Result Value Ref Range   Specimen Description BLOOD RIGHT ARM    Special Requests      BOTTLES DRAWN AEROBIC AND ANAEROBIC Blood Culture adequate volume   Culture      NO GROWTH < 12 HOURS Performed at [facility], [address] [place], [place] [address]    Report Status PENDING   I-Stat Lactic Acid     Status: None   Collection Time: 02/26/24  1:04 PM  Result Value Ref Range   Lactic Acid, Venous 1.8 0.5 - 1.9 mmol/L  Culture, blood (routine x 2)     Status: None (Preliminary result)   Collection Time: 02/26/24  1:19 PM   Specimen: BLOOD LEFT ARM  Result Value Ref Range   Specimen Description BLOOD LEFT ARM    Special Requests      BOTTLES DRAWN AEROBIC AND ANAEROBIC Blood Culture results may not be optimal due to an inadequate volume of blood received in culture bottles   Culture      NO GROWTH < 12 HOURS Performed at [facility], [address], [place], [place] [address]    Report Status PENDING   Glucose, capillary     Status: Abnormal   Collection Time: 02/26/24  4:35 PM  Result Value Ref Range   Glucose-Capillary 132 (H) 70 - 99 mg/dL    Comment: Glucose reference range applies only to samples taken after fasting for at least 8 hours.  Glucose, capillary     Status: Abnormal   Collection Time: 02/26/24 10:52 PM  Result Value Ref Range   Glucose-Capillary 176 (H) 70 - 99 mg/dL    Comment: Glucose reference range applies only to samples taken after  fasting for at least 8 hours.  HIV Antibody (routine testing w rflx)     Status: None   Collection Time: 02/27/24  3:23 AM   Result Value Ref Range   HIV Screen 4th Generation wRfx Non Reactive Non Reactive    Comment: Performed at [facility], [address], [place], [place] [address]  Basic metabolic panel     Status: Abnormal   Collection Time: 02/27/24  3:23 AM  Result Value Ref Range   Sodium 138 135 - 145 mmol/L   Potassium 3.9 3.5 - 5.1 mmol/L   Chloride 104 98 - 111 mmol/L   CO2 24 22 - 32 mmol/L   Glucose, Bld 136 (H) 70 - 99 mg/dL    Comment: Glucose reference range applies only to samples taken after fasting for at least 8 hours.   BUN 16 6 - 20 mg/dL   Creatinine, Ser 9.05 0.61 - 1.24 mg/dL   Calcium  9.0 8.9 - 10.3 mg/dL   GFR, Estimated >39 >39 mL/min    Comment: (NOTE) Calculated using the CKD-EPI Creatinine Equation (2021)    Anion gap 10 5 - 15    Comment: Performed at [facility], [address], [place], [place] [address]  CBC     Status: None   Collection Time: 02/27/24  3:23 AM  Result Value Ref Range   WBC 9.9 4.0 - 10.5 K/uL   RBC 5.27 4.22 - 5.81 MIL/uL   Hemoglobin 16.3 13.0 - 17.0 g/dL   HCT 51.5 60.9 - 47.9 %   MCV 91.8 80.0 - 100.0 fL   MCH 30.9 26.0 - 34.0 pg   MCHC 33.7 30.0 - 36.0 g/dL   RDW 87.5 88.4 - 84.4 %   Platelets 235 150 - 400 K/uL   nRBC 0.0 0.0 - 0.2 %    Comment: Performed at [facility], [address] [place], [place] [address]  Glucose, capillary     Status: Abnormal   Collection Time: 02/27/24  7:27 AM  Result Value Ref Range   Glucose-Capillary 125 (H) 70 - 99 mg/dL    Comment: Glucose reference range applies only to samples taken after fasting for at least 8 hours.  Glucose, capillary     Status: Abnormal   Collection Time: 02/27/24 11:16 AM  Result Value Ref Range   Glucose-Capillary 334 (H) 70 - 99 mg/dL    Comment: Glucose reference range applies only to samples taken after fasting for at least 8 hours.  Glucose, capillary     Status: Abnormal   Collection Time: 02/27/24 12:20 PM  Result  Value Ref Range   Glucose-Capillary 260 (H) 70 - 99 mg/dL    Comment: Glucose reference range applies only to samples taken after fasting for at least 8 hours.    DG Hand Complete Right Result Date: 02/26/2024 EXAM: 3 OR MORE VIEW(S) XRAY OF THE RIGHT HAND 02/26/2024 12:37:00 PM COMPARISON: None available. CLINICAL HISTORY: First digit swelling at the MCP. FINDINGS: BONES AND JOINTS: No acute fracture. Chronic deformity involving the tuft of the 4th distal phalanx compatible with remote injury. No joint dislocation. SOFT TISSUES: The soft tissues are unremarkable. IMPRESSION: 1. No acute osseous abnormality related to the first digit swelling at the MCP. 2. Chronic deformity involving the tuft of the 4th distal phalanx compatible with remote injury. Electronically signed by: Waddell Calk MD 02/26/2024 12:41 PM EST RP Workstation: [id number]    Review of Systems  Constitutional:  Negative  for chills, diaphoresis and fever.  HENT:  Negative for ear discharge, ear pain, hearing loss and tinnitus.   Eyes:  Negative for photophobia and pain.  Respiratory:  Negative for cough and shortness of breath.   Cardiovascular:  Negative for chest pain.  Gastrointestinal:  Negative for abdominal pain, nausea and vomiting.  Genitourinary:  Negative for dysuria, flank pain, frequency and urgency.  Musculoskeletal:  Positive for arthralgias (Right index finger). Negative for back pain, myalgias and neck pain.  Neurological:  Negative for dizziness and headaches.  Hematological:  Does not bruise/bleed easily.  Psychiatric/Behavioral:  The patient is not nervous/anxious.    Blood pressure 128/82, pulse 74, temperature 97.9 F (36.6 C), temperature source Oral, resp. rate 18, height 5' 10 (1.778 m), weight 93.8 kg, SpO2 97%. Physical Exam Constitutional:      General: He is not in acute distress.    Appearance: He is well-developed. He is not diaphoretic.  HENT:     Head: Normocephalic and atraumatic.   Eyes:     General: No scleral icterus.       Right eye: No discharge.        Left eye: No discharge.     Conjunctiva/sclera: Conjunctivae normal.  Cardiovascular:     Rate and Rhythm: Normal rate and regular rhythm.  Pulmonary:     Effort: Pulmonary effort is normal. No respiratory distress.  Musculoskeletal:     Cervical back: Normal range of motion.     Comments: Left shoulder, elbow, wrist, digits- Index finger P1 dorsal ulceration with small amt active purulence, erythematous, no fluctuance, no significant pain with PROM MCP, PIP joints, rad/uln sensation intact distally, no instability, no blocks to motion save edema  Sens  Ax/R/M/U intact  Mot   Ax/ R/ PIN/ M/ AIN/ U intact  Rad 2+  Skin:    General: Skin is warm and dry.  Neurological:     Mental Status: He is alert.  Psychiatric:        Mood and Affect: Mood normal.        Behavior: Behavior normal.     Assessment/Plan: Right index finger abscess -- No s/sx of deeper abscess or septic joint. Would treat with local wound care and IV abx. Should f/u with Dr. Agarwala next week for wound check.    Ozell DOROTHA Ned, PA-C Orthopedic Surgery [phone] 02/27/2024, 1:05 PM

## 2024-02-27 NOTE — Progress Notes (Signed)
 PROGRESS NOTE    Richard Carroll  FMW:[id number] DOB: 1970-05-12 DOA: 02/26/2024 PCP: Micheal Wolm ORN, MD    Brief Narrative:  53 year old with insulin -dependent diabetes and hyperlipidemia presented with about 1 week of blistering on the base of the right index finger, taken Bactrim  from primary care physician without improvement.  Came to the emergency room.  At the emergency room, hemodynamically stable.  WBC 12.8.  X-ray with soft tissue swelling, no foreign body.  Admitted due to significant symptoms.  Subjective: Patient seen and examined.  Complains of ongoing throbbing pain and discomfort and burning sensation.  Mother at the bedside.  Afebrile. I was able to probe and deroofed the wound with some pus expressed, cultures collected and sent to the lab. Case discussed with hand surgery who will see patient in consultation.  May need debridement.  Patient had a spontaneous neck abscess last year that was found to have MRSA infection.  Assessment & Plan:   Right index finger cellulitis and abscess: Patient with significant infection.  Spreading cellulitis. Recent history of MRSA skin infection. MRSA swab negative. On Unasyn .  Will change to vancomycin . Blood cultures negative so far.  Abscess culture collected. Continue local wound care Discussed with hand surgery for consultation as patient may need debridement.  Type 2 diabetes, uncontrolled with hyperglycemia: Known A1c more than 7.  On carb modified diet.  Resume home dose of insulin .  Will uptitrate as needed.  Hyperlipidemia: On statin.  Continue.  Crohn's disease: On mesalamine .  Stable now.    DVT prophylaxis: enoxaparin  (LOVENOX ) injection 40 mg Start: 02/26/24 1700   Code Status: Full code Family Communication: Mother at the bedside Disposition Plan: Status is: Inpatient Remains inpatient appropriate because: IV antibiotics     Consultants:  Hand surgery  Procedures:  None  Antimicrobials:   Unasyn  11/23--- 11/24 Vancomycin  11/24---     Objective: Vitals:   02/26/24 1601 02/26/24 1729 02/26/24 2250 02/27/24 0541  BP:  125/79 121/79 128/82  Pulse:  96 92 74  Resp:  18 18 18   Temp:   98 F (36.7 C) 97.9 F (36.6 C)  TempSrc: Oral  Oral Oral  SpO2:  96% 96% 97%  Weight: 93.8 kg     Height: 5' 10 (1.778 m)       Intake/Output Summary (Last 24 hours) at 02/27/2024 1144 Last data filed at 02/27/2024 1000 Gross per 24 hour  Intake 1464.8 ml  Output --  Net 1464.8 ml   Filed Weights   02/26/24 1601  Weight: 93.8 kg    Examination:  General exam: Appears calm and comfortable.  Slightly anxious. Respiratory system: Clear to auscultation. Respiratory effort normal. Cardiovascular system: S1 & S2 heard, RRR.  No pedal edema. Gastrointestinal system: Abdomen is nondistended, soft and nontender. No organomegaly or masses felt. Normal bowel sounds heard. Central nervous system: Alert and oriented. No focal neurological deficits. Extremities: Symmetric 5 x 5 power. Skin: Markedly swollen base of the right index finger with surrounding erythema, pus expression from the blister.  Restricted movement of the proximal interphalangeal joint and metacarpophalangeal joint.  Distal neurovascular status intact.        Data Reviewed: I have personally reviewed following labs and imaging studies  CBC: Recent Labs  Lab 02/26/24 1255 02/27/24 0323  WBC 12.8* 9.9  NEUTROABS 9.2*  --   HGB 18.0* 16.3  HCT 52.6* 48.4  MCV 89.2 91.8  PLT 260 235   Basic Metabolic Panel: Recent Labs  Lab 02/26/24 1255  02/27/24 0323  NA 136 138  K 3.9 3.9  CL 102 104  CO2 22 24  GLUCOSE 185* 136*  BUN 15 16  CREATININE 1.08 0.94  CALCIUM  9.9 9.0   GFR: Estimated Creatinine Clearance: 105.7 mL/min (by C-G formula based on SCr of 0.94 mg/dL). Liver Function Tests: Recent Labs  Lab 02/26/24 1255  AST 18  ALT 30  ALKPHOS 124  BILITOT 0.4  PROT 7.8  ALBUMIN 4.4   No  results for input(s): LIPASE, AMYLASE in the last 168 hours. No results for input(s): AMMONIA in the last 168 hours. Coagulation Profile: No results for input(s): INR, PROTIME in the last 168 hours. Cardiac Enzymes: No results for input(s): CKTOTAL, CKMB, CKMBINDEX, TROPONINI in the last 168 hours. BNP (last 3 results) No results for input(s): PROBNP in the last 8760 hours. HbA1C: No results for input(s): HGBA1C in the last 72 hours. CBG: Recent Labs  Lab 02/26/24 1635 02/26/24 2252 02/27/24 0727 02/27/24 1116  GLUCAP 132* 176* 125* 334*   Lipid Profile: No results for input(s): CHOL, HDL, LDLCALC, TRIG, CHOLHDL, LDLDIRECT in the last 72 hours. Thyroid Function Tests: No results for input(s): TSH, T4TOTAL, FREET4, T3FREE, THYROIDAB in the last 72 hours. Anemia Panel: No results for input(s): VITAMINB12, FOLATE, FERRITIN, TIBC, IRON, RETICCTPCT in the last 72 hours. Sepsis Labs: Recent Labs  Lab 02/26/24 1304  LATICACIDVEN 1.8    Recent Results (from the past 240 hours)  Culture, blood (routine x 2)     Status: None (Preliminary result)   Collection Time: 02/26/24 12:55 PM   Specimen: BLOOD RIGHT ARM  Result Value Ref Range Status   Specimen Description BLOOD RIGHT ARM  Final   Special Requests   Final    BOTTLES DRAWN AEROBIC AND ANAEROBIC Blood Culture adequate volume   Culture   Final    NO GROWTH < 12 HOURS Performed at [facility], [address], [place], [place] [address]    Report Status PENDING  Incomplete  Culture, blood (routine x 2)     Status: None (Preliminary result)   Collection Time: 02/26/24  1:19 PM   Specimen: BLOOD LEFT ARM  Result Value Ref Range Status   Specimen Description BLOOD LEFT ARM  Final   Special Requests   Final    BOTTLES DRAWN AEROBIC AND ANAEROBIC Blood Culture results may not be optimal due to an inadequate volume of blood received in culture bottles   Culture    Final    NO GROWTH < 12 HOURS Performed at [facility], [address] [place], [place] [address]    Report Status PENDING  Incomplete         Radiology Studies: DG Hand Complete Right Result Date: 02/26/2024 EXAM: 3 OR MORE VIEW(S) XRAY OF THE RIGHT HAND 02/26/2024 12:37:00 PM COMPARISON: None available. CLINICAL HISTORY: First digit swelling at the MCP. FINDINGS: BONES AND JOINTS: No acute fracture. Chronic deformity involving the tuft of the 4th distal phalanx compatible with remote injury. No joint dislocation. SOFT TISSUES: The soft tissues are unremarkable. IMPRESSION: 1. No acute osseous abnormality related to the first digit swelling at the MCP. 2. Chronic deformity involving the tuft of the 4th distal phalanx compatible with remote injury. Electronically signed by: Waddell Calk MD 02/26/2024 12:41 PM EST RP Workstation: [id number]        Scheduled Meds:  enoxaparin  (LOVENOX ) injection  40 mg Subcutaneous Q24H   insulin  aspart  0-15 Units Subcutaneous TID WC  insulin  aspart  0-5 Units Subcutaneous QHS   insulin  glargine-yfgn  15 Units Subcutaneous BID   mesalamine   2.4 g Oral BID WC   rosuvastatin   20 mg Oral Daily   Continuous Infusions:  ampicillin -sulbactam (UNASYN ) IV 3 g (02/27/24 0543)     LOS: 0 days      Renato Applebaum, MD [facility]

## 2024-02-27 NOTE — Progress Notes (Signed)
 [facility]  PROCEDURAL EXPEDITER PROGRESS NOTE  Patient Name: Richard Carroll  DOB:[birth year] Date of Admission: 02/26/2024  Date of Assessment:02/27/24   Transition to PO antibiotics, d/c hopefully 02/28/2024 ------------------------------------------------------------------------------------------------------------------- -----------------------------------------------------------------------------------  [facility] [employer], Ronal DELENA Bald Please contact us  directly via secure chat (search for [facility]) or by calling us  at [phone] [facility]).

## 2024-02-27 NOTE — Telephone Encounter (Signed)
 I saw him last week and started antibiotics, if it is not helping his symptoms he needs to go immediately to the urgent care or the ED -- it can spread to the joint or there may be an abscess that needs to be drained.

## 2024-02-28 ENCOUNTER — Encounter: Payer: Self-pay | Admitting: Family Medicine

## 2024-02-28 DIAGNOSIS — L0390 Cellulitis, unspecified: Secondary | ICD-10-CM | POA: Diagnosis not present

## 2024-02-28 DIAGNOSIS — A419 Sepsis, unspecified organism: Secondary | ICD-10-CM | POA: Diagnosis not present

## 2024-02-28 LAB — GLUCOSE, CAPILLARY: Glucose-Capillary: 124 mg/dL — ABNORMAL HIGH (ref 70–99)

## 2024-02-28 MED ORDER — OXYCODONE HCL 5 MG PO TABS: 5.0000 mg | ORAL_TABLET | Freq: Four times a day (QID) | ORAL | 0 refills | Status: AC | PRN

## 2024-02-28 MED ORDER — FAMOTIDINE IN NACL 20-0.9 MG/50ML-% IV SOLN
20.0000 mg | Freq: Once | INTRAVENOUS | Status: AC
  Administered 2024-02-28: 20 mg via INTRAVENOUS
  Filled 2024-02-28: qty 50

## 2024-02-28 MED ORDER — SULFAMETHOXAZOLE-TRIMETHOPRIM 800-160 MG PO TABS: 1.0000 | ORAL_TABLET | Freq: Two times a day (BID) | ORAL | 0 refills | Status: AC

## 2024-02-28 MED ORDER — CEPHALEXIN 500 MG PO CAPS: 500.0000 mg | ORAL_CAPSULE | Freq: Three times a day (TID) | ORAL | 0 refills | Status: AC

## 2024-02-28 MED ORDER — DIPHENHYDRAMINE HCL 25 MG PO CAPS
25.0000 mg | ORAL_CAPSULE | Freq: Once | ORAL | Status: AC | PRN
  Administered 2024-02-28: 25 mg via ORAL
  Filled 2024-02-28: qty 1

## 2024-02-28 NOTE — Discharge Summary (Signed)
 Physician Discharge Summary  Richard Carroll FMW:[id number] DOB: 1970-10-29 DOA: 02/26/2024  PCP: Micheal Wolm ORN, MD  Admit date: 02/26/2024 Discharge date: 02/28/2024  Admitted From: Home Disposition: Home  Recommendations for Outpatient Follow-up:  Follow up with PCP in 1-2 weeks Follow-up with surgery in 1 week  Home Health: N/A Equipment/Devices: N/A  Discharge Condition: Stable CODE STATUS: Full code Diet recommendation: Low-carb diet  Discharge summary: 53 year old with insulin -dependent type 2 diabetes and hyperlipidemia presented with 1 week of blistering on the base of the right index finger, taking Bactrim  from primary care physician without improvement so came to the ER.  In the emergency room hemodynamically stable.  WBC count 12.8.  X-ray with soft tissue swelling.  Due to significant symptoms, she was admitted to the hospital and treated initially with Unasyn  and then with vancomycin .  Seen by hand surgery who recommended oral antibiotics, local wound care and follow-up.  Right index finger cellulitis and abscess: Spontaneously drained. MRSA swab negative Treated with Unasyn  then with vancomycin .  Developed red man syndrome on second dose of vancomycin . Abscess cultures with rare gram-positive cocci Blood cultures negative Previous wound cultures from his neck abscess with MRSA. Hand surgery recommended local dressing, mobility and outpatient follow-up. Plan: Will discharge patient with continuing Bactrim  for total 10 days Added Keflex  for 7 days Local wound care Follow-up with hand surgery.  Type 2 diabetes, uncontrolled with hyperglycemia: Recently changed insulin  regimen.  Well-controlled at home as per patient.  Resume all home medications.    Discharge Diagnoses:  Principal Problem:   Sepsis due to cellulitis [facility])    Discharge Instructions  Discharge Instructions     Diet Carb Modified   Complete by: As directed    Discharge wound care:    Complete by: As directed    Wash right hand twice daily with soap and warm water. Cover with dry dressing +/- non-adherent.   Increase activity slowly   Complete by: As directed       Allergies as of 02/28/2024       Reactions   Vancomycin  Itching   Developed Red man syndrome         Medication List     STOP taking these medications    clobetasol ointment 0.05 % Commonly known as: TEMOVATE   cyclobenzaprine 10 MG tablet Commonly known as: FLEXERIL   sildenafil  100 MG tablet Commonly known as: Viagra    tobramycin  0.3 % ophthalmic solution Commonly known as: Tobrex        TAKE these medications    B-D UF III MINI PEN NEEDLES 31G X 5 MM Misc Generic drug: Insulin  Pen Needle 1 EACH BY DOES NOT APPLY ROUTE 3 (THREE) TIMES DAILY. MAY DISPENSE ANY MANUFACTURER COVERED BY PATIENT'S INSURANCE.   Blood Glucose Monitoring Suppl Devi 1 each by Does not apply route 3 (three) times daily. May dispense any manufacturer covered by patient's insurance.   cephALEXin  500 MG capsule Commonly known as: KEFLEX  Take 1 capsule (500 mg total) by mouth 3 (three) times daily for 7 days.   Contour Test test strip Generic drug: glucose blood Use as instructed   Dexcom G7 Sensor Misc CHANGE SENSOR EVERY 10 DAYS   ibuprofen 800 MG tablet Commonly known as: ADVIL Take 800 mg by mouth 3 (three) times daily.   insulin  aspart 100 UNIT/ML FlexPen Commonly known as: NOVOLOG  Inject 0-6 Units into the skin 3 (three) times daily with meals. Check Blood Glucose (BG) and inject per scale: BG <150= 0 unit;  BG 150-200= 1 unit; BG 201-250= 2 unit; BG 251-300= 3 unit; BG 301-350= 4 unit; BG 351-400= 5 unit; BG >400= 6 unit and Call Primary Care.   Jardiance  10 MG Tabs tablet Generic drug: empagliflozin  TAKE 1 TABLET BY MOUTH DAILY BEFORE BREAKFAST.   Lancet Device Misc 1 each by Does not apply route 3 (three) times daily. May dispense any manufacturer covered by patient's insurance.    Lancets Misc 1 each by Does not apply route 3 (three) times daily. Use as directed to check blood sugar. May dispense any manufacturer covered by patient's insurance and fits patient's device.   mesalamine  1.2 g EC tablet Commonly known as: LIALDA  TAKE 2 TABLETS (2.4 G TOTAL) BY MOUTH 2 (TWO) TIMES DAILY WITH A MEAL.   mupirocin  ointment 2 % Commonly known as: BACTROBAN  Apply 1 Application topically 2 (two) times daily.   ONE-A-DAY MENS PO Take 1 tablet by mouth daily.   oxyCODONE  5 MG immediate release tablet Commonly known as: Oxy IR/ROXICODONE  Take 1 tablet (5 mg total) by mouth every 6 (six) hours as needed for up to 3 days for moderate pain (pain score 4-6) or severe pain (pain score 7-10).   Rezvoglar  KwikPen 100 UNIT/ML Sopn Generic drug: Insulin  Glargine-aglr Inject 15 Units into the skin in the morning and at bedtime. What changed: Another medication with the same name was removed. Continue taking this medication, and follow the directions you see here.   rosuvastatin  20 MG tablet Commonly known as: CRESTOR  TAKE 1 TABLET BY MOUTH EVERY DAY   sulfamethoxazole -trimethoprim  800-160 MG tablet Commonly known as: BACTRIM  DS Take 1 tablet by mouth 2 (two) times daily for 3 days.   triamcinolone  cream 0.5 % Commonly known as: KENALOG  Apply 1 Application topically 2 (two) times daily. What changed:  when to take this reasons to take this   Trulicity  3 MG/0.5ML Soaj Generic drug: Dulaglutide  Inject 3 mg into the skin once a week. Notes to patient: As prescribed               Discharge Care Instructions  (From admission, onward)           Start     Ordered   02/28/24 0000  Discharge wound care:       Comments: Wash right hand twice daily with soap and warm water. Cover with dry dressing +/- non-adherent.   02/28/24 0825            Follow-up Information     Agarwala, Anshul, MD. Call.   Specialty: Orthopedic Surgery Contact information: [address] [place] [address] [phone]         Micheal Wolm ORN, MD Follow up.   Specialty: Family Medicine Contact information: [address] [place] [address] [phone]                Allergies  Allergen Reactions   Vancomycin  Itching    Developed Red man syndrome     Consultations: Hand surgery   Procedures/Studies: DG Hand Complete Right Result Date: 02/26/2024 EXAM: 3 OR MORE VIEW(S) XRAY OF THE RIGHT HAND 02/26/2024 12:37:00 PM COMPARISON: None available. CLINICAL HISTORY: First digit swelling at the MCP. FINDINGS: BONES AND JOINTS: No acute fracture. Chronic deformity involving the tuft of the 4th distal phalanx compatible with remote injury. No joint dislocation. SOFT TISSUES: The soft tissues are unremarkable. IMPRESSION: 1. No acute osseous abnormality related to the first digit swelling at the MCP. 2. Chronic deformity involving the tuft  of the 4th distal phalanx compatible with remote injury. Electronically signed by: Waddell Calk MD 02/26/2024 12:41 PM EST RP Workstation: [id number]   (Echo, Carotid, EGD, Colonoscopy, ERCP)    Subjective: Patient seen and examined.  Mother at the bedside.  Daughter at the bedside.  Wife on the phone.  Has moderate pain especially with mobility.  Wound has been draining.  Afebrile.  WBC count improved.   Discharge Exam: Vitals:   02/28/24 0241 02/28/24 0642  BP: 128/88 124/82  Pulse: 77 68  Resp: 18 14  Temp: 98 F (36.7 C) 98.2 F (36.8 C)  SpO2:  99%   Vitals:   02/27/24 1554 02/27/24 2105 02/28/24 0241 02/28/24 0642  BP: (!) 143/83 126/80 128/88 124/82  Pulse: 98 91 77 68  Resp: 17 18 18 14   Temp: 97.7 F (36.5 C) 98.7 F (37.1 C) 98 F (36.7 C) 98.2 F (36.8 C)  TempSrc: Oral Oral Oral Oral  SpO2: 96% 98%  99%  Weight:      Height:        General: Pt is alert, awake, not in acute distress Cardiovascular: RRR, S1/S2 +, no rubs, no gallops Respiratory: CTA bilaterally, no  wheezing, no rhonchi Abdominal: Soft, NT, ND, bowel sounds + Extremities: Ruptured blister on the dorsum of the right index finger with some surrounding erythema.  Range of motion intact.      The results of significant diagnostics from this hospitalization (including imaging, microbiology, ancillary and laboratory) are listed below for reference.     Microbiology: Recent Results (from the past 240 hours)  Culture, blood (routine x 2)     Status: None (Preliminary result)   Collection Time: 02/26/24 12:55 PM   Specimen: BLOOD RIGHT ARM  Result Value Ref Range Status   Specimen Description BLOOD RIGHT ARM  Final   Special Requests   Final    BOTTLES DRAWN AEROBIC AND ANAEROBIC Blood Culture adequate volume   Culture   Final    NO GROWTH 2 DAYS Performed at [facility], [address] [place], [place] [address]    Report Status PENDING  Incomplete  Culture, blood (routine x 2)     Status: None (Preliminary result)   Collection Time: 02/26/24  1:19 PM   Specimen: BLOOD LEFT ARM  Result Value Ref Range Status   Specimen Description BLOOD LEFT ARM  Final   Special Requests   Final    BOTTLES DRAWN AEROBIC AND ANAEROBIC Blood Culture results may not be optimal due to an inadequate volume of blood received in culture bottles   Culture   Final    NO GROWTH 2 DAYS Performed at [facility], [address] [place], [place] [address]    Report Status PENDING  Incomplete  Aerobic Culture w Gram Stain (superficial specimen)     Status: None (Preliminary result)   Collection Time: 02/27/24  9:15 AM   Specimen: Abscess  Result Value Ref Range Status   Specimen Description ABSCESS  Final   Special Requests NONE  Final   Gram Stain NO WBC SEEN RARE GRAM POSITIVE COCCI   Final   Culture   Final    CULTURE REINCUBATED FOR BETTER GROWTH Performed at [facility], [address] [place], [place] [address]    Report Status PENDING  Incomplete     Labs: BNP (last  3 results) No results for input(s): BNP in the last 8760 hours. Basic Metabolic Panel: Recent Labs  Lab 02/26/24 1255 02/27/24 0323  NA 136 138  K 3.9 3.9  CL 102 104  CO2 22 24  GLUCOSE 185* 136*  BUN 15 16  CREATININE 1.08 0.94  CALCIUM  9.9 9.0   Liver Function Tests: Recent Labs  Lab 02/26/24 1255  AST 18  ALT 30  ALKPHOS 124  BILITOT 0.4  PROT 7.8  ALBUMIN 4.4   No results for input(s): LIPASE, AMYLASE in the last 168 hours. No results for input(s): AMMONIA in the last 168 hours. CBC: Recent Labs  Lab 02/26/24 1255 02/27/24 0323  WBC 12.8* 9.9  NEUTROABS 9.2*  --   HGB 18.0* 16.3  HCT 52.6* 48.4  MCV 89.2 91.8  PLT 260 235   Cardiac Enzymes: No results for input(s): CKTOTAL, CKMB, CKMBINDEX, TROPONINI in the last 168 hours. BNP: Invalid input(s): POCBNP CBG: Recent Labs  Lab 02/27/24 1116 02/27/24 1220 02/27/24 1614 02/27/24 2106 02/28/24 0715  GLUCAP 334* 260* 221* 165* 124*   D-Dimer No results for input(s): DDIMER in the last 72 hours. Hgb A1c No results for input(s): HGBA1C in the last 72 hours. Lipid Profile No results for input(s): CHOL, HDL, LDLCALC, TRIG, CHOLHDL, LDLDIRECT in the last 72 hours. Thyroid function studies No results for input(s): TSH, T4TOTAL, T3FREE, THYROIDAB in the last 72 hours.  Invalid input(s): FREET3 Anemia work up No results for input(s): VITAMINB12, FOLATE, FERRITIN, TIBC, IRON, RETICCTPCT in the last 72 hours. Urinalysis    Component Value Date/Time   COLORURINE STRAW (A) 10/22/2022 2041   APPEARANCEUR CLEAR 10/22/2022 2041   LABSPEC 1.025 10/22/2022 2041   PHURINE 5.0 10/22/2022 2041   GLUCOSEU >=500 (A) 10/22/2022 2041   HGBUR NEGATIVE 10/22/2022 2041   BILIRUBINUR NEGATIVE 10/22/2022 2041   KETONESUR 80 (A) 10/22/2022 2041   PROTEINUR NEGATIVE 10/22/2022 2041   NITRITE NEGATIVE 10/22/2022 2041   LEUKOCYTESUR NEGATIVE 10/22/2022 2041    Sepsis Labs Recent Labs  Lab 02/26/24 1255 02/27/24 0323  WBC 12.8* 9.9   Microbiology Recent Results (from the past 240 hours)  Culture, blood (routine x 2)     Status: None (Preliminary result)   Collection Time: 02/26/24 12:55 PM   Specimen: BLOOD RIGHT ARM  Result Value Ref Range Status   Specimen Description BLOOD RIGHT ARM  Final   Special Requests   Final    BOTTLES DRAWN AEROBIC AND ANAEROBIC Blood Culture adequate volume   Culture   Final    NO GROWTH 2 DAYS Performed at [facility], [address] [place], [place] [address]    Report Status PENDING  Incomplete  Culture, blood (routine x 2)     Status: None (Preliminary result)   Collection Time: 02/26/24  1:19 PM   Specimen: BLOOD LEFT ARM  Result Value Ref Range Status   Specimen Description BLOOD LEFT ARM  Final   Special Requests   Final    BOTTLES DRAWN AEROBIC AND ANAEROBIC Blood Culture results may not be optimal due to an inadequate volume of blood received in culture bottles   Culture   Final    NO GROWTH 2 DAYS Performed at [facility], [address], [place], [place] [address]    Report Status PENDING  Incomplete  Aerobic Culture w Gram Stain (superficial specimen)     Status: None (Preliminary result)   Collection Time: 02/27/24  9:15 AM   Specimen: Abscess  Result Value Ref Range Status   Specimen Description ABSCESS  Final   Special Requests  NONE  Final   Gram Stain NO WBC SEEN RARE GRAM POSITIVE COCCI   Final   Culture   Final    CULTURE REINCUBATED FOR BETTER GROWTH Performed at [facility], [address], [place], [place] [address]    Report Status PENDING  Incomplete     Time coordinating discharge: 35 minutes  SIGNED:   Renato Applebaum, MD  [facility] 02/28/2024, 2:13 PM

## 2024-02-28 NOTE — Progress Notes (Signed)
 Pt notified this nurse that his eyes had become itchy and his left eye swollen, as well as his groin area being itchy. No c/o chest pain or SOB. Face somewhat red, but no other areas red. VSS. Pt in the middle of receiving his second dose of IV Vancomycin . Stopped the infusion and notified Lynwood Kipper, NP. New orders to stop the infusion completely. Administered IV Pepcid  and gave pt some PO benadryl . Call light within reach and will continue to monitor.

## 2024-02-28 NOTE — Progress Notes (Signed)
   02/28/24 0809  TOC Brief Assessment  Insurance and Status Reviewed  Patient has primary care physician Yes  Home environment has been reviewed home with spouse  Prior level of function: independent  Prior/Current Home Services No current home services  Social Drivers of Health Review SDOH reviewed no interventions necessary  Readmission risk has been reviewed Yes  Transition of care needs no transition of care needs at this time

## 2024-02-29 ENCOUNTER — Telehealth: Payer: Self-pay

## 2024-02-29 ENCOUNTER — Telehealth: Payer: Self-pay | Admitting: Orthopedic Surgery

## 2024-02-29 NOTE — Telephone Encounter (Signed)
Scheduled 12/2

## 2024-02-29 NOTE — Transitions of Care (Post Inpatient/ED Visit) (Signed)
 02/29/2024  Name: Richard Carroll MRN: [id number] DOB: January 21, 1971  Today's TOC FU Call Status: Today's TOC FU Call Status:: Successful TOC FU Call Completed TOC FU Call Complete Date: 02/29/24  Patient's Name and Date of Birth confirmed. Name, DOB  Transition Care Management Follow-up Telephone Call Date of Discharge: 02/28/24 Discharge Facility: [facility]) Type of Discharge: Inpatient Admission Primary Inpatient Discharge Diagnosis:: Sepsis due to cellulitis How have you been since you were released from the hospital?: Better Any questions or concerns?: No  Items Reviewed: Did you receive and understand the discharge instructions provided?: Yes Medications obtained,verified, and reconciled?: Yes (Medications Reviewed) Any new allergies since your discharge?: No Dietary orders reviewed?: NA (tries to follow at DM diet.) Do you have support at home?: Yes People in Home [RPT]: spouse  Medications Reviewed Today: Medications Reviewed Today     Reviewed by Rumalda Alan PENNER, RN (Registered Nurse) on 02/29/24 at 1229  Med List Status: <None>   Medication Order Taking? Sig Documenting Provider Last Dose Status Informant  Blood Glucose Monitoring Suppl DEVI [id number] Yes 1 each by Does not apply route 3 (three) times daily. May dispense any manufacturer covered by patient's insurance. Arlice Reichert, MD  Active Self, Spouse/Significant Other, Pharmacy Records  cephALEXin  (KEFLEX ) 500 MG capsule [id number] Yes Take 1 capsule (500 mg total) by mouth 3 (three) times daily for 7 days. Raenelle Coria, MD  Active   Continuous Glucose Sensor (DEXCOM G7 SENSOR) [place] [id number]  CHANGE SENSOR EVERY 10 DAYS Burchette, Wolm ORN, MD  Active Self, Spouse/Significant Other, Pharmacy Records  Dulaglutide  (TRULICITY ) 3 MG/0.5ML EMMANUEL [id number] Yes Inject 3 mg into the skin once a week. Micheal Wolm ORN, MD  Active Self, Spouse/Significant Other, Pharmacy Records  empagliflozin  (JARDIANCE ) 10 MG  TABS tablet [id number] Yes TAKE 1 TABLET BY MOUTH DAILY BEFORE BREAKFAST. Micheal Wolm ORN, MD  Active Self, Spouse/Significant Other, Pharmacy Records  glucose blood (CONTOUR TEST) test strip [id number] Yes Use as instructed Arlice Reichert, MD  Active Self, Spouse/Significant Other, Pharmacy Records  ibuprofen (ADVIL) 800 MG tablet [id number] Yes Take 800 mg by mouth 3 (three) times daily. [provider]  Active Self, Spouse/Significant Other, Pharmacy Records  insulin  aspart (NOVOLOG ) 100 UNIT/ML FlexPen [id number] Yes Inject 0-6 Units into the skin 3 (three) times daily with meals. Check Blood Glucose (BG) and inject per scale: BG <150= 0 unit; BG 150-200= 1 unit; BG 201-250= 2 unit; BG 251-300= 3 unit; BG 301-350= 4 unit; BG 351-400= 5 unit; BG >400= 6 unit and Call Primary Care. Micheal Wolm ORN, MD  Active Self, Spouse/Significant Other, Pharmacy Records           Med Note RENNIS, DUROJAHYE' R   Sun Feb 26, 2024  2:23 PM) 8 Units today  Insulin  Glargine-aglr (REZVOGLAR  [facility]) 100 UNIT/ML SOPN [id number] Yes Inject 15 Units into the skin in the morning and at bedtime. Burchette, Wolm ORN, MD  Active Self, Spouse/Significant Other, Pharmacy Records  Insulin  Pen Needle (B-D UF III MINI PEN NEEDLES) 31G X 5 MM MISC [id number] Yes 1 EACH BY DOES NOT APPLY ROUTE 3 (THREE) TIMES DAILY. MAY DISPENSE ANY MANUFACTURER COVERED BY PATIENT'S INSURANCE. Micheal Wolm ORN, MD  Active Self, Spouse/Significant Other, Pharmacy Records  Lancet Device MISC [id number] Yes 1 each by Does not apply route 3 (three) times daily. May dispense any manufacturer covered by patient's insurance. Arlice Reichert, MD  Active Self, Spouse/Significant Other, Pharmacy Records  Lancets MISC [id number] Yes 1 each by Does not  apply route 3 (three) times daily. Use as directed to check blood sugar. May dispense any manufacturer covered by patient's insurance and fits patient's device. Arlice Reichert, MD  Active Self,  Spouse/Significant Other, Pharmacy Records  mesalamine  (LIALDA ) 1.2 g EC tablet [id number] Yes TAKE 2 TABLETS (2.4 G TOTAL) BY MOUTH 2 (TWO) TIMES DAILY WITH A MEAL. Cirigliano, Vito V, DO  Active Self, Spouse/Significant Other, Pharmacy Records  Multiple Vitamin (ONE-A-DAY MENS PO) [id number] Yes Take 1 tablet by mouth daily. [provider]  Active Self, Pharmacy Records, Spouse/Significant Other  mupirocin  ointment (BACTROBAN ) 2 % [id number] Yes Apply 1 Application topically 2 (two) times daily. Micheal Wolm ORN, MD  Active Self, Spouse/Significant Other, Pharmacy Records  oxyCODONE  (OXY IR/ROXICODONE ) 5 MG immediate release tablet [id number] Yes Take 1 tablet (5 mg total) by mouth every 6 (six) hours as needed for up to 3 days for moderate pain (pain score 4-6) or severe pain (pain score 7-10). Raenelle Coria, MD  Active   rosuvastatin  (CRESTOR ) 20 MG tablet [id number] Yes TAKE 1 TABLET BY MOUTH EVERY DAY Burchette, Wolm ORN, MD  Active Self, Spouse/Significant Other, Pharmacy Records  sulfamethoxazole -trimethoprim  (BACTRIM  DS) 800-160 MG tablet [id number] Yes Take 1 tablet by mouth 2 (two) times daily for 3 days. Raenelle Coria, MD  Active   triamcinolone  cream (KENALOG ) 0.5 % [id number] Yes Apply 1 Application topically 2 (two) times daily. Micheal Wolm ORN, MD  Active Self, Spouse/Significant Other, Pharmacy Records            Home Care and Equipment/Supplies: Were Home Health Services Ordered?: No Any new equipment or medical supplies ordered?: No  Functional Questionnaire: Do you need assistance with bathing/showering or dressing?: Yes (wife) Do you need assistance with meal preparation?: Yes (wife) Do you need assistance with eating?: No Do you have difficulty maintaining continence: No Do you need assistance with getting out of bed/getting out of a chair/moving?: No Do you have difficulty managing or taking your medications?: No  Follow up appointments reviewed: PCP  Follow-up appointment confirmed?: Yes Date of PCP follow-up appointment?: 03/05/24 Follow-up Provider: PCP Specialist Hospital Follow-up appointment confirmed?: No Reason Specialist Follow-Up Not Confirmed: Patient has Specialist Provider Number and will Call for Appointment Do you need transportation to your follow-up appointment?: No Do you understand care options if your condition(s) worsen?: Yes-patient verbalized understanding  SDOH Interventions Today    Flowsheet Row Most Recent Value  SDOH Interventions   Food Insecurity Interventions Intervention Not Indicated  Housing Interventions Intervention Not Indicated  Transportation Interventions Intervention Not Indicated  Utilities Interventions Intervention Not Indicated   Placed call to patient and reviewed reason for call.  Patient reports that he is doing ok. Reports soaking his hand today and taking his medications as prescribed.  Reviewed importance of taking all  of his antibiotics and taking them with food.  Reviewed wound care. Reviewed and assessed pain.  Reviewed importance of calling ortho as directed. Provided contact phone number and referred patient back to AVS page1.  Reviewed and offered 30 day TOC program and patient declined. Provided my contact information if patient needs to call me back. Confirms he has an appointment with PCP and transportation.  Reviewed signs of infection and when to call MD.   Alan Ee, RN, BSN, CEN Population Health- Transition of Care Team.  [facility] [phone]

## 2024-02-29 NOTE — Telephone Encounter (Signed)
 Patient called and coming from the ER, needs an appointment to f/u. CB#[phone]

## 2024-03-01 LAB — AEROBIC CULTURE W GRAM STAIN (SUPERFICIAL SPECIMEN): Gram Stain: NONE SEEN

## 2024-03-02 LAB — CULTURE, BLOOD (ROUTINE X 2)
Culture: NO GROWTH
Culture: NO GROWTH
Special Requests: ADEQUATE

## 2024-03-05 ENCOUNTER — Ambulatory Visit: Admitting: Family Medicine

## 2024-03-05 VITALS — BP 106/70 | HR 97 | Temp 98.1°F | Ht 70.0 in | Wt 210.0 lb

## 2024-03-05 DIAGNOSIS — L853 Xerosis cutis: Secondary | ICD-10-CM | POA: Diagnosis not present

## 2024-03-05 DIAGNOSIS — L03011 Cellulitis of right finger: Secondary | ICD-10-CM | POA: Diagnosis not present

## 2024-03-05 MED ORDER — DOXYCYCLINE HYCLATE 100 MG PO CAPS: 100.0000 mg | ORAL_CAPSULE | Freq: Two times a day (BID) | ORAL | 0 refills | Status: DC

## 2024-03-05 NOTE — Progress Notes (Signed)
 Established Patient Office Visit  Subjective   Patient ID: Richard Carroll, male    DOB: 1970/10/30  Age: 53 y.o. MRN: [id number]  Chief Complaint  Patient presents with   Hospitalization Follow-up    Pt is follow up from hospital for sepsis on Right index. Pt reports it looks better and has appt with Hand Surgeon tomorrow.    Skin Discoloration    Pt reports skin discoloration on feet last week. Wife is concern as pt is DM   Pruritis    Pt c/o sometimes itchy of his back, with tiny bump. Would like provider to take a look at it. Noticed it few days ago.     HPI   [name] is seen for hospital follow-up.  He has history of type 2 diabetes and Crohn's disease.  He states approximately 2 weeks ago he developed a small pimple proximal right index finger.  This became more red over subsequent days and he was seen here on November 21 and started on Septra  DS.  Unfortunately, he has some progressive swelling and redness over the weekend and by Sunday went in for admission.  Blood culture came back negative.  Initial concern was for sepsis.  His wound culture did grow back staph aureus    he was initially covered with Unasyn  and vancomycin .  After second dose of vancomycin  he developed red man syndrome.  Was discharged on Keflex  and Septra .  Wound culture came back resistant to Septra  as well as clindamycin.  This was susceptible to tetracycline.  Overall, improved but still have some swelling and mild redness involving right index finger proximally.  He was seen in consultation by hand surgeon and no indication for I&D.  He denies any fevers or chills.  Overall swelling is gone down substantially.  He also mentions possible eczema on his back.  He has area of dry skin mid lower back.  Not using any moisturizers currently.  Past Medical History:  Diagnosis Date   Arthritis    Crohn's colitis, with rectal bleeding (HCC) 10/22/2022   Diabetes mellitus type II    DIABETES MELLITUS, TYPE II  08/18/2009   on meds   Hay fever    with allergies   Hyperlipidemia    HYPERLIPIDEMIA 08/18/2009   on meds   Seasonal allergies    Past Surgical History:  Procedure Laterality Date   BIOPSY  10/25/2022   Procedure: BIOPSY;  Surgeon: Aneita Gwendlyn ONEIDA, MD;  Location: [facility];  Service: Gastroenterology;;   COLONOSCOPY  01/2021   COLONOSCOPY WITH PROPOFOL  N/A 10/25/2022   Procedure: COLONOSCOPY WITH PROPOFOL ;  Surgeon: Aneita Gwendlyn ONEIDA, MD;  Location: [facility];  Service: Gastroenterology;  Laterality: N/A;   FINGER SURGERY Right    ring finger   KNEE SURGERY Right 2005    reports that he has never smoked. His smokeless tobacco use includes chew. He reports that he does not currently use alcohol. He reports that he does not use drugs. family history includes Alcohol abuse in his maternal grandfather; Colon cancer in his paternal uncle; Colon cancer (age of onset: 71) in his father; Diabetes in his maternal grandmother and mother; Heart disease in his father; Hyperlipidemia in his mother; Hypertension in his mother; Prostate cancer in his paternal uncle; Prostate cancer (age of onset: 78) in his father; Stroke in his mother. Allergies  Allergen Reactions   Vancomycin  Itching    Developed Red man syndrome      Review of Systems  Constitutional:  Negative for chills and fever.  Respiratory:  Negative for shortness of breath.   Cardiovascular:  Negative for chest pain.  Gastrointestinal:  Negative for nausea and vomiting.      Objective:     BP 106/70 (BP Location: Right Arm, Patient Position: Sitting, Cuff Size: Large)   Pulse 97   Temp 98.1 F (36.7 C) (Oral)   Ht 5' 10 (1.778 m)   Wt 210 lb (95.3 kg)   SpO2 97%   BMI 30.13 kg/m    Physical Exam Vitals reviewed.  Constitutional:      General: He is not in acute distress.    Appearance: He is not ill-appearing.  Cardiovascular:     Rate and Rhythm: Normal rate and regular rhythm.  Pulmonary:     Effort:  Pulmonary effort is normal.     Breath sounds: Normal breath sounds.  Skin:    Comments: He has some general dryness mid lower back lower lumbar region.  Right index finger reveals some erythema and mild swelling involving proximal dorsal aspect of index finger extending toward MCP joint.  Full range of motion.  No fluctuance.  No warmth.  Nontender.  Neurological:     Mental Status: He is alert.      No results found for any visits on 03/05/24.    The ASCVD Risk score (Arnett DK, et al., 2019) failed to calculate for the following reasons:   The valid total cholesterol range is 130 to 320 mg/dL    Assessment & Plan:   #1 recent cellulitis/abscess right index finger.  Treated with broad-spectrum antibiotics with Unasyn  but developed reaction to vancomycin .  Was discharged on Septra  and Keflex  but culture grew out Staphylococcus aureus resistant to Septra .  Overall, improved.  His blood cultures were negative.  He still has some residual redness and we recommended 1 week of doxycycline  100 mg twice daily.  Set up 1 week follow-up.  Work extension given out through December 9.  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  Recommend liberal use of moisturizer especially after bathing.   Return in about 1 week (around 03/12/2024).    Wolm Scarlet, MD

## 2024-03-05 NOTE — Patient Instructions (Addendum)
 Start the Doxycycline  100 mg twice daily  Set up one week follow up.    Continue with warm soap water soaks twice daily.

## 2024-03-06 ENCOUNTER — Ambulatory Visit: Admitting: Orthopedic Surgery

## 2024-03-06 DIAGNOSIS — M25511 Pain in right shoulder: Secondary | ICD-10-CM | POA: Diagnosis not present

## 2024-03-06 NOTE — Progress Notes (Unsigned)
 Richard Carroll - 53 y.o. male MRN [id number]  Date of birth: 1970-10-17  Office Visit Note: Visit Date: 03/06/2024 PCP: Micheal Wolm ORN, MD Referred by: Micheal Wolm ORN, MD  Subjective: No chief complaint on file.  HPI: Richard Carroll is a pleasant 53 y.o. male who presents today for follow-up of ongoing right index finger cellulitis.  He did have a previous mass or collection in this area a few weeks prior, which he self drained with some ongoing purulence.  Was seen in the emergency department setting and subsequent admitted to the hospital for IV antibiotics and cultures.  Has been placed on appropriate antibiotics by his PCP.  Pain is controlled, erythema has improved as has swelling.  Pertinent ROS were reviewed with the patient and found to be negative unless otherwise specified above in HPI.   Visit Reason: right index finger cellulitis Duration of symptoms: 02/22/24 Hand dominance: right Occupation: [employer] Diabetic: Yes, 7.8 Smoking: No- dips Heart/Lung History: hx of DVT Blood Thinners: none  Prior Testing/EMG: xr Injections (Date): none Treatments: abx Prior Surgery: none    Assessment & Plan: Visit Diagnoses: No diagnosis found.  Plan: Extensive discussion was had the patient today regarding his right index finger cellulitis.  I do not see any focal fluid collections or anything that would warrant surgical intervention at this time.  This does appear to be cellulitic in nature, please see that he is on appropriate antibiotic regimen based on his culture sensitivities taken in the hospital setting.  He states he has follow-up with his PCP next week for a wound check which is appropriate.  He is welcome to return to me as needed in the future for progressive symptoms, increasing erythema, fluid collection or increasing drainage.  He expressed full understanding.  Of note, he did mention that he has ongoing right shoulder difficulties from a prior  injury.  Was seen initially by work comp in the outpatient setting and has been subsequently cleared.  He would like to obtain an additional opinion on his right shoulder.  Referral was placed today to my partner Dr. Genelle for additional evaluation.  Follow-up: No follow-ups on file.   Meds & Orders: No orders of the defined types were placed in this encounter.  No orders of the defined types were placed in this encounter.    Procedures: No procedures performed      Clinical History: No specialty comments available.  He reports that he has never smoked. His smokeless tobacco use includes chew.  Recent Labs    05/24/23 0846 08/23/23 0813 01/03/24 0825  HGBA1C 7.8* 8.3* 7.8*    Objective:   Vital Signs: There were no vitals taken for this visit.  Physical Exam  Gen: Well-appearing, in no acute distress; non-toxic CV: Regular Rate. Well-perfused. Warm.  Resp: Breathing unlabored on room air; no wheezing. Psych: Fluid speech in conversation; appropriate affect; normal thought process  Ortho Exam Right index finger with well-healing dorsal wound, mild erythema is present over the P1 region, no significant extension into the dorsal aspect of the hand or forearm, no streaking up the forearm, range of motion is well-preserved without significant pain or restriction, sensation intact distally, hand remains warm well-perfused   Imaging: No results found.  Past Medical/Family/Surgical/Social History: Medications & Allergies reviewed per EMR, new medications updated. Patient Active Problem List   Diagnosis Date Noted   Sepsis due to cellulitis (HCC) 02/26/2024   Acute deep vein thrombosis (DVT) of femoral vein  of right lower extremity (HCC) 12/21/2022   Bloody diarrhea 10/25/2022   Diabetic ketoacidosis associated with type 2 diabetes mellitus (HCC) 10/22/2022   Crohn's colitis, with rectal bleeding (HCC) 10/22/2022   Acute sinus infection 07/09/2014   Seborrheic dermatitis  06/16/2013   Obesity (BMI 30-39.9) 12/16/2012   Chronic eczema 12/15/2012   Allergic rhinitis 12/18/2010   Type 2 diabetes mellitus with hyperglycemia (HCC) 08/18/2009   Hyperlipidemia associated with type 2 diabetes mellitus (HCC) 08/18/2009   Past Medical History:  Diagnosis Date   Arthritis    Crohn's colitis, with rectal bleeding (HCC) 10/22/2022   Diabetes mellitus type II    DIABETES MELLITUS, TYPE II 08/18/2009   on meds   Hay fever    with allergies   Hyperlipidemia    HYPERLIPIDEMIA 08/18/2009   on meds   Seasonal allergies    Family History  Problem Relation Age of Onset   Hyperlipidemia Mother    Stroke Mother    Hypertension Mother    Diabetes Mother    Prostate cancer Father 33       2004   Colon cancer Father 58       2013   Heart disease Father    Diabetes Maternal Grandmother    Alcohol abuse Maternal Grandfather    Colon cancer Paternal Uncle    Prostate cancer Paternal Uncle    Colon polyps Neg Hx    Esophageal cancer Neg Hx    Stomach cancer Neg Hx    Rectal cancer Neg Hx    Past Surgical History:  Procedure Laterality Date   BIOPSY  10/25/2022   Procedure: BIOPSY;  Surgeon: Aneita Gwendlyn DASEN, MD;  Location: [facility];  Service: Gastroenterology;;   COLONOSCOPY  01/2021   COLONOSCOPY WITH PROPOFOL  N/A 10/25/2022   Procedure: COLONOSCOPY WITH PROPOFOL ;  Surgeon: Aneita Gwendlyn DASEN, MD;  Location: [facility];  Service: Gastroenterology;  Laterality: N/A;   FINGER SURGERY Right    ring finger   KNEE SURGERY Right 2005   Social History   Occupational History   Not on file  Tobacco Use   Smoking status: Never   Smokeless tobacco: Current    Types: Chew  Vaping Use   Vaping status: Never Used  Substance and Sexual Activity   Alcohol use: Not Currently   Drug use: No   Sexual activity: Not on file    Luisdavid Hamblin Afton Alderton, M.D. [facility], Hand Surgery

## 2024-03-07 ENCOUNTER — Encounter: Payer: Self-pay | Admitting: Family Medicine

## 2024-03-09 ENCOUNTER — Other Ambulatory Visit: Payer: Self-pay | Admitting: Gastroenterology

## 2024-03-12 ENCOUNTER — Ambulatory Visit: Admitting: Family Medicine

## 2024-03-12 ENCOUNTER — Encounter: Payer: Self-pay | Admitting: Family Medicine

## 2024-03-12 VITALS — BP 128/66 | HR 111 | Temp 98.4°F | Wt 213.0 lb

## 2024-03-12 DIAGNOSIS — L03011 Cellulitis of right finger: Secondary | ICD-10-CM

## 2024-03-12 NOTE — Progress Notes (Signed)
 Established Patient Office Visit  Subjective   Patient ID: Richard Carroll, male    DOB: 07-27-1970  Age: 53 y.o. MRN: [id number]  Chief Complaint  Patient presents with   Medical Management of Chronic Issues    HPI   [name] is seen for follow-up regarding cellulitis/abscess right index finger.  Refer to last note for details.  He had recent admission for this.  Wound culture grew out Staph aureus resistant to Septra .  We started doxycycline  100 mg twice daily for a week 1 week ago.  He actually saw orthopedic surgeon the day after his last visit here and they concurred with continued antibiotics and no further intervention at this time.  He has slowly improved.  Less erythema.  Less swelling.  Less pain.  Still slightly sensitive.  No fevers or chills.  Does have type 2 diabetes suboptimally controlled with recent A1c 7.8%.  Has scheduled diabetic follow-up in about 3 weeks.  Past Medical History:  Diagnosis Date   Arthritis    Crohn's colitis, with rectal bleeding (HCC) 10/22/2022   Diabetes mellitus type II    DIABETES MELLITUS, TYPE II 08/18/2009   on meds   Hay fever    with allergies   Hyperlipidemia    HYPERLIPIDEMIA 08/18/2009   on meds   Seasonal allergies    Past Surgical History:  Procedure Laterality Date   BIOPSY  10/25/2022   Procedure: BIOPSY;  Surgeon: Aneita Gwendlyn ONEIDA, MD;  Location: [facility];  Service: Gastroenterology;;   COLONOSCOPY  01/2021   COLONOSCOPY WITH PROPOFOL  N/A 10/25/2022   Procedure: COLONOSCOPY WITH PROPOFOL ;  Surgeon: Aneita Gwendlyn ONEIDA, MD;  Location: [facility];  Service: Gastroenterology;  Laterality: N/A;   FINGER SURGERY Right    ring finger   KNEE SURGERY Right 2005    reports that he has never smoked. His smokeless tobacco use includes chew. He reports that he does not currently use alcohol. He reports that he does not use drugs. family history includes Alcohol abuse in his maternal grandfather; Colon cancer in his paternal  uncle; Colon cancer (age of onset: 15) in his father; Diabetes in his maternal grandmother and mother; Heart disease in his father; Hyperlipidemia in his mother; Hypertension in his mother; Prostate cancer in his paternal uncle; Prostate cancer (age of onset: 16) in his father; Stroke in his mother. Allergies  Allergen Reactions   Vancomycin  Itching    Developed Red man syndrome     Review of Systems  Constitutional:  Negative for chills and fever.      Objective:     BP 128/66   Pulse (!) 111   Temp 98.4 F (36.9 C) (Oral)   Wt 213 lb (96.6 kg)   SpO2 97%   BMI 30.56 kg/m  BP Readings from Last 3 Encounters:  03/12/24 128/66  03/05/24 106/70  02/28/24 124/82   Wt Readings from Last 3 Encounters:  03/12/24 213 lb (96.6 kg)  03/05/24 210 lb (95.3 kg)  02/26/24 206 lb 11.2 oz (93.8 kg)      Physical Exam Vitals reviewed.  Constitutional:      Appearance: Normal appearance.  Cardiovascular:     Rate and Rhythm: Normal rate and regular rhythm.  Pulmonary:     Effort: Pulmonary effort is normal.     Breath sounds: Normal breath sounds.  Skin:    Comments: Right index finger reveals some mild erythema between the MCP and PIP joint.  Full range of motion all joints.  No fluctuance.  No warmth.  Nontender to palpation.  Neurological:     Mental Status: He is alert.      No results found for any visits on 03/12/24.    The ASCVD Risk score (Arnett DK, et al., 2019) failed to calculate for the following reasons:   The valid total cholesterol range is 130 to 320 mg/dL    Assessment & Plan:   Problem List Items Addressed This Visit   None Visit Diagnoses       Cellulitis of finger of right hand    -  Primary     Resolving cellulitis right index finger.  No indication for further antibiotics at this time.  We wrote him to return to regular duty unrestricted on 03-13-2024.  Watch closely for any recurrent swelling, warmth, or erythema  No follow-ups on file.     Wolm Scarlet, MD

## 2024-03-12 NOTE — Patient Instructions (Signed)
 Follow up for any progressive finger redness, warmth, or swelling.

## 2024-04-03 ENCOUNTER — Ambulatory Visit (INDEPENDENT_AMBULATORY_CARE_PROVIDER_SITE_OTHER): Admitting: Family Medicine

## 2024-04-03 ENCOUNTER — Encounter: Payer: Self-pay | Admitting: Family Medicine

## 2024-04-03 VITALS — BP 130/66 | HR 86 | Temp 98.3°F | Wt 217.0 lb

## 2024-04-03 DIAGNOSIS — Z7985 Long-term (current) use of injectable non-insulin antidiabetic drugs: Secondary | ICD-10-CM | POA: Diagnosis not present

## 2024-04-03 DIAGNOSIS — E785 Hyperlipidemia, unspecified: Secondary | ICD-10-CM

## 2024-04-03 DIAGNOSIS — E1165 Type 2 diabetes mellitus with hyperglycemia: Secondary | ICD-10-CM

## 2024-04-03 DIAGNOSIS — E1169 Type 2 diabetes mellitus with other specified complication: Secondary | ICD-10-CM

## 2024-04-03 LAB — POCT GLYCOSYLATED HEMOGLOBIN (HGB A1C): Hemoglobin A1C: 8.4 % — AB (ref 4.0–5.6)

## 2024-04-03 MED ORDER — TIRZEPATIDE 2.5 MG/0.5ML ~~LOC~~ SOAJ: 2.5000 mg | SUBCUTANEOUS | 1 refills | Status: DC

## 2024-04-03 NOTE — Progress Notes (Signed)
 "  Established Patient Office Visit  Subjective   Patient ID: Richard Carroll, male    DOB: 09-08-1970  Age: 53 y.o. MRN: [id number]  Chief Complaint  Patient presents with   Medical Management of Chronic Issues    HPI   Richard Carroll is seen for medical follow-up.  He has medical problems include history of Crohn's colitis, type 2 diabetes, hyperlipidemia, obesity, history of DVT.  He had recent infection with paronychia which may have exacerbated blood sugar somewhat.  Last visit his A1c was 7.8%.  He was taking his NovoLog  after meals and we suggested taking this before but he still taking this afterwards.  We did recently increase his Basaglar  to 15 units twice daily.  He ran out of Trulicity  about a week ago.  He states his pharmacy did not have any supply.  He is taking Jardiance  regularly.  He has Dexcom monitor and states his A1c should be around 7.9-8 based on his recent home readings.  Remains on rosuvastatin  20 mg daily for hyperlipidemia.  No myalgias.  Past Medical History:  Diagnosis Date   Arthritis    Crohn's colitis, with rectal bleeding (HCC) 10/22/2022   Diabetes mellitus type II    DIABETES MELLITUS, TYPE II 08/18/2009   on meds   Hay fever    with allergies   Hyperlipidemia    HYPERLIPIDEMIA 08/18/2009   on meds   Seasonal allergies    Past Surgical History:  Procedure Laterality Date   BIOPSY  10/25/2022   Procedure: BIOPSY;  Surgeon: Aneita Gwendlyn ONEIDA, MD;  Location: [facility];  Service: Gastroenterology;;   COLONOSCOPY  01/2021   COLONOSCOPY WITH PROPOFOL  N/A 10/25/2022   Procedure: COLONOSCOPY WITH PROPOFOL ;  Surgeon: Aneita Gwendlyn ONEIDA, MD;  Location: [facility];  Service: Gastroenterology;  Laterality: N/A;   FINGER SURGERY Right    ring finger   KNEE SURGERY Right 2005    reports that he has never smoked. His smokeless tobacco use includes chew. He reports that he does not currently use alcohol. He reports that he does not use drugs. family history  includes Alcohol abuse in his maternal grandfather; Colon cancer in his paternal uncle; Colon cancer (age of onset: 64) in his father; Diabetes in his maternal grandmother and mother; Heart disease in his father; Hyperlipidemia in his mother; Hypertension in his mother; Prostate cancer in his paternal uncle; Prostate cancer (age of onset: 34) in his father; Stroke in his mother. Allergies[1]  Review of Systems  Constitutional:  Negative for malaise/fatigue.  Eyes:  Negative for blurred vision.  Respiratory:  Negative for shortness of breath.   Cardiovascular:  Negative for chest pain.  Neurological:  Negative for dizziness, weakness and headaches.      Objective:     BP 130/66   Pulse 86   Temp 98.3 F (36.8 C) (Oral)   Wt 217 lb (98.4 kg)   SpO2 97%   BMI 31.14 kg/m  BP Readings from Last 3 Encounters:  04/03/24 130/66  03/12/24 128/66  03/05/24 106/70   Wt Readings from Last 3 Encounters:  04/03/24 217 lb (98.4 kg)  03/12/24 213 lb (96.6 kg)  03/05/24 210 lb (95.3 kg)      Physical Exam Vitals reviewed.  Constitutional:      General: He is not in acute distress.    Appearance: He is well-developed. He is not ill-appearing.  Eyes:     Pupils: Pupils are equal, round, and reactive to light.  Neck:  Thyroid: No thyromegaly.  Cardiovascular:     Rate and Rhythm: Normal rate and regular rhythm.  Pulmonary:     Effort: Pulmonary effort is normal. No respiratory distress.     Breath sounds: Normal breath sounds. No wheezing or rales.  Musculoskeletal:     Cervical back: Neck supple.     Right lower leg: No edema.     Left lower leg: No edema.  Neurological:     Mental Status: He is alert and oriented to person, place, and time.      Results for orders placed or performed in visit on 04/03/24  POC HgB A1c  Result Value Ref Range   Hemoglobin A1C 8.4 (A) 4.0 - 5.6 %   HbA1c POC (<> result, manual entry)     HbA1c, POC (prediabetic range)     HbA1c, POC  (controlled diabetic range)        The ASCVD Risk score (Arnett DK, et al., 2019) failed to calculate for the following reasons:   The valid total cholesterol range is 130 to 320 mg/dL    Assessment & Plan:   #1 type 2 diabetes suboptimally controlled with A1c 8.4%.  He did have recent infection which may have exacerbated blood sugar somewhat.  He is also still taking NovoLog  after meals and not before hand.  He recently ran out of Trulicity .  All of these may be having negative effect on his blood sugar control.  We suggested the following  - Discontinue Trulicity  and start Mounjaro 2.5 mg subcutaneous once weekly.  Give feedback in 1 month if tolerating well and we will plan to titrate this to 5 mg.  This should be more potent than Trulicity  -Also strongly suggested that he take his NovoLog  prior to meals and explained rationale for doing so.  We want to try to blunt postmeal glucose spikes. -Set up 22-month follow-up to recheck A1c  #2 hyperlipidemia treated with rosuvastatin .  Recommend check fasting lipid and CMP at 79-month follow-up  Return in about 3 months (around 07/02/2024).    Wolm Scarlet, MD     [1]  Allergies Allergen Reactions   Vancomycin  Itching    Developed Red man syndrome    "

## 2024-04-03 NOTE — Patient Instructions (Signed)
 A1C today was 8.4.  STOP the Trulicity   START the Mounjaro 2.5 mg [place] once weekly and then let me know in one month so we can send in next higher dose  Start taking Novolog  PRIOR to meals.

## 2024-04-13 ENCOUNTER — Encounter (HOSPITAL_BASED_OUTPATIENT_CLINIC_OR_DEPARTMENT_OTHER): Admitting: Orthopaedic Surgery

## 2024-04-30 ENCOUNTER — Encounter: Payer: Self-pay | Admitting: Family Medicine

## 2024-05-01 MED ORDER — TIRZEPATIDE 5 MG/0.5ML ~~LOC~~ SOAJ: 5.0000 mg | SUBCUTANEOUS | 0 refills | Status: AC

## 2024-05-09 ENCOUNTER — Encounter: Payer: Self-pay | Admitting: Family Medicine

## 2024-05-10 ENCOUNTER — Other Ambulatory Visit: Payer: Self-pay | Admitting: Family Medicine

## 2024-05-11 ENCOUNTER — Encounter: Payer: Self-pay | Admitting: Family Medicine

## 2024-05-11 ENCOUNTER — Ambulatory Visit: Admitting: Family Medicine

## 2024-05-11 VITALS — BP 120/64 | HR 81 | Temp 98.7°F | Wt 213.1 lb

## 2024-05-11 DIAGNOSIS — E1165 Type 2 diabetes mellitus with hyperglycemia: Secondary | ICD-10-CM

## 2024-05-11 DIAGNOSIS — M25511 Pain in right shoulder: Secondary | ICD-10-CM

## 2024-05-11 DIAGNOSIS — L0211 Cutaneous abscess of neck: Secondary | ICD-10-CM

## 2024-05-11 MED ORDER — DOXYCYCLINE HYCLATE 100 MG PO CAPS: 100.0000 mg | ORAL_CAPSULE | Freq: Two times a day (BID) | ORAL | 0 refills | Status: AC

## 2024-05-11 NOTE — Progress Notes (Signed)
 "  Established Patient Office Visit  Subjective   Patient ID: Richard Carroll, male    DOB: 05-20-1970  Age: 54 y.o. MRN: [id number]  Chief Complaint  Patient presents with   Abscess    HPI    [name] has history of Crohn's disease, type 2 diabetes, dyslipidemia, past history of acute DVT.  He had history of recurrent staph infections in the past including recent complicated infection of the index finger.  Last A1c was suboptimally controlled at 8.4%.  Seen today with couple of potential new abscesses posterior neck region.  His wife recently cut his hair and had noticed these.  He has just mild soreness.  Denies any recent fevers or chills.  Has had cultures previously that have grown out Staph aureus.  No intolerance with doxycycline  previously.  Patient also relates ongoing right shoulder pain.  He states he had a work injury back in November.  He fell at work.  Apparently had virtual follow-up initially and was sent back to work.  He had ongoing issues and went to urgent care and states x-rays were taken which showed no acute abnormality.  He is continue to have some sharp pains in the right shoulder exacerbated by internal rotation and abduction.  He has nightly pain.  No definite weakness.  He has difficulty at work with lifting overhead.  He has pain frequently wakes him up at night.  No cervical radiculitis pain.  No prior history of shoulder issues. This was a [employer] type injury so he has been trying to get follow-up through his providers at work but has had some difficulty.  He has poorly controlled type 2 diabetes and we recently discontinued Trulicity  and started Mounjaro  hopefully for more efficacious effect.  We were able to titrate this up to 5 mg which he continues to do well with.  He has scheduled follow-up and couple months to reassess  Past Medical History:  Diagnosis Date   Arthritis    Crohn's colitis, with rectal bleeding (HCC) 10/22/2022   Diabetes mellitus  type II    DIABETES MELLITUS, TYPE II 08/18/2009   on meds   Hay fever    with allergies   Hyperlipidemia    HYPERLIPIDEMIA 08/18/2009   on meds   Seasonal allergies    Past Surgical History:  Procedure Laterality Date   BIOPSY  10/25/2022   Procedure: BIOPSY;  Surgeon: Aneita Gwendlyn ONEIDA, MD;  Location: [facility];  Service: Gastroenterology;;   COLONOSCOPY  01/2021   COLONOSCOPY WITH PROPOFOL  N/A 10/25/2022   Procedure: COLONOSCOPY WITH PROPOFOL ;  Surgeon: Aneita Gwendlyn ONEIDA, MD;  Location: [facility];  Service: Gastroenterology;  Laterality: N/A;   FINGER SURGERY Right    ring finger   KNEE SURGERY Right 2005    reports that he has never smoked. His smokeless tobacco use includes chew. He reports that he does not currently use alcohol. He reports that he does not use drugs. family history includes Alcohol abuse in his maternal grandfather; Colon cancer in his paternal uncle; Colon cancer (age of onset: 53) in his father; Diabetes in his maternal grandmother and mother; Heart disease in his father; Hyperlipidemia in his mother; Hypertension in his mother; Prostate cancer in his paternal uncle; Prostate cancer (age of onset: 3) in his father; Stroke in his mother. Allergies[1]  Review of Systems  Constitutional:  Negative for chills and fever.  Cardiovascular:  Negative for chest pain.  Musculoskeletal:  Negative for neck pain.  Objective:     BP 120/64   Pulse 81   Temp 98.7 F (37.1 C) (Oral)   Wt 213 lb 1.6 oz (96.7 kg)   SpO2 97%   BMI 30.58 kg/m  BP Readings from Last 3 Encounters:  05/11/24 120/64  04/03/24 130/66  03/12/24 128/66   Wt Readings from Last 3 Encounters:  05/11/24 213 lb 1.6 oz (96.7 kg)  04/03/24 217 lb (98.4 kg)  03/12/24 213 lb (96.6 kg)      Physical Exam Vitals reviewed.  Constitutional:      General: He is not in acute distress.    Appearance: He is not ill-appearing.  Cardiovascular:     Rate and Rhythm: Normal rate and  regular rhythm.  Pulmonary:     Effort: Pulmonary effort is normal.     Breath sounds: Normal breath sounds. No wheezing or rales.  Musculoskeletal:     Comments: Right shoulder reveals no localized tenderness.  He has pain with abduction to 90 degrees.  Also has pain with internal rotation.  Skin:    Comments: He has a couple of erythematous papules with pustular center including 1 near the midline of his posterior neck and another on the left side.  Neither are fluctuant.  With minimal pressure he had little bit of purulent drainage from one of the lesions and this was cultured.  Neurological:     Mental Status: He is alert.      No results found for any visits on 05/11/24.  Last CBC Lab Results  Component Value Date   WBC 9.9 02/27/2024   HGB 16.3 02/27/2024   HCT 48.4 02/27/2024   MCV 91.8 02/27/2024   MCH 30.9 02/27/2024   RDW 12.4 02/27/2024   PLT 235 02/27/2024   Last metabolic panel Lab Results  Component Value Date   GLUCOSE 136 (H) 02/27/2024   NA 138 02/27/2024   K 3.9 02/27/2024   CL 104 02/27/2024   CO2 24 02/27/2024   BUN 16 02/27/2024   CREATININE 0.94 02/27/2024   GFRNONAA >60 02/27/2024   CALCIUM  9.0 02/27/2024   PHOS 3.1 10/27/2022   PROT 7.8 02/26/2024   ALBUMIN 4.4 02/26/2024   BILITOT 0.4 02/26/2024   ALKPHOS 124 02/26/2024   AST 18 02/26/2024   ALT 30 02/26/2024   ANIONGAP 10 02/27/2024   Last lipids Lab Results  Component Value Date   CHOL 121 05/24/2023   HDL 45.70 05/24/2023   LDLCALC 61 05/24/2023   LDLDIRECT 113.0 01/13/2018   TRIG 69.0 05/24/2023   CHOLHDL 3 05/24/2023   Last hemoglobin A1c Lab Results  Component Value Date   HGBA1C 8.4 (A) 04/03/2024      The ASCVD Risk score (Arnett DK, et al., 2019) failed to calculate for the following reasons:   The valid total cholesterol range is 130 to 320 mg/dL    Assessment & Plan:   #1 small abscesses involving neck.  Past history of recurrent staph skin infections.  No  evidence for fluctuance on exam today.  We were able to unroof one of the lesions and he had some purulent drainage which was cultured.  We recommended warm compresses several times daily.  Start doxycycline  100 mg twice daily for 10 days.  Watch for any progressive redness, swelling, or other concerns.  #2 several month history of ongoing right shoulder pain following initial injury at work.  Had x-rays previously through urgent care which were regular plain films and unremarkable.  Sounds like he  probably has some rotator cuff tendinitis.  No evidence for adhesive capsulitis at this time.  We did mention possible referral to sports medicine but since he has been followed through [facility]. thus far we will try to follow-up with them first since this was a work related injury.  He will be in touch if he feels like he is not getting any benefit or further progression there.  We recommended against any nonsteroidals given his history of Crohn's disease.  Will try some plain Tylenol  for night pain.  #3 type 2 diabetes suboptimally controlled with recent A1c 8.4%.  He had been somewhat inconsistent with Trulicity  prior to that number and we switched him over to Mounjaro  which he is tolerating well.  He also had complex infection involving index finger which may have exacerbated his numbers somewhat.  No follow-ups on file.    Wolm Scarlet, MD     [1]  Allergies Allergen Reactions   Vancomycin  Itching    Developed Red man syndrome    "

## 2024-05-11 NOTE — Patient Instructions (Signed)
 Use warm compresses to abscess areas several times daily  Start the antibiotic twice daily  Watch for any progressive swelling, redness, or warmth.

## 2024-07-03 ENCOUNTER — Ambulatory Visit: Admitting: Family Medicine
